# Patient Record
Sex: Male | Born: 1965 | Race: White | Hispanic: No | Marital: Single | Smoking: Former smoker
Health system: Southern US, Community
[De-identification: ages and names within clinical notes are randomized; demographics above are authoritative.]

## PROBLEM LIST (undated history)

## (undated) DIAGNOSIS — M069 Rheumatoid arthritis, unspecified: Secondary | ICD-10-CM

## (undated) DIAGNOSIS — I341 Nonrheumatic mitral (valve) prolapse: Secondary | ICD-10-CM

## (undated) DIAGNOSIS — R011 Cardiac murmur, unspecified: Secondary | ICD-10-CM

## (undated) DIAGNOSIS — I340 Nonrheumatic mitral (valve) insufficiency: Secondary | ICD-10-CM

## (undated) HISTORY — PX: EYE SURGERY: SHX253

## (undated) HISTORY — PX: HERNIA REPAIR: SHX51

## (undated) HISTORY — PX: APPENDECTOMY: SHX54

## (undated) HISTORY — DX: Cardiac murmur, unspecified: R01.1

## (undated) HISTORY — DX: Nonrheumatic mitral (valve) insufficiency: I34.0

## (undated) HISTORY — DX: Rheumatoid arthritis, unspecified: M06.9

## (undated) SURGERY — Surgical Case
Anesthesia: *Unknown

---

## 2013-09-26 DIAGNOSIS — R011 Cardiac murmur, unspecified: Secondary | ICD-10-CM | POA: Insufficient documentation

## 2013-09-26 DIAGNOSIS — H1010 Acute atopic conjunctivitis, unspecified eye: Secondary | ICD-10-CM | POA: Insufficient documentation

## 2013-09-26 DIAGNOSIS — R030 Elevated blood-pressure reading, without diagnosis of hypertension: Secondary | ICD-10-CM | POA: Insufficient documentation

## 2018-11-27 ENCOUNTER — Emergency Department (HOSPITAL_COMMUNITY): Payer: BLUE CROSS/BLUE SHIELD | Admitting: Certified Registered Nurse Anesthetist

## 2018-11-27 ENCOUNTER — Observation Stay (HOSPITAL_COMMUNITY)
Admission: EM | Admit: 2018-11-27 | Discharge: 2018-11-28 | Disposition: A | Discharge status: Home / Self Care | Payer: BLUE CROSS/BLUE SHIELD | Attending: General Surgery | Admitting: General Surgery

## 2018-11-27 ENCOUNTER — Encounter (HOSPITAL_COMMUNITY): Payer: Self-pay

## 2018-11-27 ENCOUNTER — Other Ambulatory Visit: Payer: Self-pay

## 2018-11-27 ENCOUNTER — Emergency Department (HOSPITAL_COMMUNITY): Payer: BLUE CROSS/BLUE SHIELD

## 2018-11-27 ENCOUNTER — Encounter (HOSPITAL_COMMUNITY)
Admission: EM | Disposition: A | Discharge status: Home / Self Care | Payer: Self-pay | Source: Home / Self Care | Attending: Emergency Medicine

## 2018-11-27 DIAGNOSIS — I341 Nonrheumatic mitral (valve) prolapse: Secondary | ICD-10-CM | POA: Diagnosis present

## 2018-11-27 DIAGNOSIS — K3580 Unspecified acute appendicitis: Secondary | ICD-10-CM | POA: Diagnosis present

## 2018-11-27 HISTORY — DX: Nonrheumatic mitral (valve) prolapse: I34.1

## 2018-11-27 HISTORY — PX: LAPAROSCOPIC APPENDECTOMY: SHX408

## 2018-11-27 LAB — URINALYSIS, ROUTINE W REFLEX MICROSCOPIC
Bilirubin Urine: NEGATIVE
Glucose, UA: NEGATIVE mg/dL
Hgb urine dipstick: NEGATIVE
Ketones, ur: 20 mg/dL — AB
Leukocytes,Ua: NEGATIVE
Nitrite: NEGATIVE
Protein, ur: NEGATIVE mg/dL
Specific Gravity, Urine: 1.029 (ref 1.005–1.030)
pH: 7 (ref 5.0–8.0)

## 2018-11-27 LAB — CBC WITH DIFFERENTIAL/PLATELET
Abs Immature Granulocytes: 0.05 10*3/uL (ref 0.00–0.07)
Basophils Absolute: 0 10*3/uL (ref 0.0–0.1)
Basophils Relative: 0 %
Eosinophils Absolute: 0 10*3/uL (ref 0.0–0.5)
Eosinophils Relative: 0 %
HCT: 36.2 % — ABNORMAL LOW (ref 39.0–52.0)
Hemoglobin: 12.4 g/dL — ABNORMAL LOW (ref 13.0–17.0)
Immature Granulocytes: 0 %
Lymphocytes Relative: 10 %
Lymphs Abs: 1.4 10*3/uL (ref 0.7–4.0)
MCH: 31.5 pg (ref 26.0–34.0)
MCHC: 34.3 g/dL (ref 30.0–36.0)
MCV: 91.9 fL (ref 80.0–100.0)
Monocytes Absolute: 0.8 10*3/uL (ref 0.1–1.0)
Monocytes Relative: 6 %
Neutro Abs: 11.5 10*3/uL — ABNORMAL HIGH (ref 1.7–7.7)
Neutrophils Relative %: 84 %
Platelets: 322 10*3/uL (ref 150–400)
RBC: 3.94 MIL/uL — ABNORMAL LOW (ref 4.22–5.81)
RDW: 12.4 % (ref 11.5–15.5)
WBC: 13.7 10*3/uL — ABNORMAL HIGH (ref 4.0–10.5)
nRBC: 0 % (ref 0.0–0.2)

## 2018-11-27 LAB — LIPASE, BLOOD: Lipase: 29 U/L (ref 11–51)

## 2018-11-27 LAB — COMPREHENSIVE METABOLIC PANEL
ALT: 23 U/L (ref 0–44)
AST: 19 U/L (ref 15–41)
Albumin: 4.2 g/dL (ref 3.5–5.0)
Alkaline Phosphatase: 88 U/L (ref 38–126)
Anion gap: 10 (ref 5–15)
BUN: 13 mg/dL (ref 6–20)
CO2: 25 mmol/L (ref 22–32)
Calcium: 9.1 mg/dL (ref 8.9–10.3)
Chloride: 97 mmol/L — ABNORMAL LOW (ref 98–111)
Creatinine, Ser: 0.65 mg/dL (ref 0.61–1.24)
GFR calc Af Amer: >60 mL/min (ref >60)
GFR calc non Af Amer: >60 mL/min (ref >60)
Glucose, Bld: 127 mg/dL — ABNORMAL HIGH (ref 70–99)
Potassium: 3.8 mmol/L (ref 3.5–5.1)
Sodium: 132 mmol/L — ABNORMAL LOW (ref 135–145)
Total Bilirubin: 0.7 mg/dL (ref 0.3–1.2)
Total Protein: 7.3 g/dL (ref 6.5–8.1)

## 2018-11-27 LAB — LACTIC ACID, PLASMA
Lactic Acid, Venous: 1.2 mmol/L (ref 0.5–1.9)
Lactic Acid, Venous: 0.9 mmol/L (ref 0.5–1.9)

## 2018-11-27 SURGERY — APPENDECTOMY, LAPAROSCOPIC
Anesthesia: General | Site: Abdomen

## 2018-11-27 MED ORDER — SUCCINYLCHOLINE CHLORIDE 200 MG/10ML IV SOSY
PREFILLED_SYRINGE | INTRAVENOUS | Status: DC | PRN
  Administered 2018-11-27: 140 mg via INTRAVENOUS

## 2018-11-27 MED ORDER — SUCCINYLCHOLINE CHLORIDE 200 MG/10ML IV SOSY
PREFILLED_SYRINGE | INTRAVENOUS | Status: AC
  Filled 2018-11-27: qty 10

## 2018-11-27 MED ORDER — SODIUM CHLORIDE (PF) 0.9 % IJ SOLN
INTRAMUSCULAR | Status: AC
  Administered 2018-11-27: 22:00:00
  Filled 2018-11-27: qty 100

## 2018-11-27 MED ORDER — GABAPENTIN 300 MG PO CAPS
300.0000 mg | ORAL_CAPSULE | Freq: Once | ORAL | Status: AC
  Administered 2018-11-27: 300 mg via ORAL
  Filled 2018-11-27: qty 1

## 2018-11-27 MED ORDER — ONDANSETRON HCL 4 MG/2ML IJ SOLN
INTRAMUSCULAR | Status: AC
  Filled 2018-11-27: qty 2

## 2018-11-27 MED ORDER — PIPERACILLIN-TAZOBACTAM 3.375 G IVPB 30 MIN
3.3750 g | Freq: Once | INTRAVENOUS | Status: AC
  Administered 2018-11-27: 3.375 g via INTRAVENOUS
  Filled 2018-11-27: qty 50

## 2018-11-27 MED ORDER — DEXAMETHASONE SODIUM PHOSPHATE 10 MG/ML IJ SOLN
INTRAMUSCULAR | Status: AC
  Filled 2018-11-27: qty 1

## 2018-11-27 MED ORDER — HYDROMORPHONE HCL 1 MG/ML IJ SOLN
0.5000 mg | Freq: Once | INTRAMUSCULAR | Status: AC
  Administered 2018-11-27: 0.5 mg via INTRAVENOUS
  Filled 2018-11-27: qty 1

## 2018-11-27 MED ORDER — IOHEXOL 300 MG/ML  SOLN
100.0000 mL | Freq: Once | INTRAMUSCULAR | Status: AC | PRN
  Administered 2018-11-27: 100 mL via INTRAVENOUS

## 2018-11-27 MED ORDER — HYDROMORPHONE HCL 1 MG/ML IJ SOLN
1.0000 mg | Freq: Once | INTRAMUSCULAR | Status: AC
  Administered 2018-11-27: 1 mg via INTRAVENOUS
  Filled 2018-11-27: qty 1

## 2018-11-27 MED ORDER — SODIUM CHLORIDE 0.9 % IV BOLUS (SEPSIS)
1000.0000 mL | Freq: Once | INTRAVENOUS | Status: AC
  Administered 2018-11-27: 1000 mL via INTRAVENOUS

## 2018-11-27 MED ORDER — SODIUM CHLORIDE 0.9 % IV SOLN
1000.0000 mL | INTRAVENOUS | Status: DC
  Administered 2018-11-27: 1000 mL via INTRAVENOUS

## 2018-11-27 MED ORDER — LIDOCAINE 2% (20 MG/ML) 5 ML SYRINGE
INTRAMUSCULAR | Status: AC
  Filled 2018-11-27: qty 5

## 2018-11-27 MED ORDER — FENTANYL CITRATE (PF) 100 MCG/2ML IJ SOLN: 25.0000 ug | INTRAMUSCULAR | Status: DC | PRN

## 2018-11-27 MED ORDER — LACTATED RINGERS IV SOLN
INTRAVENOUS | Status: DC | PRN
  Administered 2018-11-27: 23:00:00 via INTRAVENOUS

## 2018-11-27 MED ORDER — ROCURONIUM BROMIDE 50 MG/5ML IV SOSY
PREFILLED_SYRINGE | INTRAVENOUS | Status: DC | PRN
  Administered 2018-11-27: 60 mg via INTRAVENOUS

## 2018-11-27 MED ORDER — FENTANYL CITRATE (PF) 250 MCG/5ML IJ SOLN
INTRAMUSCULAR | Status: AC
  Filled 2018-11-27: qty 5

## 2018-11-27 MED ORDER — ROCURONIUM BROMIDE 10 MG/ML (PF) SYRINGE
PREFILLED_SYRINGE | INTRAVENOUS | Status: AC
  Filled 2018-11-27: qty 10

## 2018-11-27 MED ORDER — BUPIVACAINE-EPINEPHRINE (PF) 0.25% -1:200000 IJ SOLN
INTRAMUSCULAR | Status: AC
  Filled 2018-11-27: qty 60

## 2018-11-27 MED ORDER — PROPOFOL 10 MG/ML IV BOLUS
INTRAVENOUS | Status: AC
  Filled 2018-11-27: qty 20

## 2018-11-27 MED ORDER — PROPOFOL 10 MG/ML IV BOLUS
INTRAVENOUS | Status: DC | PRN
  Administered 2018-11-27: 150 mg via INTRAVENOUS

## 2018-11-27 MED ORDER — ACETAMINOPHEN 500 MG PO TABS
1000.0000 mg | ORAL_TABLET | Freq: Once | ORAL | Status: AC
  Administered 2018-11-27: 1000 mg via ORAL
  Filled 2018-11-27: qty 2

## 2018-11-27 MED ORDER — MIDAZOLAM HCL 2 MG/2ML IJ SOLN
INTRAMUSCULAR | Status: AC
  Filled 2018-11-27: qty 2

## 2018-11-27 SURGICAL SUPPLY — 34 items
APPLIER CLIP 5 13 M/L LIGAMAX5 (MISCELLANEOUS)
CABLE HIGH FREQUENCY MONO STRZ (ELECTRODE) IMPLANT
CLIP APPLIE 5 13 M/L LIGAMAX5 (MISCELLANEOUS) IMPLANT
CLOSURE WOUND 1/2 X4 (GAUZE/BANDAGES/DRESSINGS)
COVER SURGICAL LIGHT HANDLE (MISCELLANEOUS) ×3 IMPLANT
COVER WAND RF STERILE (DRAPES) IMPLANT
CUTTER FLEX LINEAR 45M (STAPLE) IMPLANT
DECANTER SPIKE VIAL GLASS SM (MISCELLANEOUS) ×3 IMPLANT
GLOVE BIO SURGEON STRL SZ7.5 (GLOVE) ×3 IMPLANT
GLOVE BIOGEL PI IND STRL 7.5 (GLOVE) ×2 IMPLANT
GLOVE BIOGEL PI IND STRL 8 (GLOVE) ×2 IMPLANT
GLOVE BIOGEL PI INDICATOR 7.5 (GLOVE) ×4
GLOVE BIOGEL PI INDICATOR 8 (GLOVE) ×4
GLOVE INDICATOR 8.0 STRL GRN (GLOVE) ×3 IMPLANT
GLOVE SURG SS PI 8.0 STRL IVOR (GLOVE) ×6 IMPLANT
GOWN STRL REUS W/TWL XL LVL3 (GOWN DISPOSABLE) ×9 IMPLANT
GRASPER SUT TROCAR 14GX15 (MISCELLANEOUS) IMPLANT
KIT BASIN OR (CUSTOM PROCEDURE TRAY) ×3 IMPLANT
KIT TURNOVER KIT A (KITS) IMPLANT
POUCH RETRIEVAL ECOSAC 10 (ENDOMECHANICALS) ×1 IMPLANT
POUCH RETRIEVAL ECOSAC 10MM (ENDOMECHANICALS) ×2
RELOAD 45 VASCULAR/THIN (ENDOMECHANICALS) IMPLANT
RELOAD STAPLE TA45 3.5 REG BLU (ENDOMECHANICALS) IMPLANT
SET IRRIG TUBING LAPAROSCOPIC (IRRIGATION / IRRIGATOR) ×3 IMPLANT
SET TUBE SMOKE EVAC HIGH FLOW (TUBING) ×3 IMPLANT
SHEARS HARMONIC ACE PLUS 36CM (ENDOMECHANICALS) ×3 IMPLANT
SLEEVE XCEL OPT CAN 5 100 (ENDOMECHANICALS) ×3 IMPLANT
STRIP CLOSURE SKIN 1/2X4 (GAUZE/BANDAGES/DRESSINGS) IMPLANT
SUT MNCRL AB 4-0 PS2 18 (SUTURE) ×3 IMPLANT
SUT VICRYL 0 TIES 12 18 (SUTURE) IMPLANT
TOWEL OR 17X26 10 PK STRL BLUE (TOWEL DISPOSABLE) ×3 IMPLANT
TRAY LAPAROSCOPIC (CUSTOM PROCEDURE TRAY) ×3 IMPLANT
TROCAR BLADELESS OPT 5 100 (ENDOMECHANICALS) ×3 IMPLANT
TROCAR XCEL BLUNT TIP 100MML (ENDOMECHANICALS) ×3 IMPLANT

## 2018-11-27 NOTE — ED Notes (Signed)
Provided patient urinal to obtain urine sample.

## 2018-11-27 NOTE — ED Triage Notes (Addendum)
Pt stating he woke up this morning with abdominal pain. Pt has had nausea and vomiting throughout the day. No diarrhea. Pt stating pain is more significant on the right side. Took Pepto at 3 with no relief. Pt was seen at an urgent care but was unable to scan the patient so sent to ED.

## 2018-11-27 NOTE — ED Provider Notes (Signed)
[facility]-EMERGENCY DEPT Provider Note   CSN: [id number] Arrival date & time: 11/27/18  1938    History   Chief Complaint Chief Complaint  Patient presents with   Abdominal Pain   Emesis    HPI Perry Lewis is a 53 y.o. male.     HPI Patient referred from urgent care for abdominal pain.  He reports he started getting abdominal pain this morning when he awakened about 730.  First he felt kind of a general aching discomfort and some nausea.  Patient vomited.  Discomfort continued over the course of the day and became more painful with about 2 more episodes of vomiting.  He had one small bowel movement.  No diarrhea.  No relief with discomfort.  No fever.  No cough.  She reports he does have mitral valve prolapse.  He reports that it has had a couple echocardiograms and been shown to be advancing.  She reports that he does get notably short of breath if he tries to exert himself beyond normal activities.  Climbing stairs or aggressive hiking will make him short of breath and sometimes has to rest.  He denies chest pain. Past Medical History:  Diagnosis Date   Mitral valve prolapse     There are no active problems to display for this patient.   Past medical history: Mitral valve prolapse.  Surgical history: Inguinal hernia repair.      Home Medications    Prior to Admission medications   Not on File    Family History No family history on file.  Social History Social History   Tobacco Use   Smoking status: Never Smoker   Smokeless tobacco: Never Used  Substance Use Topics   Alcohol use: Yes   Drug use: Not on file     Allergies   Patient has no known allergies.   Review of Systems Review of Systems 10 Systems reviewed and are negative for acute change except as noted in the HPI.   Physical Exam Updated Vital Signs BP (!) 157/83 (BP Location: Left Arm)    Pulse 97    Temp 99.3 F (37.4 C) (Oral)    Resp 18    Ht 5\' 9"  (1.753  m)    Wt 77.1 kg    SpO2 100%    BMI 25.10 kg/m   Physical Exam Constitutional:      Appearance: He is well-developed.  HENT:     Head: Normocephalic and atraumatic.  Eyes:     Extraocular Movements: Extraocular movements intact.     Conjunctiva/sclera: Conjunctivae normal.  Neck:     Musculoskeletal: Neck supple.  Cardiovascular:     Rate and Rhythm: Normal rate and regular rhythm.     Heart sounds: Murmur present.     Comments: 3 back/6 harsh systolic murmur best over mitral valve. Pulmonary:     Effort: Pulmonary effort is normal.     Comments: No respiratory distress.  Focal pronounced inspiratory wheeze left mid lung field.  Right lung field clear. Abdominal:     General: Bowel sounds are normal. There is no distension.     Palpations: Abdomen is soft.     Tenderness: There is abdominal tenderness.     Comments: Right lower quadrant tender with guarding.  No mass fullness or tenderness in inguinal canals.  Musculoskeletal: Normal range of motion.     Comments: No calf tenderness.  Non-erythematous scattered varicosities of the lower legs.  He had acutely skinned his great  toe was superficial abrasions.  Skin:    General: Skin is warm and dry.  Neurological:     General: No focal deficit present.     Mental Status: He is alert and oriented to person, place, and time.     GCS: GCS eye subscore is 4. GCS verbal subscore is 5. GCS motor subscore is 6.     Coordination: Coordination normal.  Psychiatric:        Mood and Affect: Mood normal.      ED Treatments / Results  Labs (all labs ordered are listed, but only abnormal results are displayed) Labs Reviewed  COMPREHENSIVE METABOLIC PANEL - Abnormal; Notable for the following components:      Result Value   Sodium 132 (*)    Chloride 97 (*)    Glucose, Bld 127 (*)    All other components within normal limits  CBC WITH DIFFERENTIAL/PLATELET - Abnormal; Notable for the following components:   WBC 13.7 (*)    RBC 3.94  (*)    Hemoglobin 12.4 (*)    HCT 36.2 (*)    Neutro Abs 11.5 (*)    All other components within normal limits  LIPASE, BLOOD  LACTIC ACID, PLASMA  LACTIC ACID, PLASMA  URINALYSIS, ROUTINE W REFLEX MICROSCOPIC    EKG None  Radiology Ct Chest W Contrast  Result Date: 11/27/2018 CLINICAL DATA:  Initial evaluation for acute right lower quadrant pain for 1 day, appendicitis suspected. EXAM: CT CHEST, ABDOMEN, AND PELVIS WITH CONTRAST TECHNIQUE: Multidetector CT imaging of the chest, abdomen and pelvis was performed following the standard protocol during bolus administration of intravenous contrast. CONTRAST:  100mL OMNIPAQUE IOHEXOL 300 MG/ML  SOLN COMPARISON:  None available. FINDINGS: CT CHEST FINDINGS Cardiovascular: She intrathoracic aorta normal in caliber without aneurysm or other acute finding. Visualized great vessels within normal limits. Heart size normal. No pericardial effusion. Mild calcification noted about the mitral valve. Limited assessment of the pulmonary arterial tree grossly unremarkable. Mediastinum/Nodes: Thyroid normal. No enlarged mediastinal, hilar, or axillary lymph nodes. Esophagus within normal limits. Lungs/Pleura: Tracheobronchial tree intact and patent. Lungs well inflated bilaterally. Lungs are clear bilaterally without focal infiltrate, edema, or effusion. No pneumothorax. No worrisome pulmonary nodule or mass. Minimal subsegmental atelectatic changes present dependently within the lower lobes bilaterally. Musculoskeletal: External soft tissues within normal limits. No acute osseous finding. No discrete lytic or blastic osseous lesions. CT ABDOMEN PELVIS FINDINGS Hepatobiliary: Liver demonstrates a normal contrast enhanced appearance. Gallbladder within normal limits. No biliary dilatation. Pancreas: Pancreas within normal limits. Spleen: Spleen within normal limits. Adrenals/Urinary Tract: Adrenal glands are normal. Kidneys equal in size with symmetric enhancement. No  nephrolithiasis, hydronephrosis, or focal enhancing renal mass. No hydroureter. Partially distended bladder within normal limits. Stomach/Bowel: Stomach partially distended without acute abnormality. No evidence for bowel obstruction. Appendix: Location: Appendix extends inferiorly from the cecum (series 5, image 59). Diameter: Measures up to 12 mm in diameter. Appendicolith: Possible small appendicolith at the base of the appendix versus enteric contrast (series 5, image 66). Mucosal hyper-enhancement: Present Extraluminal gas: Negative. Periappendiceal collection: Periappendiceal inflammatory stranding with phlegmon without discrete abscess or collection. No other acute inflammatory changes seen about the bowels. Vascular/Lymphatic: Normal intravascular enhancement seen throughout the intra-abdominal aorta. Mesenteric vessels patent proximally. No adenopathy. Reproductive: Prostate and seminal vesicles within normal limits. Other: No free air or fluid. Small fat containing right inguinal hernia noted. Musculoskeletal: No acute osseous finding. Lucent lesion with sclerotic margin adjacent to the right SI joint noted, indeterminate,  but most likely benign. No other aggressive appearing osseous lesions. Thoracolumbar scoliosis noted. IMPRESSION: 1. Findings consistent with acute appendicitis. No complication identified. 2. No other acute abnormality within the chest, abdomen, and pelvis. Electronically Signed   By: Rise MuBenjamin  McClintock M.D.   On: 11/27/2018 22:06   Ct Abdomen Pelvis W Contrast  Result Date: 11/27/2018 CLINICAL DATA:  Initial evaluation for acute right lower quadrant pain for 1 day, appendicitis suspected. EXAM: CT CHEST, ABDOMEN, AND PELVIS WITH CONTRAST TECHNIQUE: Multidetector CT imaging of the chest, abdomen and pelvis was performed following the standard protocol during bolus administration of intravenous contrast. CONTRAST:  100mL OMNIPAQUE IOHEXOL 300 MG/ML  SOLN COMPARISON:  None  available. FINDINGS: CT CHEST FINDINGS Cardiovascular: She intrathoracic aorta normal in caliber without aneurysm or other acute finding. Visualized great vessels within normal limits. Heart size normal. No pericardial effusion. Mild calcification noted about the mitral valve. Limited assessment of the pulmonary arterial tree grossly unremarkable. Mediastinum/Nodes: Thyroid normal. No enlarged mediastinal, hilar, or axillary lymph nodes. Esophagus within normal limits. Lungs/Pleura: Tracheobronchial tree intact and patent. Lungs well inflated bilaterally. Lungs are clear bilaterally without focal infiltrate, edema, or effusion. No pneumothorax. No worrisome pulmonary nodule or mass. Minimal subsegmental atelectatic changes present dependently within the lower lobes bilaterally. Musculoskeletal: External soft tissues within normal limits. No acute osseous finding. No discrete lytic or blastic osseous lesions. CT ABDOMEN PELVIS FINDINGS Hepatobiliary: Liver demonstrates a normal contrast enhanced appearance. Gallbladder within normal limits. No biliary dilatation. Pancreas: Pancreas within normal limits. Spleen: Spleen within normal limits. Adrenals/Urinary Tract: Adrenal glands are normal. Kidneys equal in size with symmetric enhancement. No nephrolithiasis, hydronephrosis, or focal enhancing renal mass. No hydroureter. Partially distended bladder within normal limits. Stomach/Bowel: Stomach partially distended without acute abnormality. No evidence for bowel obstruction. Appendix: Location: Appendix extends inferiorly from the cecum (series 5, image 59). Diameter: Measures up to 12 mm in diameter. Appendicolith: Possible small appendicolith at the base of the appendix versus enteric contrast (series 5, image 66). Mucosal hyper-enhancement: Present Extraluminal gas: Negative. Periappendiceal collection: Periappendiceal inflammatory stranding with phlegmon without discrete abscess or collection. No other acute  inflammatory changes seen about the bowels. Vascular/Lymphatic: Normal intravascular enhancement seen throughout the intra-abdominal aorta. Mesenteric vessels patent proximally. No adenopathy. Reproductive: Prostate and seminal vesicles within normal limits. Other: No free air or fluid. Small fat containing right inguinal hernia noted. Musculoskeletal: No acute osseous finding. Lucent lesion with sclerotic margin adjacent to the right SI joint noted, indeterminate, but most likely benign. No other aggressive appearing osseous lesions. Thoracolumbar scoliosis noted. IMPRESSION: 1. Findings consistent with acute appendicitis. No complication identified. 2. No other acute abnormality within the chest, abdomen, and pelvis. Electronically Signed   By: Rise MuBenjamin  McClintock M.D.   On: 11/27/2018 22:06    Procedures Procedures (including critical care time)  Medications Ordered in ED Medications  sodium chloride 0.9 % bolus 1,000 mL (0 mLs Intravenous Stopped 11/27/18 2145)    Followed by  0.9 %  sodium chloride infusion (1,000 mLs Intravenous New Bag/Given 11/27/18 2051)  piperacillin-tazobactam (ZOSYN) IVPB 3.375 g (has no administration in time range)  acetaminophen (TYLENOL) tablet 1,000 mg (has no administration in time range)  gabapentin (NEURONTIN) capsule 300 mg (has no administration in time range)  HYDROmorphone (DILAUDID) injection 0.5 mg (0.5 mg Intravenous Given 11/27/18 2050)  sodium chloride (PF) 0.9 % injection (  Given by Other 11/27/18 2131)  iohexol (OMNIPAQUE) 300 MG/ML solution 100 mL (100 mLs Intravenous Contrast Given 11/27/18 2131)  HYDROmorphone (  DILAUDID) injection 1 mg (1 mg Intravenous Given 11/27/18 2222)     Initial Impression / Assessment and Plan / ED Course  I have reviewed the triage vital signs and the nursing notes.  Pertinent labs & imaging results that were available during my care of the patient were reviewed by me and considered in my medical decision making (see  chart for details).       Consult: Reviewed with Dr. Andrey Campanile general surgery for admission.  Patient presents with out significant past medical history.  He does have heart murmur with mitral valve prolapse with history of surveillance by echocardiogram.  No other cardiac history and no cardiac medications.  Patient started with abdominal pain this morning.  It became increasingly painful over the course of the day.  CT scan confirms appendicitis.  Plan for admission to general surgery.  Final Clinical Impressions(s) / ED Diagnoses   Final diagnoses:  Acute appendicitis, unspecified acute appendicitis type    ED Discharge Orders    None       Arby Barrette, MD 11/27/18 2242

## 2018-11-27 NOTE — Discharge Instructions (Signed)
[facility] °LAPAROSCOPIC SURGERY: POST OP INSTRUCTIONS °Always review your discharge instruction sheet given to you by the facility where your surgery was performed. °IF YOU HAVE DISABILITY OR FAMILY LEAVE FORMS, YOU MUST BRING THEM TO THE OFFICE FOR PROCESSING.   °DO NOT GIVE THEM TO YOUR DOCTOR. ° °PAIN CONTROL ° °1. First take acetaminophen (Tylenol) AND/or ibuprofen (Advil) to control your pain after surgery.  Follow directions on package.  Taking acetaminophen (Tylenol) and/or ibuprofen (Advil) regularly after surgery will help to control your pain and lower the amount of prescription pain medication you may need.  You should not take more than 3,000 mg (3 grams) of acetaminophen (Tylenol) in 24 hours.  You should not take ibuprofen (Advil), aleve, motrin, naprosyn or other NSAIDS if you have a history of stomach ulcers or chronic kidney disease.  °2. A prescription for pain medication may be given to you upon discharge.  Take your pain medication as prescribed, if you still have uncontrolled pain after taking acetaminophen (Tylenol) or ibuprofen (Advil). °3. Use ice packs to help control pain. °4. If you need a refill on your pain medication, please contact your pharmacy.  They will contact our office to request authorization. Prescriptions will not be filled after 5pm or on week-ends. ° °HOME MEDICATIONS °5. Take your usually prescribed medications unless otherwise directed. ° °DIET °6. You should follow a light diet the first few days after arrival home.  Be sure to include lots of fluids daily. Avoid fatty, fried foods.  ° °CONSTIPATION °7. It is common to experience some constipation after surgery and if you are taking pain medication.  Increasing fluid intake and taking a stool softener (such as Colace) will usually help or prevent this problem from occurring.  A mild laxative (Milk of Magnesia or Miralax) should be taken according to package instructions if there are no bowel  movements after 48 hours. ° °WOUND/INCISION CARE °8. Most patients will experience some swelling and bruising in the area of the incisions.  Ice packs will help.  Swelling and bruising can take several days to resolve.  °9. Unless discharge instructions indicate otherwise, follow guidelines below  °a. STERI-STRIPS - you may remove your outer bandages 48 hours after surgery, and you may shower at that time.  You have steri-strips (small skin tapes) in place directly over the incision.  These strips should be left on the skin for 7-10 days.   °b. DERMABOND/SKIN GLUE - you may shower in 24 hours.  The glue will flake off over the next 2-3 weeks. °10. Any sutures or staples will be removed at the office during your follow-up visit. ° °ACTIVITIES °11. You may resume regular (light) daily activities beginning the next day--such as daily self-care, walking, climbing stairs--gradually increasing activities as tolerated.  You may have sexual intercourse when it is comfortable.  Refrain from any heavy lifting or straining until approved by your doctor. °a. You may drive when you are no longer taking prescription pain medication, you can comfortably wear a seatbelt, and you can safely maneuver your car and apply brakes. ° °FOLLOW-UP °12. You should see your doctor in the office for a follow-up appointment approximately 2-3 weeks after your surgery.  You should have been given your post-op/follow-up appointment when your surgery was scheduled.  If you did not receive a post-op/follow-up appointment, make sure that you call for this appointment within a day or two after you arrive home to insure a convenient appointment time. ° °OTHER   INSTRUCTIONS °13. DO NOT LIFT/PUSH/PULL ANYTHING GREATER THAN 10 LBS FOR 2 WEEKS ° °WHEN TO CALL YOUR DOCTOR: °1. Fever over 101.0 °2. Inability to urinate °3. Continued bleeding from incision. °4. Increased pain, redness, or drainage from the incision. °5. Increasing abdominal pain ° °The clinic  staff is available to answer your questions during regular business hours.  Please don’t hesitate to call and ask to speak to one of the nurses for clinical concerns.  If you have a medical emergency, go to the nearest emergency room or call 911.  A surgeon from [facility] is always on call at the hospital. °[address], [address], [place], [place]  [address] ? [address] [address], , [place]   [address] °[phone] ? [phone] ? FAX [phone] °Web site: [email or web address] ° °••••••••• ° ° °Managing Your Pain After Surgery Without Opioids ° ° ° °Thank you for participating in our program to help patients manage their pain after surgery without opioids. This is part of our effort to provide you with the best care possible, without exposing you or your family to the risk that opioids pose. ° °What pain can I expect after surgery? °You can expect to have some pain after surgery. This is normal. The pain is typically worse the day after surgery, and quickly begins to get better. °Many studies have found that many patients are able to manage their pain after surgery with Over-the-Counter (OTC) medications such as Tylenol and Motrin. If you have a condition that does not allow you to take Tylenol or Motrin, notify your surgical team. ° °How will I manage my pain? °The best strategy for controlling your pain after surgery is around the clock pain control with Tylenol (acetaminophen) and Motrin (ibuprofen or Advil). Alternating these medications with each other allows you to maximize your pain control. In addition to Tylenol and Motrin, you can use heating pads or ice packs on your incisions to help reduce your pain. ° °How will I alternate your regular strength over-the-counter pain medication? °You will take a dose of pain medication every three hours. °; Start by taking 650 mg of Tylenol (2 pills of 325 mg) °; 3 hours later take 600 mg of Motrin (3 pills of 200 mg) °; 3 hours  after taking the Motrin take 650 mg of Tylenol °; 3 hours after that take 600 mg of Motrin. ° ° °- 1 - ° °See example - if your first dose of Tylenol is at 12:00 PM ° ° °12:00 PM Tylenol 650 mg (2 pills of 325 mg)  °3:00 PM Motrin 600 mg (3 pills of 200 mg)  °6:00 PM Tylenol 650 mg (2 pills of 325 mg)  °9:00 PM Motrin 600 mg (3 pills of 200 mg)  °Continue alternating every 3 hours  ° °We recommend that you follow this schedule around-the-clock for at least 3 days after surgery, or until you feel that it is no longer needed. Use the table on the last page of this handout to keep track of the medications you are taking. °Important: °Do not take more than 3000mg of Tylenol or 2300mg of Motrin in a 24-hour period. °Do not take ibuprofen/Motrin if you have a history of bleeding stomach ulcers, severe kidney disease, &/or actively taking a blood thinner ° °What if I still have pain? °If you have pain that is not controlled with the over-the-counter pain medications (Tylenol and Motrin or Advil) you might have what we call “breakthrough” pain. You will   receive a prescription for a small amount of an opioid pain medication such as Oxycodone, Tramadol, or Tylenol with Codeine. Use these opioid pills in the first 24 hours after surgery if you have breakthrough pain. Do not take more than 1 pill every 4-6 hours. ° °If you still have uncontrolled pain after using all opioid pills, don't hesitate to call our staff using the number provided. We will help make sure you are managing your pain in the best way possible, and if necessary, we can provide a prescription for additional pain medication. ° ° °Day 1   ° °Time  °Name of Medication Number of pills taken  °Amount of Acetaminophen  °Pain Level  ° °Comments  °AM PM       °AM PM       °AM PM       °AM PM       °AM PM       °AM PM       °AM PM       °AM PM       °Total Daily amount of Acetaminophen °Do not take more than  3,000 mg per day    ° ° °Day 2   ° °Time  °Name of  Medication Number of pills °taken  °Amount of Acetaminophen  °Pain Level  ° °Comments  °AM PM       °AM PM       °AM PM       °AM PM       °AM PM       °AM PM       °AM PM       °AM PM       °Total Daily amount of Acetaminophen °Do not take more than  3,000 mg per day    ° ° °Day 3   ° °Time  °Name of Medication Number of pills taken  °Amount of Acetaminophen  °Pain Level  ° °Comments  °AM PM       °AM PM       °AM PM       °AM PM       ° ° ° °AM PM       °AM PM       °AM PM       °AM PM       °Total Daily amount of Acetaminophen °Do not take more than  3,000 mg per day    ° ° °Day 4   ° °Time  °Name of Medication Number of pills taken  °Amount of Acetaminophen  °Pain Level  ° °Comments  °AM PM       °AM PM       °AM PM       °AM PM       °AM PM       °AM PM       °AM PM       °AM PM       °Total Daily amount of Acetaminophen °Do not take more than  3,000 mg per day    ° ° °Day 5   ° °Time  °Name of Medication Number °of pills taken  °Amount of Acetaminophen  °Pain Level  ° °Comments  °AM PM       °AM PM       °AM PM       °AM PM       °AM PM       °  AM PM       °AM PM       °AM PM       °Total Daily amount of Acetaminophen °Do not take more than  3,000 mg per day    ° ° ° °Day 6   ° °Time  °Name of Medication Number of pills °taken  °Amount of Acetaminophen  °Pain Level  °Comments  °AM PM       °AM PM       °AM PM       °AM PM       °AM PM       °AM PM       °AM PM       °AM PM       °Total Daily amount of Acetaminophen °Do not take more than  3,000 mg per day    ° ° °Day 7   ° °Time  °Name of Medication Number of pills taken  °Amount of Acetaminophen  °Pain Level  ° °Comments  °AM PM       °AM PM       °AM PM       °AM PM       °AM PM       °AM PM       °AM PM       °AM PM       °Total Daily amount of Acetaminophen °Do not take more than  3,000 mg per day    ° ° ° ° °For additional information about how and where to safely dispose of unused opioid °medications - https://www.[employer]ful[employer].org ° °Disclaimer: This  document contains information and/or instructional materials adapted from [facility] for the typical patient with your condition. It does not replace medical advice from your health care provider because your experience may differ from that of the °typical patient. Talk to your health care provider if you have any questions about this °document, your condition or your treatment plan. °Adapted from [facility] ° ° °

## 2018-11-27 NOTE — Anesthesia Preprocedure Evaluation (Addendum)
Anesthesia Evaluation  Patient identified by MRN, date of birth, ID band Patient awake    Reviewed: Allergy & Precautions, NPO status , Patient's Chart, lab work & pertinent test results  Airway Mallampati: III  TM Distance: >3 FB Neck ROM: Full  Mouth opening: Limited Mouth Opening  Dental no notable dental hx. (+) Teeth Intact, Dental Advisory Given   Pulmonary neg pulmonary ROS,    Pulmonary exam normal breath sounds clear to auscultation       Cardiovascular Normal cardiovascular exam+ Valvular Problems/Murmurs MVP  Rhythm:Regular Rate:Normal     Neuro/Psych negative neurological ROS  negative psych ROS   GI/Hepatic negative GI ROS, Neg liver ROS,   Endo/Other  negative endocrine ROS  Renal/GU negative Renal ROS  negative genitourinary   Musculoskeletal negative musculoskeletal ROS (+)   Abdominal   Peds  Hematology negative hematology ROS (+)   Anesthesia Other Findings   Reproductive/Obstetrics                           Anesthesia Physical Anesthesia Plan  ASA: II and emergent  Anesthesia Plan: General   Post-op Pain Management:    Induction: Intravenous, Rapid sequence and Cricoid pressure planned  PONV Risk Score and Plan: 2 and Ondansetron, Dexamethasone and Midazolam  Airway Management Planned: Oral ETT  Additional Equipment:   Intra-op Plan:   Post-operative Plan: Extubation in OR  Informed Consent: I have reviewed the patients History and Physical, chart, labs and discussed the procedure including the risks, benefits and alternatives for the proposed anesthesia with the patient or authorized representative who has indicated his/her understanding and acceptance.     Dental advisory given  Plan Discussed with: CRNA  Anesthesia Plan Comments:        Anesthesia Quick Evaluation

## 2018-11-27 NOTE — ED Notes (Signed)
Patient transported to CT 

## 2018-11-27 NOTE — ED Notes (Signed)
Medications administered as ordered with sips of water. Surgeon at bedside.

## 2018-11-27 NOTE — H&P (Signed)
Perry Lewis is an 53 y.o. male.   Chief Complaint: abdominal pain HPI: 53 year old male history of mitral valve prolapse awoke this morning with lower abdominal discomfort which was soon followed by nausea and vomiting.  He also had chills but no fever.  He was feeling well on Monday.  The nausea and vomiting persisted with 2 more episodes.  The pain also worsened.  He tried Pepto-Bismol without relief.  He went to [facility] urgent care and was recommended to go to the emergency room but declined.  Because his symptoms continued to worsen and not get better he decided to come to the emergency room.  He reports prior laparoscopic inguinal hernia repair bilaterally 3 years ago.  He answered negative to COVID screening questions  Has a history of hypertriglyceridemia.  He does not smoke.  He has several cocktails per night but denies any history of withdrawals or seizures  Past Medical History:  Diagnosis Date  . Mitral valve prolapse     Past Surgical History:  Procedure Laterality Date  . HERNIA REPAIR      No family history on file. Social History:  reports that he has never smoked. He has never used smokeless tobacco. He reports current alcohol use. No history on file for drug.  Allergies: No Known Allergies  (Not in a hospital admission)   Results for orders placed or performed during the hospital encounter of 11/27/18 (from the past 48 hour(s))  Urinalysis, Routine w reflex microscopic     Status: Abnormal   Collection Time: 11/27/18  7:54 PM  Result Value Ref Range   Color, Urine YELLOW YELLOW   APPearance CLEAR CLEAR   Specific Gravity, Urine 1.029 1.005 - 1.030   pH 7.0 5.0 - 8.0   Glucose, UA NEGATIVE NEGATIVE mg/dL   Hgb urine dipstick NEGATIVE NEGATIVE   Bilirubin Urine NEGATIVE NEGATIVE   Ketones, ur 20 (A) NEGATIVE mg/dL   Protein, ur NEGATIVE NEGATIVE mg/dL   Nitrite NEGATIVE NEGATIVE   Leukocytes,Ua NEGATIVE NEGATIVE    Comment: Performed at [facility], [address], [place], [place] [address]  Comprehensive metabolic panel     Status: Abnormal   Collection Time: 11/27/18  8:43 PM  Result Value Ref Range   Sodium 132 (L) 135 - 145 mmol/L   Potassium 3.8 3.5 - 5.1 mmol/L   Chloride 97 (L) 98 - 111 mmol/L   CO2 25 22 - 32 mmol/L   Glucose, Bld 127 (H) 70 - 99 mg/dL   BUN 13 6 - 20 mg/dL   Creatinine, Ser 1.97 0.61 - 1.24 mg/dL   Calcium 9.1 8.9 - 58.8 mg/dL   Total Protein 7.3 6.5 - 8.1 g/dL   Albumin 4.2 3.5 - 5.0 g/dL   AST 19 15 - 41 U/L   ALT 23 0 - 44 U/L   Alkaline Phosphatase 88 38 - 126 U/L   Total Bilirubin 0.7 0.3 - 1.2 mg/dL   GFR calc non Af Amer >60 >60 mL/min   GFR calc Af Amer >60 >60 mL/min   Anion gap 10 5 - 15    Comment: Performed at [facility], [address], [place], [place] [address]  Lipase, blood     Status: None   Collection Time: 11/27/18  8:43 PM  Result Value Ref Range   Lipase 29 11 - 51 U/L    Comment: Performed at [facility], [address], [place], [place] [address]  CBC with  Differential     Status: Abnormal   Collection Time: 11/27/18  8:43 PM  Result Value Ref Range   WBC 13.7 (H) 4.0 - 10.5 K/uL   RBC 3.94 (L) 4.22 - 5.81 MIL/uL   Hemoglobin 12.4 (L) 13.0 - 17.0 g/dL   HCT 81.136.2 (L) 91.439.0 - 78.252.0 %   MCV 91.9 80.0 - 100.0 fL   MCH 31.5 26.0 - 34.0 pg   MCHC 34.3 30.0 - 36.0 g/dL   RDW 95.612.4 21.311.5 - 08.615.5 %   Platelets 322 150 - 400 K/uL   nRBC 0.0 0.0 - 0.2 %   Neutrophils Relative % 84 %   Neutro Abs 11.5 (H) 1.7 - 7.7 K/uL   Lymphocytes Relative 10 %   Lymphs Abs 1.4 0.7 - 4.0 K/uL   Monocytes Relative 6 %   Monocytes Absolute 0.8 0.1 - 1.0 K/uL   Eosinophils Relative 0 %   Eosinophils Absolute 0.0 0.0 - 0.5 K/uL   Basophils Relative 0 %   Basophils Absolute 0.0 0.0 - 0.1 K/uL   Immature Granulocytes 0 %   Abs Immature Granulocytes 0.05 0.00 - 0.07 K/uL    Comment: Performed at [facility], [address] [place], [place] [address]  Lactic acid, plasma     Status: None   Collection Time: 11/27/18  8:43 PM  Result Value Ref Range   Lactic Acid, Venous 1.2 0.5 - 1.9 mmol/L    Comment: Performed at [facility], [address] [place], [place] [address]  Lactic acid, plasma     Status: None   Collection Time: 11/27/18  9:54 PM  Result Value Ref Range   Lactic Acid, Venous 0.9 0.5 - 1.9 mmol/L    Comment: Performed at [facility], [address] [place], [place] [address]   Ct Chest W Contrast  Result Date: 11/27/2018 CLINICAL DATA:  Initial evaluation for acute right lower quadrant pain for 1 day, appendicitis suspected. EXAM: CT CHEST, ABDOMEN, AND PELVIS WITH CONTRAST TECHNIQUE: Multidetector CT imaging of the chest, abdomen and pelvis was performed following the standard protocol during bolus administration of intravenous contrast. CONTRAST:  100mL OMNIPAQUE IOHEXOL 300 MG/ML  SOLN COMPARISON:  None available. FINDINGS: CT CHEST FINDINGS Cardiovascular: She intrathoracic aorta normal in caliber without aneurysm or other acute finding. Visualized great vessels within normal limits. Heart size normal. No pericardial effusion. Mild calcification noted about the mitral valve. Limited assessment of the pulmonary arterial tree grossly unremarkable. Mediastinum/Nodes: Thyroid normal. No enlarged mediastinal, hilar, or axillary lymph nodes. Esophagus within normal limits. Lungs/Pleura: Tracheobronchial tree intact and patent. Lungs well inflated bilaterally. Lungs are clear bilaterally without focal infiltrate, edema, or effusion. No pneumothorax. No worrisome pulmonary nodule or mass. Minimal subsegmental atelectatic changes present dependently within the lower lobes bilaterally. Musculoskeletal: External soft tissues within normal limits. No acute osseous finding. No discrete lytic or blastic osseous lesions. CT ABDOMEN PELVIS FINDINGS Hepatobiliary: Liver  demonstrates a normal contrast enhanced appearance. Gallbladder within normal limits. No biliary dilatation. Pancreas: Pancreas within normal limits. Spleen: Spleen within normal limits. Adrenals/Urinary Tract: Adrenal glands are normal. Kidneys equal in size with symmetric enhancement. No nephrolithiasis, hydronephrosis, or focal enhancing renal mass. No hydroureter. Partially distended bladder within normal limits. Stomach/Bowel: Stomach partially distended without acute abnormality. No evidence for bowel obstruction. Appendix: Location: Appendix extends inferiorly from the cecum (series 5, image 59). Diameter: Measures up to 12 mm in diameter. Appendicolith: Possible small appendicolith at the base of  the appendix versus enteric contrast (series 5, image 66). Mucosal hyper-enhancement: Present Extraluminal gas: Negative. Periappendiceal collection: Periappendiceal inflammatory stranding with phlegmon without discrete abscess or collection. No other acute inflammatory changes seen about the bowels. Vascular/Lymphatic: Normal intravascular enhancement seen throughout the intra-abdominal aorta. Mesenteric vessels patent proximally. No adenopathy. Reproductive: Prostate and seminal vesicles within normal limits. Other: No free air or fluid. Small fat containing right inguinal hernia noted. Musculoskeletal: No acute osseous finding. Lucent lesion with sclerotic margin adjacent to the right SI joint noted, indeterminate, but most likely benign. No other aggressive appearing osseous lesions. Thoracolumbar scoliosis noted. IMPRESSION: 1. Findings consistent with acute appendicitis. No complication identified. 2. No other acute abnormality within the chest, abdomen, and pelvis. Electronically Signed   By: Rise Mu M.D.   On: 11/27/2018 22:06   Ct Abdomen Pelvis W Contrast  Result Date: 11/27/2018 CLINICAL DATA:  Initial evaluation for acute right lower quadrant pain for 1 day, appendicitis suspected. EXAM:  CT CHEST, ABDOMEN, AND PELVIS WITH CONTRAST TECHNIQUE: Multidetector CT imaging of the chest, abdomen and pelvis was performed following the standard protocol during bolus administration of intravenous contrast. CONTRAST:  OMNIPAQUE IOHEXOL 300 MG/ML  SOLN COMPARISON:  None available. FINDINGS: CT CHEST FINDINGS Cardiovascular: She intrathoracic aorta normal in caliber without aneurysm or other acute finding. Visualized great vessels within normal limits. Heart size normal. No pericardial effusion. Mild calcification noted about the mitral valve. Limited assessment of the pulmonary arterial tree grossly unremarkable. Mediastinum/Nodes: Thyroid normal. No enlarged mediastinal, hilar, or axillary lymph nodes. Esophagus within normal limits. Lungs/Pleura: Tracheobronchial tree intact and patent. Lungs well inflated bilaterally. Lungs are clear bilaterally without focal infiltrate, edema, or effusion. No pneumothorax. No worrisome pulmonary nodule or mass. Minimal subsegmental atelectatic changes present dependently within the lower lobes bilaterally. Musculoskeletal: External soft tissues within normal limits. No acute osseous finding. No discrete lytic or blastic osseous lesions. CT ABDOMEN PELVIS FINDINGS Hepatobiliary: Liver demonstrates a normal contrast enhanced appearance. Gallbladder within normal limits. No biliary dilatation. Pancreas: Pancreas within normal limits. Spleen: Spleen within normal limits. Adrenals/Urinary Tract: Adrenal glands are normal. Kidneys equal in size with symmetric enhancement. No nephrolithiasis, hydronephrosis, or focal enhancing renal mass. No hydroureter. Partially distended bladder within normal limits. Stomach/Bowel: Stomach partially distended without acute abnormality. No evidence for bowel obstruction. Appendix: Location: Appendix extends inferiorly from the cecum (series 5, image 59). Diameter: Measures up to 12 mm in diameter. Appendicolith: Possible small appendicolith  at the base of the appendix versus enteric contrast (series 5, image 66). Mucosal hyper-enhancement: Present Extraluminal gas: Negative. Periappendiceal collection: Periappendiceal inflammatory stranding with phlegmon without discrete abscess or collection. No other acute inflammatory changes seen about the bowels. Vascular/Lymphatic: Normal intravascular enhancement seen throughout the intra-abdominal aorta. Mesenteric vessels patent proximally. No adenopathy. Reproductive: Prostate and seminal vesicles within normal limits. Other: No free air or fluid. Small fat containing right inguinal hernia noted. Musculoskeletal: No acute osseous finding. Lucent lesion with sclerotic margin adjacent to the right SI joint noted, indeterminate, but most likely benign. No other aggressive appearing osseous lesions. Thoracolumbar scoliosis noted. IMPRESSION: 1. Findings consistent with acute appendicitis. No complication identified. 2. No other acute abnormality within the chest, abdomen, and pelvis. Electronically Signed   By: Rise Mu M.D.   On: 11/27/2018 22:06    Review of Systems  Constitutional: Positive for chills. Negative for fever and weight loss.  HENT: Negative for nosebleeds.   Eyes: Negative for blurred vision.  Respiratory: Negative for shortness of breath.  Cardiovascular: Negative for chest pain, palpitations, orthopnea and PND.       Denies DOE  Gastrointestinal: Positive for abdominal pain, nausea and vomiting.  Genitourinary: Negative for dysuria and hematuria.  Musculoskeletal: Negative.   Skin: Negative for itching and rash.  Neurological: Negative for dizziness, focal weakness, seizures, loss of consciousness and headaches.       Denies TIAs, amaurosis fugax  Endo/Heme/Allergies: Does not bruise/bleed easily.  Psychiatric/Behavioral: The patient is not nervous/anxious.     Blood pressure (!) 157/83, pulse 97, temperature 99.3 F (37.4 C), temperature source Oral, resp. rate  18, height 5\' 9"  (1.753 m), weight 77.1 kg, SpO2 100 %. Physical Exam  Vitals reviewed. Constitutional: He is oriented to person, place, and time. He appears well-developed and well-nourished. No distress.  HENT:  Head: Normocephalic and atraumatic.  Right Ear: External ear normal.  Left Ear: External ear normal.  Eyes: Conjunctivae are normal. No scleral icterus.  Neck: Normal range of motion. Neck supple. No tracheal deviation present. No thyromegaly present.  Cardiovascular: Normal rate.  Murmur heard. Respiratory: Effort normal and breath sounds normal. No stridor. No respiratory distress. He has no wheezes.  GI: Soft. He exhibits no distension. There is abdominal tenderness in the right lower quadrant. There is no rigidity and no rebound. No hernia.  Old trocar scars; +RLQ TTP  Musculoskeletal:        General: No tenderness or edema.  Lymphadenopathy:    He has no cervical adenopathy.  Neurological: He is alert and oriented to person, place, and time. He exhibits normal muscle tone.  Skin: Skin is warm and dry. No rash noted. He is not diaphoretic. No erythema. No pallor.  Psychiatric: He has a normal mood and affect. His behavior is normal. Judgment and thought content normal.     Assessment/Plan Acute appendicitis Alcohol use Hypertriglyceridemia  We discussed the etiology and management of acute appendicitis. We discussed operative and nonoperative management.  I recommended operative management along with IV antibiotics.  We discussed laparoscopic appendectomy. We discussed the risk and benefits of surgery including but not limited to bleeding, infection, injury to surrounding structures, need to convert to an open procedure, blood clot formation, post operative abscess or wound infection, staple line complications such as leak or bleeding, hernia formation, post operative ileus, need for additional procedures, anesthesia complications, and the typical postoperative course.  I explained that the patient should expect a good improvement in their symptoms.   preop tylenol & gabapentin SCDs IV abx  Mary SellaEric M. Andrey CampanileWilson, MD, FACS General, Bariatric, & Minimally Invasive Surgery [facility], [place]   Gaynelle AduEric Rayne Loiseau, MD 11/27/2018, 11:18 PM

## 2018-11-28 ENCOUNTER — Encounter (HOSPITAL_COMMUNITY): Payer: Self-pay | Admitting: General Surgery

## 2018-11-28 DIAGNOSIS — I341 Nonrheumatic mitral (valve) prolapse: Secondary | ICD-10-CM | POA: Diagnosis present

## 2018-11-28 DIAGNOSIS — K3580 Unspecified acute appendicitis: Secondary | ICD-10-CM

## 2018-11-28 HISTORY — DX: Unspecified acute appendicitis: K35.80

## 2018-11-28 MED ORDER — ONDANSETRON 4 MG PO TBDP: 4.0000 mg | ORAL_TABLET | Freq: Four times a day (QID) | ORAL | Status: DC | PRN

## 2018-11-28 MED ORDER — ONDANSETRON HCL 4 MG/2ML IJ SOLN: 4.0000 mg | Freq: Four times a day (QID) | INTRAMUSCULAR | Status: DC | PRN

## 2018-11-28 MED ORDER — DIPHENHYDRAMINE HCL 12.5 MG/5ML PO ELIX: 12.5000 mg | ORAL_SOLUTION | Freq: Four times a day (QID) | ORAL | Status: DC | PRN

## 2018-11-28 MED ORDER — KETOROLAC TROMETHAMINE 15 MG/ML IJ SOLN
INTRAMUSCULAR | Status: DC | PRN
  Administered 2018-11-28: 15 mg via INTRAVENOUS

## 2018-11-28 MED ORDER — ACETAMINOPHEN 500 MG PO TABS
1000.0000 mg | ORAL_TABLET | Freq: Four times a day (QID) | ORAL | Status: DC
  Administered 2018-11-28: 1000 mg via ORAL
  Filled 2018-11-28: qty 2

## 2018-11-28 MED ORDER — HYDRALAZINE HCL 20 MG/ML IJ SOLN: 10.0000 mg | INTRAMUSCULAR | Status: DC | PRN

## 2018-11-28 MED ORDER — PANTOPRAZOLE SODIUM 40 MG IV SOLR: 40.0000 mg | Freq: Every day | INTRAVENOUS | Status: DC

## 2018-11-28 MED ORDER — ZOLPIDEM TARTRATE 5 MG PO TABS: 5.0000 mg | ORAL_TABLET | Freq: Every evening | ORAL | Status: DC | PRN

## 2018-11-28 MED ORDER — BUPIVACAINE-EPINEPHRINE 0.25% -1:200000 IJ SOLN
INTRAMUSCULAR | Status: DC | PRN
  Administered 2018-11-28: 16 mL

## 2018-11-28 MED ORDER — MORPHINE SULFATE (PF) 2 MG/ML IV SOLN: 1.0000 mg | INTRAVENOUS | Status: DC | PRN

## 2018-11-28 MED ORDER — OXYCODONE HCL 5 MG PO TABS: 5.0000 mg | ORAL_TABLET | Freq: Four times a day (QID) | ORAL | 0 refills | Status: DC | PRN

## 2018-11-28 MED ORDER — KCL IN DEXTROSE-NACL 20-5-0.45 MEQ/L-%-% IV SOLN
INTRAVENOUS | Status: DC
  Administered 2018-11-28: 01:00:00 via INTRAVENOUS

## 2018-11-28 MED ORDER — ENOXAPARIN SODIUM 40 MG/0.4ML ~~LOC~~ SOLN: 40.0000 mg | SUBCUTANEOUS | Status: DC

## 2018-11-28 MED ORDER — FENTANYL CITRATE (PF) 100 MCG/2ML IJ SOLN: INTRAMUSCULAR | Status: DC | PRN

## 2018-11-28 MED ORDER — DIPHENHYDRAMINE HCL 50 MG/ML IJ SOLN: 12.5000 mg | Freq: Four times a day (QID) | INTRAMUSCULAR | Status: DC | PRN

## 2018-11-28 MED ORDER — IBUPROFEN 200 MG PO TABS: ORAL_TABLET | ORAL | Status: DC

## 2018-11-28 MED ORDER — SIMETHICONE 80 MG PO CHEW: 40.0000 mg | CHEWABLE_TABLET | Freq: Four times a day (QID) | ORAL | Status: DC | PRN

## 2018-11-28 MED ORDER — ACETAMINOPHEN 500 MG PO TABS: ORAL_TABLET | ORAL | 0 refills | Status: DC

## 2018-11-28 MED ORDER — ONDANSETRON HCL 4 MG/2ML IJ SOLN
INTRAMUSCULAR | Status: DC | PRN
  Administered 2018-11-28: 4 mg via INTRAVENOUS

## 2018-11-28 MED ORDER — KCL IN DEXTROSE-NACL 20-5-0.45 MEQ/L-%-% IV SOLN
INTRAVENOUS | Status: AC
  Filled 2018-11-28: qty 1000

## 2018-11-28 MED ORDER — KETOROLAC TROMETHAMINE 30 MG/ML IJ SOLN
30.0000 mg | Freq: Three times a day (TID) | INTRAMUSCULAR | Status: DC
  Administered 2018-11-28: 06:00:00 30 mg via INTRAVENOUS
  Filled 2018-11-28: qty 1

## 2018-11-28 MED ORDER — DEXAMETHASONE SODIUM PHOSPHATE 10 MG/ML IJ SOLN
INTRAMUSCULAR | Status: DC | PRN
  Administered 2018-11-28: 10 mg via INTRAVENOUS

## 2018-11-28 MED ORDER — LIDOCAINE 2% (20 MG/ML) 5 ML SYRINGE
INTRAMUSCULAR | Status: DC | PRN
  Administered 2018-11-27: 80 mg via INTRAVENOUS

## 2018-11-28 MED ORDER — LACTATED RINGERS IR SOLN
Status: DC | PRN
  Administered 2018-11-28: 1000 mL

## 2018-11-28 MED ORDER — SUGAMMADEX SODIUM 200 MG/2ML IV SOLN
INTRAVENOUS | Status: DC | PRN
  Administered 2018-11-28: 150 mg via INTRAVENOUS

## 2018-11-28 MED ORDER — FENTANYL CITRATE (PF) 250 MCG/5ML IJ SOLN
INTRAMUSCULAR | Status: DC | PRN
  Administered 2018-11-27 – 2018-11-28 (×2): 50 ug via INTRAVENOUS

## 2018-11-28 MED ORDER — MIDAZOLAM HCL 5 MG/5ML IJ SOLN
INTRAMUSCULAR | Status: DC | PRN
  Administered 2018-11-27: 2 mg via INTRAVENOUS

## 2018-11-28 MED ORDER — 0.9 % SODIUM CHLORIDE (POUR BTL) OPTIME
TOPICAL | Status: DC | PRN
  Administered 2018-11-28: 1000 mL

## 2018-11-28 MED ORDER — OXYCODONE HCL 5 MG PO TABS
5.0000 mg | ORAL_TABLET | ORAL | Status: DC | PRN
  Administered 2018-11-28: 5 mg via ORAL
  Filled 2018-11-28: qty 1

## 2018-11-28 NOTE — Anesthesia Procedure Notes (Signed)
Procedure Name: Intubation Date/Time: 11/28/2018 11:50 PM Performed by: Wynonia Sours, CRNA Pre-anesthesia Checklist: Patient identified, Emergency Drugs available, Suction available, Patient being monitored and Timeout performed Patient Re-evaluated:Patient Re-evaluated prior to induction Oxygen Delivery Method: Circle system utilized Preoxygenation: Pre-oxygenation with 100% oxygen Induction Type: Rapid sequence and IV induction Laryngoscope Size: 3 and Glidescope Grade View: Grade I Tube type: Oral Tube size: 7.5 mm Number of attempts: 1 Airway Equipment and Method: Video-laryngoscopy and Rigid stylet Placement Confirmation: ETT inserted through vocal cords under direct vision,  positive ETCO2,  CO2 detector and breath sounds checked- equal and bilateral Secured at: 22 cm Tube secured with: Tape Dental Injury: Teeth and Oropharynx as per pre-operative assessment  Comments: Elective glidescope

## 2018-11-28 NOTE — Progress Notes (Signed)
1 Day Post-Op    CC: Abdominal pain  Subjective: Doing well this a.m. tolerating a diet.  He has not been up much and walked yet so we will get him moving some.  Port sites all look fine.  Objective: Vital signs in last 24 hours: Temp:  [98 F (36.7 C)-99.3 F (37.4 C)] 98.5 F (36.9 C) (04/29 0507) Pulse Rate:  [81-111] 81 (04/29 0507) Resp:  [14-18] 18 (04/29 0507) BP: (118-161)/(74-91) 121/80 (04/29 0507) SpO2:  [93 %-100 %] 97 % (04/29 0507) Weight:  [77.1 kg] 77.1 kg (04/28 1947) Last BM Date: 11/25/18 360 p.o. 2500 IV 1200 urine Afebrile vital signs are stable No labs this a.m.   Intake/Output from previous day: 04/28 0701 - 04/29 0700 In: 2992.2 [P.O.:360; I.V.:1582.2; IV Piggyback:1050] Out: 1200 [Urine:1200] Intake/Output this shift: Total I/O In: 120 [P.O.:120] Out: 650 [Urine:650]  General appearance: alert, cooperative and no distress Resp: clear to auscultation bilaterally GI: Soft, sites look fine.  Normal postop soreness.  Lab Results:  Recent Labs    11/27/18 2043  WBC 13.7*  HGB 12.4*  HCT 36.2*  PLT 322    BMET Recent Labs    11/27/18 2043  NA 132*  K 3.8  CL 97*  CO2 25  GLUCOSE 127*  BUN 13  CREATININE 0.65  CALCIUM 9.1   PT/INR No results for input(s): LABPROT, INR in the last 72 hours.  Recent Labs  Lab 11/27/18 2043  AST 19  ALT 23  ALKPHOS 88  BILITOT 0.7  PROT 7.3  ALBUMIN 4.2     Lipase     Component Value Date/Time   LIPASE 29 11/27/2018 2043     Medications: . acetaminophen  1,000 mg Oral Q6H  . [START ON 11/29/2018] enoxaparin (LOVENOX) injection  40 mg Subcutaneous Q24H  . ketorolac  30 mg Intravenous Q8H  . pantoprazole (PROTONIX) IV  40 mg Intravenous QHS   Prior to Admission medications   Not on File   . dextrose 5 % and 0.45 % NaCl with KCl 20 mEq/L 75 mL/hr at 11/28/18 0122    Assessment/Plan Hx mitral valve prolapse  Acute appendicitis Laparoscopic appendectomy 11/28/2018 Dr. Gaynelle Adu  FEN: Regular diet/IV fluids ID: Zosyn preop DVT: Lovenox Follow-up: [facility] 5/14 @ 1:30 PM   Plan: Home this a.m.  Follow-up in the [facility]  Clinic.     LOS: 0 days    Nel Stoneking 11/28/2018 [phone]

## 2018-11-28 NOTE — Op Note (Signed)
Perry Lewis [id number] 10/26/1965 11/28/2018  Appendectomy, Lap, Procedure Note  Indications: The patient presented with a history of right-sided abdominal pain. A CT revealed findings consistent with acute appendicitis.  Pre-operative Diagnosis: Acute appendicitis without mention of peritonitis  Post-operative Diagnosis: Same  Surgeon: Gaynelle Adu MD FACS  Assistants: none  Anesthesia: General endotracheal anesthesia  ASA Class: 2, E  Procedure Details  The patient was seen again in the Holding Room. The risks, benefits, complications, treatment options, and expected outcomes were discussed with the patient and/or family. The possibilities of perforation of viscus, bleeding, recurrent infection, the need for additional procedures, failure to diagnose a condition, and creating a complication requiring transfusion or operation were discussed. There was concurrence with the proposed plan and informed consent was obtained. The site of surgery was properly noted. The patient was taken to Operating Room, identified as Perry Lewis and the procedure verified as Appendectomy. A Time Out was held and the above information confirmed.  The patient was placed in the supine position and general anesthesia was induced, along with placement of orogastric tube, SCDs, and a Foley catheter. The abdomen was prepped and draped in a sterile fashion. A 1.5 centimeter infraumbilical incision was made.  The umbilical stalk was elevated, and the midline fascia was incised with a #11 blade.  A Kelly clamp was used to confirm entrance into the peritoneal cavity.  A pursestring suture was passed around the incision with a 0 Vicryl.  A 45mm Hasson was introduced into the abdomen and the tails of the suture were used to hold the Hasson in place.   The pneumoperitoneum was then established to steady pressure of 15 mmHg.  Additional 5 mm cannulas then placed in the left lower quadrant of the abdomen and the suprapubic  region under direct visualization. A careful evaluation of the entire abdomen was carried out. The patient was placed in Trendelenburg and left lateral decubitus position. The small intestines were retracted in the cephalad and left lateral direction away from the pelvis and right lower quadrant. The patient was found to have an very inflammed appendix and mesoappendix that was extending into the pelvis. There was no evidence of perforation.  The appendix was carefully dissected. The appendix was was skeletonized with the harmonic scalpel.   The appendix was divided at its base using an endo-GIA stapler with a blue load. No appendiceal stump was left in place. There was no evidence of bleeding, leakage, or complication after division of the appendix. Irrigation was also performed and irrigate suctioned from the abdomen as well. The appendix was removed from the abdomen with an Ecco bag through the umbilical port after desufflate the abdomen.   The umbilical port site was closed with the purse string suture. Pneumoperitoneum was re-established. Staple line hemostatic. The closure was viewed laparoscopically. There was no residual palpable fascial defect but I did place an additional interrupted 0 vicryl suture at the umbilical fascia using a PMI suture passer with lap guidance. Local was infiltrated around the umbilicus.  The abdomen was desufflated. The trocar site skin wounds were closed with 4-0 Monocryl. Benzoin, steri-strips and bandages were applied to the skin incisions.  Instrument, sponge, and needle counts were correct at the conclusion of the case.   Findings: The appendix was found to be inflamed. There were not signs of necrosis.  There was not perforation. There was not abscess formation.  Estimated Blood Loss:  Minimal         Drains: none  Specimens: appendix         Complications:  None; patient tolerated the procedure well.         Disposition: PACU - hemodynamically  stable.         Condition: stable  Perry Lewis. Andrey Campanile, MD, FACS General, Bariatric, & Minimally Invasive Surgery [facility], [place]

## 2018-11-28 NOTE — Transfer of Care (Signed)
Immediate Anesthesia Transfer of Care Note  Patient: Perry Lewis  Procedure(s) Performed: APPENDECTOMY LAPAROSCOPIC (N/A Abdomen)  Patient Location: PACU  Anesthesia Type:General  Level of Consciousness: awake, alert , oriented and patient cooperative  Airway & Oxygen Therapy: Patient Spontanous Breathing and Patient connected to face mask oxygen  Post-op Assessment: Report given to RN and Post -op Vital signs reviewed and stable  Post vital signs: Reviewed and stable  Last Vitals:  Vitals Value Taken Time  BP 135/91 11/28/2018  1:16 AM  Temp    Pulse 98 11/28/2018  1:23 AM  Resp 15 11/28/2018  1:23 AM  SpO2 100 % 11/28/2018  1:23 AM  Vitals shown include unvalidated device data.  Last Pain:  Vitals:   11/27/18 2246  TempSrc:   PainSc: 3          Complications: No apparent anesthesia complications

## 2018-11-28 NOTE — Anesthesia Postprocedure Evaluation (Signed)
Anesthesia Post Note  Patient: Perry Lewis  Procedure(s) Performed: APPENDECTOMY LAPAROSCOPIC (N/A Abdomen)     Patient location during evaluation: PACU Anesthesia Type: General Level of consciousness: awake and alert Pain management: pain level controlled Vital Signs Assessment: post-procedure vital signs reviewed and stable Respiratory status: spontaneous breathing, nonlabored ventilation, respiratory function stable and patient connected to nasal cannula oxygen Cardiovascular status: blood pressure returned to baseline and stable Postop Assessment: no apparent nausea or vomiting Anesthetic complications: no    Last Vitals:  Vitals:   11/28/18 0404 11/28/18 0507  BP: 123/76 121/80  Pulse: 92 81  Resp: 18 18  Temp: 36.7 C 36.9 C  SpO2: 98% 97%    Last Pain:  Vitals:   11/28/18 0714  TempSrc:   PainSc: 3                  Danylle Ouk L Kellie Murrill

## 2018-11-28 NOTE — Progress Notes (Signed)
Discharge instructions given to pt and all questions were answered.  

## 2018-11-30 NOTE — Discharge Summary (Signed)
Physician Discharge Summary  Patient ID: Perry Lewis MRN: [id number] DOB/AGE: 1966/01/23 53 y.o.  Admit date: 11/27/2018 Discharge date: 11/29/2018  Admission Diagnoses:  Acute appendicitis History of mitral valve prolapse  Discharge Diagnoses:  Acute appendicitis History of mitral valve prolapse   Principal Problem:   Acute appendicitis s/p lap appendectomy 11/28/2018 Active Problems:   Mitral valve prolapse   PROCEDURES: Laparoscopic appendectomy 11/28/2018 Dr. Doreatha Massed Course:  53 year old male history of mitral valve prolapse awoke this morning with lower abdominal discomfort which was soon followed by nausea and vomiting.  He also had chills but no fever.  He was feeling well on Monday.  The nausea and vomiting persisted with 2 more episodes.  The pain also worsened.  He tried Pepto-Bismol without relief.  He went to [facility] urgent care and was recommended to go to the emergency room but declined.  Because his symptoms continued to worsen and not get better he decided to come to the emergency room.  He reports prior laparoscopic inguinal hernia repair bilaterally 3 years ago. He answered negative to COVID screening questions Has a history of hypertriglyceridemia. He does not smoke. He has several cocktails per night but denies any history   He was seen in the emergency department by Dr. Gaynelle Adu and taken directly to the operating room.  He underwent laparoscopic appendectomy.  Postoperative diagnosis was acute appendicitis without mention of peritonitis.  He did well first postoperative day his diet was advanced he was tolerating a diet, his pain was controlled with oral medications, he was ambulating and voiding without difficulty.  He was ready for discharge the first postoperative day.  CBC Latest Ref Rng & Units 11/27/2018  WBC 4.0 - 10.5 K/uL 13.7(H)  Hemoglobin 13.0 - 17.0 g/dL 12.4(L)  Hematocrit 39.0 - 52.0 % 36.2(L)  Platelets 150 - 400 K/uL 322    CMP Latest Ref Rng & Units 11/27/2018  Glucose 70 - 99 mg/dL 932(T)  BUN 6 - 20 mg/dL 13  Creatinine 5.57 - 3.22 mg/dL 0.25  Sodium 427 - 062 mmol/L 132(L)  Potassium 3.5 - 5.1 mmol/L 3.8  Chloride 98 - 111 mmol/L 97(L)  CO2 22 - 32 mmol/L 25  Calcium 8.9 - 10.3 mg/dL 9.1  Total Protein 6.5 - 8.1 g/dL 7.3  Total Bilirubin 0.3 - 1.2 mg/dL 0.7  Alkaline Phos 38 - 126 U/L 88  AST 15 - 41 U/L 19  ALT 0 - 44 U/L 23    Condition on discharge: Improved  Disposition: Home   Allergies as of 11/28/2018   No Known Allergies     Medication List    TAKE these medications   acetaminophen 500 MG tablet Commonly known as:  TYLENOL Take 1000 mg of Tylenol every 8 hours for pain.  This should be your first medication for pain control.  You can buy this over-the-counter at any drugstore.  Do not exceed 4000 mg of Tylenol per day it can harm your liver.   ibuprofen 200 MG tablet Commonly known as:  ADVIL You can take 2 to 3 tablets every 6 hours as needed for pain not relieved by plain Tylenol.  Is your second medication for pain control.  Do not exceed this dosage.  You can buy this over-the-counter at any drugstore.   oxyCODONE 5 MG immediate release tablet Commonly known as:  Oxy IR/ROXICODONE Take 1 tablet (5 mg total) by mouth every 6 (six) hours as needed for moderate pain, severe pain or breakthrough pain (  Pain not relieved by plain Tylenol and ibuprofen).      Follow-up Information    [facility] Follow up on 12/13/2018.   Specialty:  General Surgery Why:  Your follow up will be a phone call appointment  (due to the [facility] virus, to limit exposure.) Hedda Slade will call you at 1:30 PM.  Please email a picture if you have an concerns with you wound to : [email or web address]  Contact information: [address] [place] [address] [phone]           Signed: Sherrie George 11/30/2018, 3:27 PM

## 2019-01-08 DIAGNOSIS — E781 Pure hyperglyceridemia: Secondary | ICD-10-CM | POA: Insufficient documentation

## 2019-01-08 DIAGNOSIS — I340 Nonrheumatic mitral (valve) insufficiency: Secondary | ICD-10-CM | POA: Insufficient documentation

## 2019-01-08 DIAGNOSIS — I341 Nonrheumatic mitral (valve) prolapse: Secondary | ICD-10-CM | POA: Insufficient documentation

## 2019-02-06 ENCOUNTER — Other Ambulatory Visit: Payer: Self-pay | Admitting: General Surgery

## 2019-02-06 DIAGNOSIS — Z9049 Acquired absence of other specified parts of digestive tract: Secondary | ICD-10-CM

## 2019-02-15 ENCOUNTER — Ambulatory Visit
Admission: RE | Admit: 2019-02-15 | Discharge: 2019-02-15 | Disposition: A | Discharge status: Home / Self Care | Payer: BC Managed Care – PPO | Source: Ambulatory Visit | Attending: General Surgery | Admitting: General Surgery

## 2019-02-15 ENCOUNTER — Other Ambulatory Visit: Payer: Self-pay

## 2019-02-15 DIAGNOSIS — Z9049 Acquired absence of other specified parts of digestive tract: Secondary | ICD-10-CM

## 2019-02-15 MED ORDER — IOPAMIDOL (ISOVUE-300) INJECTION 61%
100.0000 mL | Freq: Once | INTRAVENOUS | Status: AC | PRN
  Administered 2019-02-15: 15:00:00 100 mL via INTRAVENOUS

## 2019-04-17 DIAGNOSIS — J302 Other seasonal allergic rhinitis: Secondary | ICD-10-CM | POA: Insufficient documentation

## 2019-04-17 DIAGNOSIS — I8391 Asymptomatic varicose veins of right lower extremity: Secondary | ICD-10-CM | POA: Insufficient documentation

## 2019-05-28 DIAGNOSIS — M2550 Pain in unspecified joint: Secondary | ICD-10-CM | POA: Diagnosis not present

## 2019-06-05 DIAGNOSIS — M0579 Rheumatoid arthritis with rheumatoid factor of multiple sites without organ or systems involvement: Secondary | ICD-10-CM | POA: Insufficient documentation

## 2019-06-17 DIAGNOSIS — Z79899 Other long term (current) drug therapy: Secondary | ICD-10-CM | POA: Diagnosis not present

## 2019-06-17 DIAGNOSIS — L820 Inflamed seborrheic keratosis: Secondary | ICD-10-CM | POA: Diagnosis not present

## 2019-06-17 DIAGNOSIS — I341 Nonrheumatic mitral (valve) prolapse: Secondary | ICD-10-CM | POA: Diagnosis not present

## 2019-06-17 DIAGNOSIS — M0579 Rheumatoid arthritis with rheumatoid factor of multiple sites without organ or systems involvement: Secondary | ICD-10-CM | POA: Diagnosis not present

## 2019-08-06 DIAGNOSIS — M0579 Rheumatoid arthritis with rheumatoid factor of multiple sites without organ or systems involvement: Secondary | ICD-10-CM | POA: Diagnosis not present

## 2019-08-06 DIAGNOSIS — Z79899 Other long term (current) drug therapy: Secondary | ICD-10-CM | POA: Diagnosis not present

## 2019-08-06 DIAGNOSIS — Z111 Encounter for screening for respiratory tuberculosis: Secondary | ICD-10-CM | POA: Diagnosis not present

## 2019-10-30 DIAGNOSIS — M0579 Rheumatoid arthritis with rheumatoid factor of multiple sites without organ or systems involvement: Secondary | ICD-10-CM | POA: Diagnosis not present

## 2019-10-30 DIAGNOSIS — Z79899 Other long term (current) drug therapy: Secondary | ICD-10-CM | POA: Diagnosis not present

## 2019-11-12 DIAGNOSIS — I517 Cardiomegaly: Secondary | ICD-10-CM | POA: Diagnosis not present

## 2019-11-12 DIAGNOSIS — I081 Rheumatic disorders of both mitral and tricuspid valves: Secondary | ICD-10-CM | POA: Diagnosis not present

## 2020-02-07 DIAGNOSIS — M0579 Rheumatoid arthritis with rheumatoid factor of multiple sites without organ or systems involvement: Secondary | ICD-10-CM | POA: Diagnosis not present

## 2020-02-07 DIAGNOSIS — L570 Actinic keratosis: Secondary | ICD-10-CM | POA: Diagnosis not present

## 2020-02-07 DIAGNOSIS — B351 Tinea unguium: Secondary | ICD-10-CM | POA: Diagnosis not present

## 2020-02-07 DIAGNOSIS — L821 Other seborrheic keratosis: Secondary | ICD-10-CM | POA: Diagnosis not present

## 2020-04-23 DIAGNOSIS — M0579 Rheumatoid arthritis with rheumatoid factor of multiple sites without organ or systems involvement: Secondary | ICD-10-CM | POA: Diagnosis not present

## 2020-04-23 DIAGNOSIS — Z79899 Other long term (current) drug therapy: Secondary | ICD-10-CM | POA: Diagnosis not present

## 2020-05-19 DIAGNOSIS — I340 Nonrheumatic mitral (valve) insufficiency: Secondary | ICD-10-CM | POA: Diagnosis not present

## 2020-05-19 DIAGNOSIS — I341 Nonrheumatic mitral (valve) prolapse: Secondary | ICD-10-CM | POA: Diagnosis not present

## 2020-05-19 DIAGNOSIS — E875 Hyperkalemia: Secondary | ICD-10-CM | POA: Diagnosis not present

## 2020-08-20 DIAGNOSIS — Z79899 Other long term (current) drug therapy: Secondary | ICD-10-CM | POA: Diagnosis not present

## 2020-08-20 DIAGNOSIS — M0579 Rheumatoid arthritis with rheumatoid factor of multiple sites without organ or systems involvement: Secondary | ICD-10-CM | POA: Diagnosis not present

## 2020-08-20 DIAGNOSIS — Z111 Encounter for screening for respiratory tuberculosis: Secondary | ICD-10-CM | POA: Diagnosis not present

## 2020-09-03 DIAGNOSIS — E875 Hyperkalemia: Secondary | ICD-10-CM | POA: Diagnosis not present

## 2020-09-08 ENCOUNTER — Encounter: Payer: Self-pay | Admitting: Thoracic Surgery (Cardiothoracic Vascular Surgery)

## 2020-09-14 ENCOUNTER — Encounter: Payer: BC Managed Care – PPO | Admitting: Thoracic Surgery (Cardiothoracic Vascular Surgery)

## 2020-09-28 ENCOUNTER — Other Ambulatory Visit: Payer: Self-pay

## 2020-09-28 ENCOUNTER — Encounter: Payer: Self-pay | Admitting: Thoracic Surgery (Cardiothoracic Vascular Surgery)

## 2020-09-28 ENCOUNTER — Institutional Professional Consult (permissible substitution) (INDEPENDENT_AMBULATORY_CARE_PROVIDER_SITE_OTHER): Payer: BC Managed Care – PPO | Admitting: Thoracic Surgery (Cardiothoracic Vascular Surgery)

## 2020-09-28 ENCOUNTER — Other Ambulatory Visit: Payer: Self-pay | Admitting: Thoracic Surgery (Cardiothoracic Vascular Surgery)

## 2020-09-28 VITALS — BP 167/88 | HR 99 | Resp 20 | Ht 69.0 in | Wt 170.0 lb

## 2020-09-28 DIAGNOSIS — I341 Nonrheumatic mitral (valve) prolapse: Secondary | ICD-10-CM | POA: Diagnosis not present

## 2020-09-28 DIAGNOSIS — M069 Rheumatoid arthritis, unspecified: Secondary | ICD-10-CM | POA: Insufficient documentation

## 2020-09-28 DIAGNOSIS — I340 Nonrheumatic mitral (valve) insufficiency: Secondary | ICD-10-CM | POA: Insufficient documentation

## 2020-09-28 NOTE — Progress Notes (Signed)
[address]       [name] [address]             [phone]     CARDIOTHORACIC SURGERY CONSULTATION REPORT  Referring Provider is Eartha Inch, MD Primary Cardiologist is Ermalene Searing, MD PCP is Eartha Inch, MD  Chief Complaint  Patient presents with  . Mitral Regurgitation    Initial surgical consult, ECHO 10/19    HPI:  Patient is a 55 year old male with history of mitral valve prolapse, mitral regurgitation and rheumatoid arthritis who has been referred for surgical consultation to discuss treatment options for management of severe primary mitral regurgitation.  Patient states that he was first told he had a heart murmur in 2017.  At the time he lived in [place] and echocardiogram performed there revealed mitral valve prolapse with what was felt to be moderate mitral regurgitation and normal left ventricular systolic function.  Shortly after that he moved to [facility] where he has established health care follow-up with Dr. Cyndia Bent and Dr. Lenon Curt.  Follow-up transthoracic echocardiograms have documented the presence of mitral valve prolapse with mitral regurgitation that has progressed in severity.  Most recent follow-up echocardiogram performed May 19, 2020 revealed severe prolapse involving the middle scallop of the posterior leaflet causing moderate to severe mitral regurgitation.  Left ventricular systolic function remain normal.  Patient has been referred for elective surgical consultation.  Patient is single and lives alone locally in [place].  He is accompanied by his longtime partner who is also the mother of his daughter for his office consultation visit today.  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  He does not exercise at all on a regular basis but he reports no specific physical limitations.  Within the last few years he has been diagnosed with rheumatoid arthritis  for which she has been taking methotrexate for the past year.  This is brought symptoms of arthritis under reasonably good control.  When the patient is not working he does enjoy working around his house and he recently built a Product manager.  He denies any history of symptoms of exertional shortness of breath or chest discomfort.  He has not had any resting shortness of breath, PND, orthopnea, dizzy spells, or syncope.  He reports frequent palpitations that are self-limited.  He has some swelling involving his right lower leg in association with varicose veins.  He does not experience any lower extremity edema on the left side.  Past Medical History:  Diagnosis Date  . Mitral regurgitation   . Mitral valve prolapse   . Rheumatoid arthritis [facility])     Past Surgical History:  Procedure Laterality Date  . HERNIA REPAIR    . LAPAROSCOPIC APPENDECTOMY N/A 11/27/2018   Procedure: APPENDECTOMY LAPAROSCOPIC;  Surgeon: Gaynelle Adu, MD;  Location: [facility] ORS;  Service: General;  Laterality: N/A;    History reviewed. No pertinent family history.  Social History   Socioeconomic History  . Marital status: Single    Spouse name: Not on file  . Number of children: Not on file  . Years of education: Not on file  . Highest education level: Not on file  Occupational History  . Not on file  Tobacco Use  . Smoking status: Never Smoker  . Smokeless tobacco: Never Used  Substance and Sexual Activity  . Alcohol use: Yes  . Drug use: Not on file  . Sexual  activity: Not on file  Other Topics Concern  . Not on file  Social History Narrative  . Not on file   Social Determinants of Health   Financial Resource Strain: Not on file  Food Insecurity: Not on file  Transportation Needs: Not on file  Physical Activity: Not on file  Stress: Not on file  Social Connections: Not on file  Intimate Partner Violence: Not on file    Current Outpatient Medications  Medication Sig Dispense Refill  .  acetaminophen (TYLENOL) 500 MG tablet Take 1000 mg of Tylenol every 8 hours for pain.  This should be your first medication for pain control.  You can buy this over-the-counter at any drugstore.  Do not exceed 4000 mg of Tylenol per day it can harm your liver. 30 tablet 0  . ibuprofen (ADVIL) 200 MG tablet You can take 2 to 3 tablets every 6 hours as needed for pain not relieved by plain Tylenol.  Is your second medication for pain control.  Do not exceed this dosage.  You can buy this over-the-counter at any drugstore.     No current facility-administered medications for this visit.    No Known Allergies    Review of Systems:   General:  normal appetite, normal energy, no weight gain, no weight loss, no fever  Cardiac:  no chest pain with exertion, no chest pain at rest, no SOB with exertion, no resting SOB, no PND, no orthopnea, + palpitations, no arrhythmia, no atrial fibrillation, unilateral right LE edema, no dizzy spells, no syncope  Respiratory:  no shortness of breath, no home oxygen, no productive cough, no dry cough, no bronchitis, no wheezing, no hemoptysis, no asthma, no pain with inspiration or cough, no sleep apnea, no CPAP at night  GI:   no difficulty swallowing, no reflux, no frequent heartburn, no hiatal hernia, no abdominal pain, no constipation, no diarrhea, no hematochezia, no hematemesis, no melena  GU:   no dysuria,  no frequency, no urinary tract infection, no hematuria, no enlarged prostate, no kidney stones, no kidney disease  Vascular:  no pain suggestive of claudication, no pain in feet, no leg cramps, + varicose veins, no DVT, no non-healing foot ulcer  Neuro:   no stroke, no TIA's, no seizures, no headaches, no temporary blindness one eye,  no slurred speech, no peripheral neuropathy, no chronic pain, no instability of gait, no memory/cognitive dysfunction  Musculoskeletal: + arthritis, + joint swelling, no myalgias, no difficulty walking, normal mobility    Skin:   no rash, no itching, no skin infections, no pressure sores or ulcerations  Psych:   no anxiety, no depression, no nervousness, no unusual recent stress  Eyes:   no blurry vision, no floaters, no recent vision changes, + wears glasses or contacts  ENT:   no hearing loss, no loose or painful teeth, no dentures, last saw dentist */2021  Hematologic:  no easy bruising, no abnormal bleeding, no clotting disorder, no frequent epistaxis  Endocrine:  no diabetes, does not check CBG's at home     Physical Exam:   BP (!) 167/88 (BP Location: Right Arm, Patient Position: Sitting)   Pulse 99   Resp 20   Ht 5\' 9"  (1.753 m)   Wt 170 lb (77.1 kg)   SpO2 95% Comment: RA  BMI 25.10 kg/m   General:    well-appearing  HEENT:  Unremarkable   Neck:   no JVD, no bruits, no adenopathy   Chest:   clear to  auscultation, symmetrical breath sounds, no wheezes, no rhonchi   CV:   RRR, grade IV/VI holosystolic murmur   Abdomen:  soft, non-tender, no masses   Extremities:  warm, well-perfused, pulses palpable, no LE edema  Rectal/GU  Deferred  Neuro:   Grossly non-focal and symmetrical throughout  Skin:   Clean and dry, no rashes, no breakdown   Diagnostic Tests:  TRANSTHORACIC ECHOCARDIOGRAM:  Images from transthoracic echocardiograms performed November 12, 2019 and May 19, 2020 are reviewed.  The patient has myxomatous degenerative disease with severe prolapse involving a large middle scallop of the posterior leaflet.  It is possible that this leaflet is flail and the patient definitely has what appears to be severe mitral regurgitation, although quantification of the severity of mitral regurgitation was not performed.  The jet of regurgitation is eccentric and courses around the entire left atrium.  There is some left atrial enlargement.  There is normal left ventricular size and systolic function.  The aortic valve appears normal.  Right ventricular size and function appears  normal.   Impression:  Patient has mitral valve prolapse with stage C1 severe asymptomatic primary mitral regurgitation.  I personally reviewed images from the patient's most recent follow-up transthoracic echocardiogram with findings as noted above.  Based upon review of these images and the patient's physical exam it would be reasonable to consider proceeding with further diagnostic work-up to consider elective surgical intervention.   Plan:  The patient and his partner were counseled at length regarding the diagnosis of severe primary mitral regurgitation.  We reviewed the results of their diagnostic tests including images from the most recent echocardiogram.  We discussed the natural history of mitral regurgitation as well as alternative treatment strategies.  We discussed the impact of  age, current state of health, and any significant comorbid medical problems on clinical decision making.  We went on to discuss the indications, risks and potential benefits of mitral valve repair as well as the timing of surgical intervention.  The rationale for elective surgery has been explained, including a comparison between surgery and continued medical therapy with close follow-up.  Alternative surgical approaches have been discussed including a comparison between conventional sternotomy and minimally-invasive techniques.  Expectations for his postoperative convalescence following elective surgery have been discussed.  As a next step we will plan to refer the patient for transesophageal echocardiogram to further evaluate the severity of mitral regurgitation and in better characterize the likelihood of successful valve repair.  The patient will also undergo gated coronary CT angiogram to assess for the presence of concomitant coronary artery disease, and CT angiogram of the chest, abdomen, and pelvis will be performed at the same time to evaluate the feasibility of peripheral cannulation for surgery.  If  coronary CT angiogram does not reveal findings suggestive of significant coronary artery disease we may be able to avoid the need for diagnostic cardiac catheterization.  The patient will return to our office in approximately 3 weeks to review the results of these tests and discuss treatment options further.  All questions answered.       I spent in excess of 90 minutes during the conduct of this office consultation and >50% of this time involved direct face-to-face encounter with the patient for counseling and/or coordination of their care.    Salvatore Decent. Cornelius Moras, MD 09/28/2020 9:41 AM

## 2020-09-28 NOTE — Patient Instructions (Signed)
Continue all previous medications without any changes at this time  

## 2020-09-29 ENCOUNTER — Other Ambulatory Visit: Payer: Self-pay | Admitting: Thoracic Surgery (Cardiothoracic Vascular Surgery)

## 2020-10-02 ENCOUNTER — Other Ambulatory Visit: Payer: Self-pay | Admitting: Thoracic Surgery (Cardiothoracic Vascular Surgery)

## 2020-10-02 NOTE — Progress Notes (Unsigned)
nur

## 2020-10-04 NOTE — H&P (View-Only) (Signed)
Cardiology Office Note:   Date:  10/05/2020  NAME:  Perry Lewis    MRN: [id number] DOB:  06-08-1966   PCP:  Eartha Inch, MD  Cardiologist:  No primary care provider on file.   Referring MD: Eartha Inch, MD   Chief Complaint  Patient presents with  . Mitral Regurgitation    History of Present Illness:   Perry Lewis is a 55 y.o. male with a hx of RA, mitral valve prolapse/regurgitation who is being seen today for the evaluation of mitral valve regurgitation at the request of Eartha Inch, MD. Perry Lewis has know PMVL prolapse and was referred for surgery. Needs TEE.  He is recently followed through [facility] cardiology.  He apparently had a transthoracic echocardiogram which showed severe mitral regurgitation and prolapse of the posterior mitral valve leaflet.  He has a considerable murmur on examination which is consistent with severe mitral regurgitation of the posterior leaflet.  The murmur does radiate anteriorly.  He has not had a transesophageal echocardiogram.  He reports his medical history significant for rheumatoid arthritis.  This is well controlled on methotrexate.  There is no history of high blood pressure diabetes or hyperlipidemia.  Most recent lipid profile as below.  He does have a coronary CTA planned.  He was seen by surgery for evaluation of his mitral vegetation.  He was then referred to me for transesophageal echo.  He reports no symptoms.  He denies any chest pain or shortness of breath.  He is not exercising currently.  He is a never smoker.  He drinks alcohol in moderation.  No drug use is reported.  He reports family history is noncontributory.  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  [place]  He is not exercising routinely but denies any symptoms with his current level of activity.  Problem List 1. Rheumatoid arthritis 2. Mitral valve prolapse/regurgitation 3. HLD -T chol 216, LDL 141, TG 163, HDL 42  Past Medical  History: Past Medical History:  Diagnosis Date  . Mitral regurgitation   . Mitral valve prolapse   . Rheumatoid arthritis [facility])     Past Surgical History: Past Surgical History:  Procedure Laterality Date  . HERNIA REPAIR    . LAPAROSCOPIC APPENDECTOMY N/A 11/27/2018   Procedure: APPENDECTOMY LAPAROSCOPIC;  Surgeon: Gaynelle Adu, MD;  Location: [facility];  Service: General;  Laterality: N/A;    Current Medications: Current Meds  Medication Sig  . folic acid (FOLVITE) 1 MG tablet Take 2 mg by mouth daily.  . methotrexate (RHEUMATREX) 2.5 MG tablet Take 2.5 mg by mouth once a week. Caution:Chemotherapy. Protect from light. 6 weekly     Allergies:    Patient has no known allergies.   Social History: Social History   Socioeconomic History  . Marital status: Single    Spouse name: Not on file  . Number of children: Not on file  . Years of education: Not on file  . Highest education level: Not on file  Occupational History  . Occupation: Adult nurse  Tobacco Use  . Smoking status: Never Smoker  . Smokeless tobacco: Never Used  Substance and Sexual Activity  . Alcohol use: Yes  . Drug use: Never  . Sexual activity: Not on file  Other Topics Concern  . Not on file  Social History Narrative  . Not on file   Social Determinants of Health   Financial Resource Strain: Not on file  Food Insecurity:  Not on file  Transportation Needs: Not on file  Physical Activity: Not on file  Stress: Not on file  Social Connections: Not on file     Family History: The patient's family history is not on file.  ROS:   All other ROS reviewed and negative. Pertinent positives noted in the HPI.     EKGs/Labs/Other Studies Reviewed:   The following studies were personally reviewed by me today:  EKG:  EKG is ordered today.  The ekg ordered today demonstrates normal sinus rhythm heart rate 80, no acute ischemic changes, no evidence of infarction, and was personally reviewed  by me.   TTE 05/19/2020 MitralValve: The posterior leaflet is severely prolapsed.  . LeftAtrium: Left atrium is moderately dilated.  . LeftVentricle: Systolic function is normal. EF: 65%.  . MitralValve: The leaflets are not thickened and exhibit normal  excursion.  . MitralValve: There is at least moderate to severe but possibly severe  regurgitation with an anteriorly directed jet. PISA is not available to  calculate effective regurgitant orifice area.  . LeftVentricle: Doppler parameters indicate normal diastolic function.  . TricuspidValve: The right ventricular systolic pressure is at upper  limit of normal 32 mmHg.  . LeftVentricle: Wall motion is normal.  . LeftVentricle: Normal left ventricular size. LV end systolic size 3  cm, end-diastolic size 5.18 cm.    Recent Labs: No results found for requested labs within last 8760 hours.   Recent Lipid Panel No results found for: CHOL, TRIG, HDL, CHOLHDL, VLDL, LDLCALC, LDLDIRECT  Physical Exam:   VS:  BP 134/74   Pulse 80   Ht 5' 6" (1.676 m)   Wt 170 lb 12.8 oz (77.5 kg)   SpO2 97%   BMI 27.57 kg/m    Wt Readings from Last 3 Encounters:  10/05/20 170 lb 12.8 oz (77.5 kg)  09/28/20 170 lb (77.1 kg)  11/27/18 170 lb (77.1 kg)    General: Well nourished, well developed, in no acute distress Head: Atraumatic, normal size  Eyes: PEERLA, EOMI  Neck: Supple, no JVD Endocrine: No thryomegaly Cardiac: Normal S1, S2; RRR; 3 out of 6 harsh holosystolic murmur, radiates anteriorly Lungs: Clear to auscultation bilaterally, no wheezing, rhonchi or rales  Abd: Soft, nontender, no hepatomegaly  Ext: No edema, pulses 2+ Musculoskeletal: No deformities, BUE and BLE strength normal and equal Skin: Warm and dry, no rashes   Neuro: Alert and oriented to person, place, time, and situation, CNII-XII grossly intact, no focal deficits  Psych: Normal mood and affect   ASSESSMENT:   Perry Lewis is a 54 y.o. male who  presents for the following: 1. Mitral valve prolapse   2. Nonrheumatic mitral valve regurgitation     PLAN:   1. Mitral valve prolapse 2. Nonrheumatic mitral valve regurgitation -Moderate to severe mitral regurgitation with prolapsing of the posterior mitral valve leaflet seen on echocardiogram through the [facility] system in October 2021.  No symptoms from this.  Murmur is quite consistent with severe mitral regurgitation from the posterior mitral valve leaflet with an anteriorly directed murmur.  He is already been evaluated by surgery.  He needs a TEE to determine the severity of his MR as well as repairability.  We discussed the transesophageal echocardiogram in office.  No difficulty swallowing.  No bleeding in the stomach.  He is willing to proceed.  I think he would be an excellent candidate for an early repair.  He reports he would like to follow with us moving forward.    We will see him in 4 months.  Hopefully he can get his surgery done before that time.  Shared Decision Making/Informed Consent The risks [esophageal damage, perforation (1:10,000 risk), bleeding, pharyngeal hematoma as well as other potential complications associated with conscious sedation including aspiration, arrhythmia, respiratory failure and death], benefits (treatment guidance and diagnostic support) and alternatives of a transesophageal echocardiogram were discussed in detail with Perry Lewis and he is willing to proceed.   Disposition: Return in about 4 months (around 02/04/2021).  Medication Adjustments/Labs and Tests Ordered: Current medicines are reviewed at length with the patient today.  Concerns regarding medicines are outlined above.  Orders Placed This Encounter  Procedures  . Basic metabolic panel  . CBC  . EKG 12-Lead   No orders of the defined types were placed in this encounter.   Patient Instructions  Medication Instructions:  The current medical regimen is effective;  continue present plan and  medications.  *If you need a refill on your cardiac medications before your next appointment, please call your pharmacy*   Lab Work: CBC, BMET (week of March 21st) COVID TESTING: March 25th at 12:05 PM ([address], [place] [address])  If you have labs (blood work) drawn today and your tests are completely normal, you will receive your results only by: Marland Kitchen MyChart Message (if you have MyChart) OR . A paper copy in the mail If you have any lab test that is abnormal or we need to change your treatment, we will call you to review the results.   Testing/Procedures: Your physician has requested that you have a TEE. During a TEE, sound waves are used to create images of your heart. It provides your doctor with information about the size and shape of your heart and how well your heart's chambers and valves are working. In this test, a transducer is attached to the end of a flexible tube that's guided down your throat and into your esophagus (the tube leading from you mouth to your stomach) to get a more detailed image of your heart. You are not awake for the procedure. Please see the instruction sheet given to you today. For further information please visit [email or web address]   Follow-Up: At [facility], you and your health needs are our priority.  As part of our continuing mission to provide you with exceptional heart care, we have created designated Provider Care Teams.  These Care Teams include your primary Cardiologist (physician) and Advanced Practice Providers (APPs -  Physician Assistants and Nurse Practitioners) who all work together to provide you with the care you need, when you need it.  We recommend signing up for the patient portal called "MyChart".  Sign up information is provided on this After Visit Summary.  MyChart is used to connect with patients for Virtual Visits (Telemedicine).  Patients are able to view lab/test results, encounter notes, upcoming appointments, etc.   Non-urgent messages can be sent to your provider as well.   To learn more about what you can do with MyChart, go to [email or web address].    Your next appointment:   4 month(s)  The format for your next appointment:   In Person  Provider:   Lennie Odor, MD   Other Instructions  You are scheduled for a TEE on March 28th with Dr. Flora Lipps.  Please arrive at the [facility] (Main Entrance A) at [facility]: [address] [place], [place] [address] at 7:30 AM . (1 hour prior to procedure unless lab work  is needed; if lab work is needed arrive 1.5 hours ahead)  DIET: Nothing to eat or drink after midnight except a sip of water with medications (see medication instructions below)  Medication Instructions: No changes   Labs: CBC, BMET within 1 week of procedure.   You must have a responsible person to drive you home and stay in the waiting area during your procedure. Failure to do so could result in cancellation.  Bring your insurance cards.  *Special Note: Every effort is made to have your procedure done on time. Occasionally there are emergencies that occur at the hospital that may cause delays. Please be patient if a delay does occur.        Signed, Lenna Gilford. Flora Lipps, MD [facility]  [address] [place], [place] [address] [phone]  10/05/2020 9:14 AM

## 2020-10-04 NOTE — Progress Notes (Unsigned)
Cardiology Office Note:   Date:  10/05/2020  NAME:  Perry Lewis    MRN: [id number] DOB:  06-08-1966   PCP:  Eartha Inch, MD  Cardiologist:  No primary care provider on file.   Referring MD: Eartha Inch, MD   Chief Complaint  Patient presents with  . Mitral Regurgitation    History of Present Illness:   Perry Lewis is a 55 y.o. male with a hx of RA, mitral valve prolapse/regurgitation who is being seen today for the evaluation of mitral valve regurgitation at the request of Eartha Inch, MD. Perry Lewis has know PMVL prolapse and was referred for surgery. Needs TEE.  He is recently followed through [facility] cardiology.  He apparently had a transthoracic echocardiogram which showed severe mitral regurgitation and prolapse of the posterior mitral valve leaflet.  He has a considerable murmur on examination which is consistent with severe mitral regurgitation of the posterior leaflet.  The murmur does radiate anteriorly.  He has not had a transesophageal echocardiogram.  He reports his medical history significant for rheumatoid arthritis.  This is well controlled on methotrexate.  There is no history of high blood pressure diabetes or hyperlipidemia.  Most recent lipid profile as below.  He does have a coronary CTA planned.  He was seen by surgery for evaluation of his mitral vegetation.  He was then referred to me for transesophageal echo.  He reports no symptoms.  He denies any chest pain or shortness of breath.  He is not exercising currently.  He is a never smoker.  He drinks alcohol in moderation.  No drug use is reported.  He reports family history is noncontributory.  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  [place]  He is not exercising routinely but denies any symptoms with his current level of activity.  Problem List 1. Rheumatoid arthritis 2. Mitral valve prolapse/regurgitation 3. HLD -T chol 216, LDL 141, TG 163, HDL 42  Past Medical  History: Past Medical History:  Diagnosis Date  . Mitral regurgitation   . Mitral valve prolapse   . Rheumatoid arthritis [facility])     Past Surgical History: Past Surgical History:  Procedure Laterality Date  . HERNIA REPAIR    . LAPAROSCOPIC APPENDECTOMY N/A 11/27/2018   Procedure: APPENDECTOMY LAPAROSCOPIC;  Surgeon: Gaynelle Adu, MD;  Location: [facility];  Service: General;  Laterality: N/A;    Current Medications: Current Meds  Medication Sig  . folic acid (FOLVITE) 1 MG tablet Take 2 mg by mouth daily.  . methotrexate (RHEUMATREX) 2.5 MG tablet Take 2.5 mg by mouth once a week. Caution:Chemotherapy. Protect from light. 6 weekly     Allergies:    Patient has no known allergies.   Social History: Social History   Socioeconomic History  . Marital status: Single    Spouse name: Not on file  . Number of children: Not on file  . Years of education: Not on file  . Highest education level: Not on file  Occupational History  . Occupation: Adult nurse  Tobacco Use  . Smoking status: Never Smoker  . Smokeless tobacco: Never Used  Substance and Sexual Activity  . Alcohol use: Yes  . Drug use: Never  . Sexual activity: Not on file  Other Topics Concern  . Not on file  Social History Narrative  . Not on file   Social Determinants of Health   Financial Resource Strain: Not on file  Food Insecurity:  Not on file  Transportation Needs: Not on file  Physical Activity: Not on file  Stress: Not on file  Social Connections: Not on file     Family History: The patient's family history is not on file.  ROS:   All other ROS reviewed and negative. Pertinent positives noted in the HPI.     EKGs/Labs/Other Studies Reviewed:   The following studies were personally reviewed by me today:  EKG:  EKG is ordered today.  The ekg ordered today demonstrates normal sinus rhythm heart rate 80, no acute ischemic changes, no evidence of infarction, and was personally reviewed  by me.   TTE 05/19/2020 MitralValve: The posterior leaflet is severely prolapsed.  . LeftAtrium: Left atrium is moderately dilated.  Marland Kitchen LeftVentricle: Systolic function is normal. EF: 65%.  . MitralValve: The leaflets are not thickened and exhibit normal  excursion.  . MitralValve: There is at least moderate to severe but possibly severe  regurgitation with an anteriorly directed jet. PISA is not available to  calculate effective regurgitant orifice area.  . LeftVentricle: Doppler parameters indicate normal diastolic function.  . TricuspidValve: The right ventricular systolic pressure is at upper  limit of normal 32 mmHg.  Marland Kitchen LeftVentricle: Wall motion is normal.  . LeftVentricle: Normal left ventricular size. LV end systolic size 3  cm, end-diastolic size 5.18 cm.    Recent Labs: No results found for requested labs within last 8760 hours.   Recent Lipid Panel No results found for: CHOL, TRIG, HDL, CHOLHDL, VLDL, LDLCALC, LDLDIRECT  Physical Exam:   VS:  BP 134/74   Pulse 80   Ht  (1.676 m)   Wt 170 lb 12.8 oz (77.5 kg)   SpO2 97%   BMI 27.57 kg/m    Wt Readings from Last 3 Encounters:  10/05/20 170 lb 12.8 oz (77.5 kg)  09/28/20 170 lb (77.1 kg)  11/27/18 170 lb (77.1 kg)    General: Well nourished, well developed, in no acute distress Head: Atraumatic, normal size  Eyes: PEERLA, EOMI  Neck: Supple, no JVD Endocrine: No thryomegaly Cardiac: Normal S1, S2; RRR; 3 out of 6 harsh holosystolic murmur, radiates anteriorly Lungs: Clear to auscultation bilaterally, no wheezing, rhonchi or rales  Abd: Soft, nontender, no hepatomegaly  Ext: No edema, pulses 2+ Musculoskeletal: No deformities, BUE and BLE strength normal and equal Skin: Warm and dry, no rashes   Neuro: Alert and oriented to person, place, time, and situation, CNII-XII grossly intact, no focal deficits  Psych: Normal mood and affect   ASSESSMENT:   Perry Lewis is a 55 y.o. male who  presents for the following: 1. Mitral valve prolapse   2. Nonrheumatic mitral valve regurgitation     PLAN:   1. Mitral valve prolapse 2. Nonrheumatic mitral valve regurgitation -Moderate to severe mitral regurgitation with prolapsing of the posterior mitral valve leaflet seen on echocardiogram through the [facility] system in October 2021.  No symptoms from this.  Murmur is quite consistent with severe mitral regurgitation from the posterior mitral valve leaflet with an anteriorly directed murmur.  He is already been evaluated by surgery.  He needs a TEE to determine the severity of his MR as well as repairability.  We discussed the transesophageal echocardiogram in office.  No difficulty swallowing.  No bleeding in the stomach.  He is willing to proceed.  I think he would be an excellent candidate for an early repair.  He reports he would like to follow with [place] moving forward.  We will see him in 4 months.  Hopefully he can get his surgery done before that time.  Shared Decision Making/Informed Consent The risks [esophageal damage, perforation (1:10,000 risk), bleeding, pharyngeal hematoma as well as other potential complications associated with conscious sedation including aspiration, arrhythmia, respiratory failure and death], benefits (treatment guidance and diagnostic support) and alternatives of a transesophageal echocardiogram were discussed in detail with Perry Lewis and he is willing to proceed.   Disposition: Return in about 4 months (around 02/04/2021).  Medication Adjustments/Labs and Tests Ordered: Current medicines are reviewed at length with the patient today.  Concerns regarding medicines are outlined above.  Orders Placed This Encounter  Procedures  . Basic metabolic panel  . CBC  . EKG 12-Lead   No orders of the defined types were placed in this encounter.   Patient Instructions  Medication Instructions:  The current medical regimen is effective;  continue present plan and  medications.  *If you need a refill on your cardiac medications before your next appointment, please call your pharmacy*   Lab Work: CBC, BMET (week of March 21st) COVID TESTING: March 25th at 12:05 PM ([address], [place] [address])  If you have labs (blood work) drawn today and your tests are completely normal, you will receive your results only by: Marland Kitchen MyChart Message (if you have MyChart) OR . A paper copy in the mail If you have any lab test that is abnormal or we need to change your treatment, we will call you to review the results.   Testing/Procedures: Your physician has requested that you have a TEE. During a TEE, sound waves are used to create images of your heart. It provides your doctor with information about the size and shape of your heart and how well your heart's chambers and valves are working. In this test, a transducer is attached to the end of a flexible tube that's guided down your throat and into your esophagus (the tube leading from you mouth to your stomach) to get a more detailed image of your heart. You are not awake for the procedure. Please see the instruction sheet given to you today. For further information please visit [email or web address]   Follow-Up: At [facility], you and your health needs are our priority.  As part of our continuing mission to provide you with exceptional heart care, we have created designated Provider Care Teams.  These Care Teams include your primary Cardiologist (physician) and Advanced Practice Providers (APPs -  Physician Assistants and Nurse Practitioners) who all work together to provide you with the care you need, when you need it.  We recommend signing up for the patient portal called "MyChart".  Sign up information is provided on this After Visit Summary.  MyChart is used to connect with patients for Virtual Visits (Telemedicine).  Patients are able to view lab/test results, encounter notes, upcoming appointments, etc.   Non-urgent messages can be sent to your provider as well.   To learn more about what you can do with MyChart, go to [email or web address].    Your next appointment:   4 month(s)  The format for your next appointment:   In Person  Provider:   Lennie Odor, MD   Other Instructions  You are scheduled for a TEE on March 28th with Dr. Flora Lipps.  Please arrive at the [facility] (Main Entrance A) at [facility]: [address] [place], [place] [address] at 7:30 AM . (1 hour prior to procedure unless lab work  is needed; if lab work is needed arrive 1.5 hours ahead)  DIET: Nothing to eat or drink after midnight except a sip of water with medications (see medication instructions below)  Medication Instructions: No changes   Labs: CBC, BMET within 1 week of procedure.   You must have a responsible person to drive you home and stay in the waiting area during your procedure. Failure to do so could result in cancellation.  Bring your insurance cards.  *Special Note: Every effort is made to have your procedure done on time. Occasionally there are emergencies that occur at the hospital that may cause delays. Please be patient if a delay does occur.        Signed, Lenna Gilford. Flora Lipps, MD [facility]  [address] [place], [place] [address] [phone]  10/05/2020 9:14 AM

## 2020-10-05 ENCOUNTER — Other Ambulatory Visit: Payer: Self-pay

## 2020-10-05 ENCOUNTER — Encounter: Payer: Self-pay | Admitting: Thoracic Surgery (Cardiothoracic Vascular Surgery)

## 2020-10-05 ENCOUNTER — Encounter (HOSPITAL_COMMUNITY): Payer: Self-pay

## 2020-10-05 ENCOUNTER — Encounter: Payer: Self-pay | Admitting: Cardiovascular Disease

## 2020-10-05 ENCOUNTER — Telehealth (HOSPITAL_COMMUNITY): Payer: Self-pay | Admitting: *Deleted

## 2020-10-05 ENCOUNTER — Other Ambulatory Visit (HOSPITAL_COMMUNITY): Payer: Self-pay | Admitting: *Deleted

## 2020-10-05 ENCOUNTER — Ambulatory Visit (INDEPENDENT_AMBULATORY_CARE_PROVIDER_SITE_OTHER): Payer: BC Managed Care – PPO | Admitting: Cardiovascular Disease

## 2020-10-05 VITALS — BP 134/74 | HR 80 | Ht 66.0 in | Wt 170.8 lb

## 2020-10-05 DIAGNOSIS — I340 Nonrheumatic mitral (valve) insufficiency: Secondary | ICD-10-CM | POA: Diagnosis not present

## 2020-10-05 DIAGNOSIS — I341 Nonrheumatic mitral (valve) prolapse: Secondary | ICD-10-CM | POA: Diagnosis not present

## 2020-10-05 MED ORDER — METOPROLOL TARTRATE 100 MG PO TABS: ORAL_TABLET | ORAL | 1 refills | Status: DC

## 2020-10-05 NOTE — Telephone Encounter (Signed)
Reaching out to patient to offer assistance regarding upcoming cardiac imaging study; pt verbalizes understanding of appt date/time, parking situation and where to check in, pre-test NPO status and medications ordered, and verified current allergies; name and call back number provided for further questions should they arise  Larey Brick RN Navigator Cardiac Imaging [facility] [phone] office [phone] cell  Pt to pick up metoprolol today from pharmacy.

## 2020-10-05 NOTE — Patient Instructions (Signed)
Medication Instructions:  The current medical regimen is effective;  continue present plan and medications.  *If you need a refill on your cardiac medications before your next appointment, please call your pharmacy*   Lab Work: CBC, BMET (week of March 21st) COVID TESTING: March 25th at 12:05 PM ([address], [place] [address])  If you have labs (blood work) drawn today and your tests are completely normal, you will receive your results only by: Marland Kitchen MyChart Message (if you have MyChart) OR . A paper copy in the mail If you have any lab test that is abnormal or we need to change your treatment, we will call you to review the results.   Testing/Procedures: Your physician has requested that you have a TEE. During a TEE, sound waves are used to create images of your heart. It provides your doctor with information about the size and shape of your heart and how well your heart's chambers and valves are working. In this test, a transducer is attached to the end of a flexible tube that's guided down your throat and into your esophagus (the tube leading from you mouth to your stomach) to get a more detailed image of your heart. You are not awake for the procedure. Please see the instruction sheet given to you today. For further information please visit [email or web address]   Follow-Up: At [facility], you and your health needs are our priority.  As part of our continuing mission to provide you with exceptional heart care, we have created designated Provider Care Teams.  These Care Teams include your primary Cardiologist (physician) and Advanced Practice Providers (APPs -  Physician Assistants and Nurse Practitioners) who all work together to provide you with the care you need, when you need it.  We recommend signing up for the patient portal called "MyChart".  Sign up information is provided on this After Visit Summary.  MyChart is used to connect with patients for Virtual Visits  (Telemedicine).  Patients are able to view lab/test results, encounter notes, upcoming appointments, etc.  Non-urgent messages can be sent to your provider as well.   To learn more about what you can do with MyChart, go to [email or web address].    Your next appointment:   4 month(s)  The format for your next appointment:   In Person  Provider:   Lennie Odor, MD   Other Instructions  You are scheduled for a TEE on March 28th with Dr. Flora Lipps.  Please arrive at the [facility] (Main Entrance A) at [facility]: [address] [place], [place] [address] at 7:30 AM . (1 hour prior to procedure unless lab work is needed; if lab work is needed arrive 1.5 hours ahead)  DIET: Nothing to eat or drink after midnight except a sip of water with medications (see medication instructions below)  Medication Instructions: No changes   Labs: CBC, BMET within 1 week of procedure.   You must have a responsible person to drive you home and stay in the waiting area during your procedure. Failure to do so could result in cancellation.  Bring your insurance cards.  *Special Note: Every effort is made to have your procedure done on time. Occasionally there are emergencies that occur at the hospital that may cause delays. Please be patient if a delay does occur.

## 2020-10-06 ENCOUNTER — Ambulatory Visit (HOSPITAL_COMMUNITY)
Admission: RE | Admit: 2020-10-06 | Discharge: 2020-10-06 | Disposition: A | Discharge status: Home / Self Care | Payer: BC Managed Care – PPO | Source: Ambulatory Visit | Attending: Thoracic Surgery (Cardiothoracic Vascular Surgery) | Admitting: Thoracic Surgery (Cardiothoracic Vascular Surgery)

## 2020-10-06 ENCOUNTER — Encounter (HOSPITAL_COMMUNITY): Payer: Self-pay

## 2020-10-06 DIAGNOSIS — Z01818 Encounter for other preprocedural examination: Secondary | ICD-10-CM | POA: Diagnosis not present

## 2020-10-06 DIAGNOSIS — I341 Nonrheumatic mitral (valve) prolapse: Secondary | ICD-10-CM | POA: Diagnosis not present

## 2020-10-06 DIAGNOSIS — K5730 Diverticulosis of large intestine without perforation or abscess without bleeding: Secondary | ICD-10-CM | POA: Diagnosis not present

## 2020-10-06 MED ORDER — IOHEXOL 350 MG/ML SOLN
100.0000 mL | Freq: Once | INTRAVENOUS | Status: AC | PRN
  Administered 2020-10-06: 100 mL via INTRAVENOUS

## 2020-10-06 MED ORDER — NITROGLYCERIN 0.4 MG SL SUBL
SUBLINGUAL_TABLET | SUBLINGUAL | Status: AC
  Filled 2020-10-06: qty 2

## 2020-10-06 MED ORDER — NITROGLYCERIN 0.4 MG SL SUBL
0.8000 mg | SUBLINGUAL_TABLET | Freq: Once | SUBLINGUAL | Status: AC | PRN
  Administered 2020-10-06: 0.8 mg via SUBLINGUAL

## 2020-10-11 ENCOUNTER — Encounter: Payer: Self-pay | Admitting: Thoracic Surgery (Cardiothoracic Vascular Surgery)

## 2020-10-19 ENCOUNTER — Ambulatory Visit: Payer: BC Managed Care – PPO | Admitting: Thoracic Surgery (Cardiothoracic Vascular Surgery)

## 2020-10-20 ENCOUNTER — Encounter: Payer: Self-pay | Admitting: Thoracic Surgery (Cardiothoracic Vascular Surgery)

## 2020-10-21 DIAGNOSIS — I340 Nonrheumatic mitral (valve) insufficiency: Secondary | ICD-10-CM | POA: Diagnosis not present

## 2020-10-22 LAB — BASIC METABOLIC PANEL
BUN/Creatinine Ratio: 17 (ref 9–20)
BUN: 13 mg/dL (ref 6–24)
CO2: 22 mmol/L (ref 20–29)
Calcium: 9.9 mg/dL (ref 8.7–10.2)
Chloride: 99 mmol/L (ref 96–106)
Creatinine, Ser: 0.76 mg/dL (ref 0.76–1.27)
Glucose: 120 mg/dL — ABNORMAL HIGH (ref 65–99)
Potassium: 4.9 mmol/L (ref 3.5–5.2)
Sodium: 138 mmol/L (ref 134–144)
eGFR: 106 mL/min/{1.73_m2} (ref >59)

## 2020-10-22 LAB — CBC
Hematocrit: 42.5 % (ref 37.5–51.0)
Hemoglobin: 14 g/dL (ref 13.0–17.7)
MCH: 31.4 pg (ref 26.6–33.0)
MCHC: 32.9 g/dL (ref 31.5–35.7)
MCV: 95 fL (ref 79–97)
Platelets: 285 10*3/uL (ref 150–450)
RBC: 4.46 x10E6/uL (ref 4.14–5.80)
RDW: 12.5 % (ref 11.6–15.4)
WBC: 5.8 10*3/uL (ref 3.4–10.8)

## 2020-10-23 ENCOUNTER — Other Ambulatory Visit (HOSPITAL_COMMUNITY)
Admission: RE | Admit: 2020-10-23 | Discharge: 2020-10-23 | Disposition: A | Discharge status: Home / Self Care | Payer: BC Managed Care – PPO | Source: Ambulatory Visit | Attending: Cardiovascular Disease | Admitting: Cardiovascular Disease

## 2020-10-23 DIAGNOSIS — I341 Nonrheumatic mitral (valve) prolapse: Secondary | ICD-10-CM | POA: Diagnosis not present

## 2020-10-23 DIAGNOSIS — M069 Rheumatoid arthritis, unspecified: Secondary | ICD-10-CM | POA: Diagnosis not present

## 2020-10-23 DIAGNOSIS — I340 Nonrheumatic mitral (valve) insufficiency: Secondary | ICD-10-CM | POA: Diagnosis not present

## 2020-10-23 DIAGNOSIS — Z20822 Contact with and (suspected) exposure to covid-19: Secondary | ICD-10-CM | POA: Insufficient documentation

## 2020-10-23 DIAGNOSIS — Z79899 Other long term (current) drug therapy: Secondary | ICD-10-CM | POA: Diagnosis not present

## 2020-10-23 DIAGNOSIS — Z01812 Encounter for preprocedural laboratory examination: Secondary | ICD-10-CM | POA: Insufficient documentation

## 2020-10-23 LAB — SARS CORONAVIRUS 2 (TAT 6-24 HRS): SARS Coronavirus 2: NEGATIVE

## 2020-10-26 ENCOUNTER — Encounter (HOSPITAL_COMMUNITY): Payer: Self-pay | Admitting: Cardiovascular Disease

## 2020-10-26 ENCOUNTER — Ambulatory Visit (HOSPITAL_COMMUNITY): Payer: BC Managed Care – PPO | Admitting: Certified Registered Nurse Anesthetist

## 2020-10-26 ENCOUNTER — Ambulatory Visit (HOSPITAL_COMMUNITY)
Admission: RE | Admit: 2020-10-26 | Discharge: 2020-10-26 | Disposition: A | Discharge status: Home / Self Care | Payer: BC Managed Care – PPO | Attending: Cardiovascular Disease | Admitting: Cardiovascular Disease

## 2020-10-26 ENCOUNTER — Encounter (HOSPITAL_COMMUNITY)
Admission: RE | Disposition: A | Discharge status: Home / Self Care | Payer: Self-pay | Source: Home / Self Care | Attending: Cardiovascular Disease

## 2020-10-26 ENCOUNTER — Other Ambulatory Visit: Payer: Self-pay

## 2020-10-26 ENCOUNTER — Ambulatory Visit (HOSPITAL_BASED_OUTPATIENT_CLINIC_OR_DEPARTMENT_OTHER)
Admission: RE | Admit: 2020-10-26 | Discharge: 2020-10-26 | Disposition: A | Discharge status: Home / Self Care | Payer: BC Managed Care – PPO | Source: Home / Self Care | Attending: Cardiovascular Disease | Admitting: Cardiovascular Disease

## 2020-10-26 DIAGNOSIS — I340 Nonrheumatic mitral (valve) insufficiency: Secondary | ICD-10-CM | POA: Insufficient documentation

## 2020-10-26 DIAGNOSIS — I341 Nonrheumatic mitral (valve) prolapse: Secondary | ICD-10-CM | POA: Diagnosis not present

## 2020-10-26 DIAGNOSIS — Z20822 Contact with and (suspected) exposure to covid-19: Secondary | ICD-10-CM | POA: Diagnosis not present

## 2020-10-26 DIAGNOSIS — Z79899 Other long term (current) drug therapy: Secondary | ICD-10-CM | POA: Diagnosis not present

## 2020-10-26 DIAGNOSIS — M069 Rheumatoid arthritis, unspecified: Secondary | ICD-10-CM | POA: Insufficient documentation

## 2020-10-26 DIAGNOSIS — I081 Rheumatic disorders of both mitral and tricuspid valves: Secondary | ICD-10-CM | POA: Diagnosis not present

## 2020-10-26 HISTORY — PX: TEE WITHOUT CARDIOVERSION: SHX5443

## 2020-10-26 HISTORY — PX: BUBBLE STUDY: SHX6837

## 2020-10-26 LAB — ECHO TEE
MV M vel: 5.06 m/s
MV Peak grad: 102.4 mmHg
Radius: 2.6 cm

## 2020-10-26 SURGERY — ECHOCARDIOGRAM, TRANSESOPHAGEAL
Anesthesia: Monitor Anesthesia Care

## 2020-10-26 MED ORDER — SODIUM CHLORIDE 0.9 % IV SOLN
INTRAVENOUS | Status: AC | PRN
Start: 2020-10-26 — End: 2020-10-26
  Administered 2020-10-26: 500 mL via INTRAVENOUS

## 2020-10-26 MED ORDER — PROPOFOL 500 MG/50ML IV EMUL
INTRAVENOUS | Status: DC | PRN
  Administered 2020-10-26: 175 ug/kg/min via INTRAVENOUS
  Administered 2020-10-26: 125 ug/kg/min via INTRAVENOUS

## 2020-10-26 MED ORDER — PROPOFOL 10 MG/ML IV BOLUS
INTRAVENOUS | Status: DC | PRN
  Administered 2020-10-26: 30 mg via INTRAVENOUS

## 2020-10-26 MED ORDER — SODIUM CHLORIDE 0.9 % IV SOLN: INTRAVENOUS | Status: DC

## 2020-10-26 MED ORDER — BUTAMBEN-TETRACAINE-BENZOCAINE 2-2-14 % EX AERO
INHALATION_SPRAY | CUTANEOUS | Status: DC | PRN
  Administered 2020-10-26: 2 via TOPICAL

## 2020-10-26 MED ORDER — GLYCOPYRROLATE 0.2 MG/ML IJ SOLN
INTRAMUSCULAR | Status: DC | PRN
  Administered 2020-10-26 (×2): .1 mg via INTRAVENOUS

## 2020-10-26 MED ORDER — BUTAMBEN-TETRACAINE-BENZOCAINE 2-2-14 % EX AERO: INHALATION_SPRAY | CUTANEOUS | Status: DC | PRN

## 2020-10-26 MED ORDER — SODIUM CHLORIDE 0.9 % IV SOLN: INTRAVENOUS | Status: DC | PRN

## 2020-10-26 NOTE — Anesthesia Preprocedure Evaluation (Addendum)
Anesthesia Evaluation  Patient identified by MRN, date of birth, ID band Patient awake    Reviewed: Allergy & Precautions, H&P , NPO status , Patient's Chart, lab work & pertinent test results  Airway Mallampati: II  TM Distance: >3 FB Neck ROM: Full    Dental no notable dental hx. (+) Teeth Intact, Dental Advisory Given   Pulmonary neg pulmonary ROS,    Pulmonary exam normal breath sounds clear to auscultation       Cardiovascular + Valvular Problems/Murmurs MR  Rhythm:Regular Rate:Normal     Neuro/Psych negative neurological ROS  negative psych ROS   GI/Hepatic negative GI ROS, Neg liver ROS,   Endo/Other  negative endocrine ROS  Renal/GU negative Renal ROS  negative genitourinary   Musculoskeletal  (+) Arthritis , Osteoarthritis,    Abdominal   Peds  Hematology negative hematology ROS (+)   Anesthesia Other Findings   Reproductive/Obstetrics negative OB ROS                            Anesthesia Physical Anesthesia Plan  ASA: II  Anesthesia Plan: MAC   Post-op Pain Management:    Induction: Intravenous  PONV Risk Score and Plan: 1 and Propofol infusion  Airway Management Planned: Nasal Cannula  Additional Equipment:   Intra-op Plan:   Post-operative Plan:   Informed Consent: I have reviewed the patients History and Physical, chart, labs and discussed the procedure including the risks, benefits and alternatives for the proposed anesthesia with the patient or authorized representative who has indicated his/her understanding and acceptance.     Dental advisory given  Plan Discussed with: CRNA  Anesthesia Plan Comments:         Anesthesia Quick Evaluation

## 2020-10-26 NOTE — Interval H&P Note (Signed)
History and Physical Interval Note:  10/26/2020 8:23 AM  Perry Lewis  has presented today for surgery, with the diagnosis of MYTRIAL REGURGITATION.  The various methods of treatment have been discussed with the patient and family. After consideration of risks, benefits and other options for treatment, the patient has consented to  Procedure(s): TRANSESOPHAGEAL ECHOCARDIOGRAM (TEE) (N/A) as a surgical intervention.  The patient's history has been reviewed, patient examined, no change in status, stable for surgery.  I have reviewed the patient's chart and labs.  Questions were answered to the patient's satisfaction.    NPO since midnight. TEE for severe MR.   Gerri Spore T. Flora Lipps, MD, [facility]  [facility]  [address], [address] [place], [place] [address] [phone]  8:23 AM

## 2020-10-26 NOTE — Progress Notes (Signed)
  Echocardiogram 2D Echocardiogram has been performed.  Tye Savoy 10/26/2020, 10:02 AM

## 2020-10-26 NOTE — Transfer of Care (Signed)
Immediate Anesthesia Transfer of Care Note  Patient: Perry Lewis  Procedure(s) Performed: TRANSESOPHAGEAL ECHOCARDIOGRAM (TEE) (N/A ) BUBBLE STUDY  Patient Location: Endoscopy Unit  Anesthesia Type:MAC  Level of Consciousness: drowsy and patient cooperative  Airway & Oxygen Therapy: Patient Spontanous Breathing and Patient connected to nasal cannula oxygen  Post-op Assessment: Report given to RN and Post -op Vital signs reviewed and stable  Post vital signs: Reviewed and stable  Last Vitals:  Vitals Value Taken Time  BP 116/66   Temp    Pulse 84   Resp    SpO2 94     Last Pain:  Vitals:   10/26/20 0745  TempSrc: Oral  PainSc: 0-No pain         Complications: No complications documented.

## 2020-10-26 NOTE — Anesthesia Postprocedure Evaluation (Signed)
Anesthesia Post Note  Patient: Devantae Babe  Procedure(s) Performed: TRANSESOPHAGEAL ECHOCARDIOGRAM (TEE) (N/A ) BUBBLE STUDY     Patient location during evaluation: Endoscopy Anesthesia Type: MAC Level of consciousness: awake and alert Pain management: pain level controlled Vital Signs Assessment: post-procedure vital signs reviewed and stable Respiratory status: spontaneous breathing, nonlabored ventilation and respiratory function stable Cardiovascular status: stable and blood pressure returned to baseline Postop Assessment: no apparent nausea or vomiting Anesthetic complications: no   No complications documented.  Last Vitals:  Vitals:   10/26/20 0910 10/26/20 0920  BP: 116/67 133/77  Pulse: 83 92  Resp: 17 19  Temp: 36.8 C   SpO2: 96% 99%    Last Pain:  Vitals:   10/26/20 0930  TempSrc:   PainSc: 0-No pain                 Ural Acree,W. EDMOND

## 2020-10-26 NOTE — CV Procedure (Signed)
    TRANSESOPHAGEAL ECHOCARDIOGRAM   NAME:  Perry Lewis    MRN: [id number] DOB:  Nov 14, 1965    ADMIT DATE: 10/26/2020  INDICATIONS: Severe MR  PROCEDURE:   Informed consent was obtained prior to the procedure. The risks, benefits and alternatives for the procedure were discussed and the patient comprehended these risks.  Risks include, but are not limited to, cough, sore throat, vomiting, nausea, somnolence, esophageal and stomach trauma or perforation, bleeding, low blood pressure, aspiration, pneumonia, infection, trauma to the teeth and death.    Procedural time out performed. The oropharynx was anesthetized with topical 1% benzocaine.    Anesthesia was administered by Dr. Sampson Goon.  The patient was administered 442 mg of propofol and 0 mg of lidocaine to achieve and maintain moderate conscious sedation.  The patient's heart rate, blood pressure, and oxygen saturation are monitored continuously during the procedure. The period of conscious sedation is 25 minutes, of which I was present face-to-face 100% of this time.   The transesophageal probe was inserted in the esophagus and stomach without difficulty and multiple views were obtained.   COMPLICATIONS:    There were no immediate complications.  KEY FINDINGS:  1. 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 2. LVEF 60% with mild LV dilation.  3. Full report to follow. 4. Further management per primary team.   Lenna Gilford. Flora Lipps, MD, [facility]  [facility]  [address], [address] [place], [place] [address] [phone]  9:16 AM

## 2020-10-26 NOTE — Anesthesia Procedure Notes (Signed)
Procedure Name: MAC Date/Time: 10/26/2020 8:33 AM Performed by: Kathryne Hitch, CRNA Pre-anesthesia Checklist: Patient identified, Emergency Drugs available, Suction available and Patient being monitored Patient Re-evaluated:Patient Re-evaluated prior to induction Oxygen Delivery Method: Nasal cannula Preoxygenation: Pre-oxygenation with 100% oxygen Induction Type: IV induction Placement Confirmation: positive ETCO2 Dental Injury: Teeth and Oropharynx as per pre-operative assessment

## 2020-10-26 NOTE — Discharge Instructions (Signed)
Transesophageal Echocardiogram Transesophageal echocardiogram (TEE) is a test that uses sound waves to take pictures of your heart. TEE is done by passing a small probe attached to a flexible tube down the part of the body that moves food from your mouth to your stomach (esophagus). The pictures give clear images of your heart. This can help your doctor see if there are problems with your heart. Tell a doctor about:  Any allergies you have.  All medicines you are taking. This includes vitamins, herbs, eye drops, creams, and over-the-counter medicines.  Any problems you or family members have had with anesthetic medicines.  Any blood disorders you have.  Any surgeries you have had.  Any medical conditions you have.  Any swallowing problems.  Whether you have or have had a blockage in the part of the body that moves food from your mouth to your stomach.  Whether you are pregnant or may be pregnant. What are the risks? In general, this is a safe procedure. But, problems may occur, such as:  Damage to nearby structures or organs.  A tear in the part of the body that moves food from your mouth to your stomach.  Irregular heartbeat.  Hoarse voice or trouble swallowing.  Bleeding. What happens before the procedure? Medicines  Ask your doctor about changing or stopping: ? Your normal medicines. ? Vitamins, herbs, and supplements. ? Over-the-counter medicines.  Do not take aspirin or ibuprofen unless you are told to. General instructions  Follow instructions from your doctor about what you cannot eat or drink.  You will take out any dentures or dental retainers.  Plan to have a responsible adult take you home from the hospital or clinic.  Plan to have a responsible adult care for you for the time you are told after you leave the hospital or clinic. This is important. What happens during the procedure?  An IV will be put into one of your veins.  You may be given: ? A  sedative. This medicine helps you relax. ? A medicine to numb the back of your throat. This may be sprayed or gargled.  Your blood pressure, heart rate, and breathing will be watched.  You may be asked to lie on your left side.  A bite block will be placed in your mouth. This keeps you from biting the tube.  The tip of the probe will be placed into the back of your mouth.  You will be asked to swallow.  Your doctor will take pictures of your heart.  The probe and bite block will be taken out after the test is done. The procedure may vary among doctors and hospitals.   What can I expect after the procedure?  You will be monitored until you leave the hospital or clinic. This includes checking your blood pressure, heart rate, breathing rate, and blood oxygen level.  Your throat may feel sore and numb. This will get better over time. You will not be allowed to eat or drink until the numbness has gone away.  It is common to have a sore throat for a day or two.  It is up to you to get the results of your procedure. Ask how to get your results when they are ready. Follow these instructions at home:  If you were given a sedative during your procedure, do not drive or use machines until your doctor says that it is safe.  Return to your normal activities when your doctor says that it is safe.    Keep all follow-up visits. Summary  TEE is a test that uses sound waves to take pictures of your heart.  You will be given a medicine to help you relax.  Do not drive or use machines until your doctor says that it is safe. This information is not intended to replace advice given to you by your health care provider. Make sure you discuss any questions you have with your health care provider. Document Revised: 03/10/2020 Document Reviewed: 03/10/2020 Elsevier Patient Education  2021 [employer]  

## 2020-10-27 ENCOUNTER — Encounter (HOSPITAL_COMMUNITY): Payer: Self-pay | Admitting: Cardiovascular Disease

## 2020-11-02 ENCOUNTER — Encounter: Payer: Self-pay | Admitting: *Deleted

## 2020-11-02 ENCOUNTER — Encounter: Payer: Self-pay | Admitting: Thoracic Surgery (Cardiothoracic Vascular Surgery)

## 2020-11-02 ENCOUNTER — Other Ambulatory Visit: Payer: Self-pay | Admitting: *Deleted

## 2020-11-02 ENCOUNTER — Other Ambulatory Visit: Payer: Self-pay

## 2020-11-02 ENCOUNTER — Ambulatory Visit (INDEPENDENT_AMBULATORY_CARE_PROVIDER_SITE_OTHER): Payer: BC Managed Care – PPO | Admitting: Thoracic Surgery (Cardiothoracic Vascular Surgery)

## 2020-11-02 VITALS — BP 169/91 | HR 88 | Resp 20 | Wt 171.0 lb

## 2020-11-02 DIAGNOSIS — I341 Nonrheumatic mitral (valve) prolapse: Secondary | ICD-10-CM

## 2020-11-02 DIAGNOSIS — I340 Nonrheumatic mitral (valve) insufficiency: Secondary | ICD-10-CM

## 2020-11-02 NOTE — Progress Notes (Signed)
[address]       [place] [address]             [phone]     CARDIOTHORACIC SURGERY OFFICE NOTE  Primary Cardiologist is O'Neal, Ronnald Ramp, MD PCP is Eartha Inch, MD   HPI:  Patient is a 55 year old male with history of mitral valve prolapse, mitral regurgitation and rheumatoid arthritis who returns the office today for follow-up of mitral regurgitation.  He was initially seen in consultation on September 28, 2020.  Since then he was evaluated by Dr. Bufford Buttner who performed transesophageal echocardiogram on October 26, 2020.  TEE confirmed the presence of mitral valve prolapse with a very large prolapsing segment involving the majority of the middle scallop (P2) of the posterior leaflet causing severe (4+) mitral regurgitation.  Regurgitant volume was calculated greater than 100 mL with regurgitant fraction 64% and systolic flow reversal in 3 out of 4 pulmonary veins.  Left ventricular systolic function remains normal with ejection fraction 60 to 65%.  There was moderate left atrial enlargement.  No other significant abnormalities were noted.  The patient underwent gated coronary CT angiogram which revealed normal coronary artery anatomy with no significant coronary artery disease and calcium score of 0.  Patient returns to the office today for follow-up to discuss treatment options further.  He continues to feel well and he specifically denies any symptoms of exertional shortness of breath.  He is eager to proceed with elective surgical repair in the near future.   Current Outpatient Medications  Medication Sig Dispense Refill  . folic acid (FOLVITE) 1 MG tablet Take 2 mg by mouth daily.    . methotrexate (RHEUMATREX) 2.5 MG tablet Take 15 mg by mouth every Tuesday. Caution:Chemotherapy. Protect from light. 6 weekly    . metoprolol tartrate (LOPRESSOR) 100 MG tablet Take 1 tablet ( ) by mouth 2 hours prior to cardiac CT scan. (Patient not taking: Reported  on 10/13/2020) 1 tablet 1   No current facility-administered medications for this visit.      Physical Exam:   There were no vitals taken for this visit.  General:  Well-appearing  Chest:   Clear to auscultation  CV:   Regular rate and rhythm with grade 4/6 holosystolic murmur heard all across the precordium  Incisions:  n/a  Abdomen:  Soft nontender  Extremities:  Warm and well-perfused  Diagnostic Tests:   TRANSESOPHOGEAL ECHO REPORT       Patient Name:  Perry Lewis Date of Exam: 10/26/2020  Medical Rec #: [id number]  Height:    69.0 in  Accession #:  [id number] Weight:    170.0 lb  Date of Birth: January 09, 1966 BSA:     1.928 m  Patient Age:  54 years  BP:      121/80 mmHg  Patient Gender: M      HR:      92 bpm.  Exam Location: Inpatient   Procedure: Transesophageal Echo, Color Doppler, Cardiac Doppler, Saline  Contrast       Bubble Study and 3D Echo   Indications:   Mitral Regurgitation    History:     Patient has no prior history of Echocardiogram  examinations.          Mitral Valve Prolapse.    Sonographer:   Thurman Coyer RDCS (AE)  Referring Phys: [id number] Ronnald Ramp O'NEAL  Diagnosing Phys: Lennie Odor MD   PROCEDURE: After discussion of the risks and benefits of a  TEE, an  informed consent was obtained from the patient. TEE procedure time was 25  minutes. The transesophogeal probe was passed without difficulty through  the esophogus of the patient. Imaged  were obtained with the patient in a left lateral decubitus position. Local  oropharyngeal anesthetic was provided with Cetacaine. Sedation performed  by different physician. The patient was monitored while under deep  sedation. Anesthestetic sedation was  provided intravenously by Anesthesiology: 442mg  of Propofol. Image quality  was excellent. The patient's vital signs; including heart rate, blood  pressure, and oxygen  saturation; remained stable throughout the procedure.  The patient developed no  complications during the procedure.   IMPRESSIONS    1. There is severe prolapse of the entire posterior mitral valve leaflet  with severe eccentric mitral regurgitation. There is no flail chord. 2D  PISA radius 2.6 cm, 2D ERO (angle correction 57) 1.02 cm2, R vol 153 cc.  By continuity R vol 114 cc, RF 64%.  There is systolic flow reversal in 3/4 pulmonary veins. Overall, severe  mitral regurgitation is present. LVEF is 60%. The mitral valve is  myxomatous. Severe mitral valve regurgitation. No evidence of mitral  stenosis.  2. Left ventricular ejection fraction, by estimation, is 60 to 65%. Left  ventricular ejection fraction by 3D volume is 60 %. The left ventricle has  normal function. The left ventricle has no regional wall motion  abnormalities.  3. Right ventricular systolic function is normal. The right ventricular  size is normal.  4. Left atrial size was mild to moderately dilated. No left atrial/left  atrial appendage thrombus was detected. The LAA emptying velocity was 51  cm/s.  5. The aortic valve is tricuspid. Aortic valve regurgitation is not  visualized. No aortic stenosis is present.  6. Agitated saline contrast bubble study was negative, with no evidence  of any interatrial shunt.   FINDINGS  Left Ventricle: Left ventricular ejection fraction, by estimation, is 60  to 65%. Left ventricular ejection fraction by 3D volume is 60 %. The left  ventricle has normal function. The left ventricle has no regional wall  motion abnormalities. The left  ventricular internal cavity size was normal in size.   Right Ventricle: The right ventricular size is normal. No increase in  right ventricular wall thickness. Right ventricular systolic function is  normal.   Left Atrium: Left atrial size was mild to moderately dilated. No left  atrial/left atrial appendage thrombus was detected.  The LAA emptying  velocity was 51 cm/s.   Right Atrium: Right atrial size was normal in size. Prominent Eustachian  valve.   Pericardium: There is no evidence of pericardial effusion.   Mitral Valve: There is severe prolapse of the entire posterior mitral  valve leaflet with severe eccentric mitral regurgitation. There is no  flail chord. 2D PISA radius 2.6 cm, 2D ERO (angle correction 57) 1.02 cm2,  R vol 153 cc. By continuity R vol 114  cc, RF 64%. There is systolic flow reversal in 3/4 pulmonary veins.  Overall, severe mitral regurgitation is present. LVEF is 60%. The mitral  valve is myxomatous. Severe mitral valve regurgitation. No evidence of  mitral valve stenosis.   Tricuspid Valve: The tricuspid valve is grossly normal. Tricuspid valve  regurgitation is trivial. No evidence of tricuspid stenosis.   Aortic Valve: The aortic valve is tricuspid. Aortic valve regurgitation is  not visualized. No aortic stenosis is present.   Pulmonic Valve: The pulmonic valve was grossly normal. Pulmonic valve  regurgitation is trivial. No evidence of pulmonic stenosis.   Aorta: The aortic root and ascending aorta are structurally normal, with  no evidence of dilitation. There is minimal (Grade I) plaque involving the  descending aorta.   Venous: A pattern of systolic flow reversal, suggestive of severe mitral  regurgitation is recorded from the left upper pulmonary vein, the right  upper pulmonary vein and the right lower pulmonary vein.   IAS/Shunts: There is right bowing of the interatrial septum, suggestive of  elevated left atrial pressure. No atrial level shunt detected by color  flow Doppler. Agitated saline contrast was given intravenously to evaluate  for intracardiac shunting.  Agitated saline contrast bubble study was negative, with no evidence of  any interatrial shunt.     LEFT VENTRICLE  PLAX 2D  LVOT diam:   2.00 cm  LV SV:     64  LV SV Index:  33        3D Volume EF  LVOT Area:   3.14 cm    LV 3D EF:  Left                       ventricular                       ejection                       fraction by                       3D volume                       is 60 %.                 LV 3D EDV:  159.31 ml                 LV 3D ESV:  62.44 ml                   3D Volume EF:                 3D EF:    60 %   AORTIC VALVE  LVOT Vmax:  130.00 cm/s  LVOT Vmean: 77.900 cm/s  LVOT VTI:  0.203 m    AORTA  Ao Root diam: 3.40 cm  Ao Asc diam: 3.10 cm   MR Peak grad:  102.4 mmHg TRICUSPID VALVE  MR Mean grad:  62.0 mmHg  TR Peak grad:  16.3 mmHg  MR Vmax:     506.00 cm/s TR Vmax:    202.00 cm/s  MR Vmean:    374.0 cm/s  MR PISA:     42.47 cm  SHUNTS  MR PISA Eff ROA: 323 mm   Systemic VTI: 0.20 m  MR PISA Radius: 2.60 cm   Systemic Diam: 2.00 cm   Lennie Odor MD  Electronically signed by Lennie Odor MD  Signature Date/Time: 10/26/2020/11:19:19 AM     Cardiac TAVR CT  TECHNIQUE: The patient was scanned on a [employer]. A 90 kV retrospective scan was triggered in the descending thoracic aorta at 111 HU's. Gantry rotation speed was 250 msecs and collimation was .6 mm. No beta blockade or nitro were given. The 3D data set was reconstructed in 5% intervals of the R-R cycle. Systolic and diastolic phases were analyzed on a  dedicated work station using MPR, MIP and VRT modes. The patient received 80 cc of contrast.  FINDINGS: Image quality: Excellent.  Noise artifact is: Limited.  Coronary Arteries: Normal coronary origins. Right dominance. Coronary calcium score of 0.  Left main: The left main is a large caliber vessel with a normal take off from the left coronary cusp that  bifurcates to form a left anterior descending artery and a left circumflex artery. There is no plaque or stenosis.  Left anterior descending artery: The LAD is patent without evidence of plaque or stenosis. The LAD gives off 2 patent diagonal branches.  Left circumflex artery: The LCX is non-dominant and patent with no evidence of plaque or stenosis. The LCX gives off 2 patent obtuse marginal branches.  Right coronary artery: The RCA is dominant with normal take off from the right coronary cusp. There is no evidence of plaque or stenosis. The RCA terminates as a PDA and right posterolateral branch without evidence of plaque or stenosis.  Cardiac Morphology:  Right Atrium: Right atrial size is within normal limits.  Right Ventricle: The right ventricular cavity is within normal limits.  Left Atrium: Left atrial size is mildly dilated with no left atrial appendage filling defect. A small PFO is present.  Left Ventricle: The ventricular cavity size is within normal limits. There are no stigmata of prior infarction. There is no abnormal filling defect. Normal left ventricular function, LVEF=71%. No regional wall motion abnormalities.  Pulmonary arteries: Normal in size without proximal filling defect.  Pulmonary veins: Normal pulmonary venous drainage.  Pericardium: Normal thickness with no significant effusion or calcium present.  Aortic Valve: Tricuspid without any significant calcifications.  Mitral Valve: The mitral valve is myxomatous. There is severe prolapse of the entire posterior mitral valve leaflet. There does not appear to be a flail segment. There is mild posterior mitral annular calcification.  Aorta: Normal caliber without significant calcification.  Extra-cardiac findings: See attached radiology report for non-cardiac structures.  IMPRESSION: 1. Coronary calcium score of 0. Normal coronary origins. Right dominance.  2. Normal coronary  arteries.  3. Myxomatous mitral valve. Severe prolapse of the entire posterior mitral valve leaflet. No apparent flail segment. Mild posterior mitral annular calcification.  4. Small PFO.  5. Normal LV function, LVEF=71%. No regional wall motion abnormalities.  Gerri Spore T. Flora Lipps, MD   Electronically Signed   By: Lennie Odor   On: 10/07/2020 11:55    CT ANGIOGRAPHY CHEST, ABDOMEN AND PELVIS  TECHNIQUE: Non-contrast CT of the chest was initially obtained.  Multidetector CT imaging through the chest, abdomen and pelvis was performed using the standard protocol during bolus administration of intravenous contrast. Multiplanar reconstructed images and MIPs were obtained and reviewed to evaluate the vascular anatomy.  CONTRAST:  OMNIPAQUE IOHEXOL 350 MG/ML SOLN  COMPARISON:  CT the chest, abdomen and pelvis 11/27/2018.  FINDINGS: CTA CHEST FINDINGS  Cardiovascular: Heart size is enlarged with left atrial dilatation. There is no significant pericardial fluid, thickening or pericardial calcification. No atherosclerotic calcifications in the thoracic aorta or the coronary arteries. Mild calcifications of the mitral annulus. No aneurysm or dissection of the thoracic aorta. Ascending thoracic aorta, mid aortic arch and descending thoracic aorta measure 35 mm, 26 mm and 26 mm in diameter respectively.  Mediastinum/Lymph Nodes: No pathologically enlarged mediastinal or hilar lymph nodes. Esophagus is unremarkable in appearance. No axillary lymphadenopathy.  Lungs/Pleura: No suspicious appearing pulmonary nodules or masses are noted. No acute consolidative airspace disease. No pleural effusions.  Musculoskeletal/Soft  Tissues: There are no aggressive appearing lytic or blastic lesions noted in the visualized portions of the skeleton.  CTA ABDOMEN AND PELVIS FINDINGS  Hepatobiliary: No suspicious cystic or solid hepatic lesions. No intra or  extrahepatic biliary ductal dilatation. Gallbladder is normal in appearance.  Pancreas: No pancreatic mass. No pancreatic ductal dilatation. No pancreatic or peripancreatic fluid collections or inflammatory changes.  Spleen: Unremarkable.  Adrenals/Urinary Tract: Bilateral kidneys and adrenal glands are normal in appearance. No hydroureteronephrosis. Urinary bladder is normal in appearance.  Stomach/Bowel: Normal appearance of the stomach. No pathologic dilatation of small bowel or colon. 3.9 cm diverticulum extending off the medial aspect of the second portion of the duodenum, without surrounding inflammatory changes to suggest an associated diverticulitis at this time. Status post appendectomy.  Vascular/Lymphatic: No significant atherosclerotic disease, aneurysm or dissection noted in the abdominal or pelvic vasculature. No lymphadenopathy noted in the abdomen or pelvis.  Reproductive: Prostate gland and seminal vesicles are unremarkable in appearance.  Other: No significant volume of ascites.  No pneumoperitoneum.  Musculoskeletal: There are no aggressive appearing lytic or blastic lesions noted in the visualized portions of the skeleton.  IMPRESSION: 1. Cardiomegaly with left atrial dilatation. 2. No significant atherosclerotic disease, aneurysm or dissection noted in the thoracoabdominal aorta. 3. Additional incidental findings, as above.   Electronically Signed   By: Trudie Reed M.D.   On: 10/07/2020 11:04    Impression:  Patient has mitral valve prolapse with stage C1 severe asymptomatic primary mitral regurgitation.  I have personally reviewed the patient's recent transesophageal echocardiogram, coronary CT angiogram, and CT angiogram of the chest, abdomen, and pelvis.  TEE confirmed the presence of myxomatous degenerative disease with a very large prolapsing segment involving the majority of the middle scallop of the posterior leaflet causing  severe mitral regurgitation.  There is no significant calcification and no other complicating features.  Left ventricular function appears normal.  Coronary CT angiogram reveals no evidence for coronary artery disease and normal coronary artery anatomy.  Based upon review of the patient's TEE I feel there is greater than 98% likelihood of successful and durable long-term repair with anticipated risk of operative mortality less than 1%.  CT angiogram of the chest, abdomen, and pelvis reveal no contraindication to peripheral cannulation for surgery.  Plan:  The patient was counseled at length regarding diagnosis of severe primary mitral regurgitation.  We reviewed the results of their diagnostic tests including images from the most recent echocardiogram.  We discussed the natural history of mitral regurgitation as well as alternative treatment strategies.  We discussed the impact of his age, current state of health, and the lack of significant comorbid medical problems on clinical decision making.  We went on to discuss the indications, risks and potential benefits of mitral valve repair as well as the timing of surgical intervention.  The rationale for elective surgery has been explained, including a comparison between surgery and continued medical therapy with close follow-up.  The likelihood of successful and durable mitral valve repair has been discussed with particular reference to the findings of the most recent echocardiogram.  Based upon these findings and previous experience, I have quoted a greater than 98 percent likelihood of successful valve repair with less than 1 percent risk of mortality or major morbidity.  Alternative surgical approaches have been discussed including a comparison between conventional sternotomy and minimally-invasive techniques.  The relative risks and benefits of each have been reviewed as they pertain to the patient's specific circumstances, and expectations  for the patient's  postoperative convalescence has been discussed.  The patient desires to proceed with surgery sometime in May.  We tentatively plan to proceed on Dec 15, 2020.  Patient will return for follow-up prior to surgery on Dec 14, 2020.      I spent in excess of 15 minutes during the conduct of this office consultation and >50% of this time involved direct face-to-face encounter with the patient for counseling and/or coordination of their care.   Salvatore Decent. Cornelius Moras, MD 11/02/2020 1:40 PM

## 2020-11-02 NOTE — Patient Instructions (Signed)
Stop taking methotrexate 1 week prior to surgery  Continue taking all other medications without change through the day before surgery.  Make sure to bring all of your medications with you when you come for your Pre-Admission Testing appointment at [facility] Short-Stay Department.  Have nothing to eat or drink after midnight the night before surgery.  On the morning of surgery do not take any medications.  At your appointment for Pre-Admission Testing at the [facility] Short-Stay Department you will be asked to sign permission forms for your upcoming surgery.  By definition your signature on these forms implies that you and/or your designee provide full informed consent for your planned surgical procedure(s), that alternative treatment options have been discussed, that you understand and accept any and all potential risks, and that you have some understanding of what to expect for your post-operative convalescence.  For elective mitral valve repair or replacement potential operative risks include but are not limited to at least some risk of death, stroke or other neurologic complication, myocardial infarction, congestive heart failure, respiratory failure, renal failure, bleeding requiring transfusion and/or reexploration, arrhythmia, heart block or bradycardia requiring permanent pacemaker insertion, infection or other wound complications, pneumonia, pleural and/or pericardial effusion, pulmonary embolus, aortic dissection or other major vascular complication, or other immediate or delayed complications related to valve repair or replacement including but not limited to recurrent or persistent mitral regurgitation and/or mitral stenosis, LV outflow tract obstruction, aortic insufficiency, paravalvular leak, posterior AV groove disruption, late structural valve deterioration and failure, thrombosis, embolization, or endocarditis.  Specific risks potentially related to  the minimally-invasive approach include but are not limited to risk of conversion to full or partial sternotomy, aortic dissection or other major vascular complication, unilateral acute lung injury or pulmonary edema, phrenic nerve dysfunction or paralysis, rib fracture, chronic pain, lung hernia, or lymphocele.  Please call to schedule a follow-up appointment in our office prior to surgery if you have any unresolved questions about your planned surgical procedure, the associated risks, alternative treatment options, and/or expectations for your post-operative recovery.

## 2020-11-12 DIAGNOSIS — J0140 Acute pansinusitis, unspecified: Secondary | ICD-10-CM | POA: Diagnosis not present

## 2020-11-23 DIAGNOSIS — Z79899 Other long term (current) drug therapy: Secondary | ICD-10-CM | POA: Diagnosis not present

## 2020-11-23 DIAGNOSIS — M0579 Rheumatoid arthritis with rheumatoid factor of multiple sites without organ or systems involvement: Secondary | ICD-10-CM | POA: Diagnosis not present

## 2020-11-25 ENCOUNTER — Other Ambulatory Visit: Payer: Self-pay | Admitting: Cardiovascular Disease

## 2020-12-10 DIAGNOSIS — M0579 Rheumatoid arthritis with rheumatoid factor of multiple sites without organ or systems involvement: Secondary | ICD-10-CM | POA: Diagnosis not present

## 2020-12-10 DIAGNOSIS — Z79899 Other long term (current) drug therapy: Secondary | ICD-10-CM | POA: Diagnosis not present

## 2020-12-10 NOTE — Pre-Procedure Instructions (Signed)
Surgical Instructions    Your procedure is scheduled on Tuesday May 17th.  Report to [facility] Main Entrance "A" at 0530 A.M., then check in with the Admitting office.  Call this number if you have problems the morning of surgery:  [phone]   If you have any questions prior to your surgery date call [phone]: Open Monday-Friday 8am-4pm    Remember:  Do not eat or drink after midnight the night before your surgery     Take these medicines the morning of surgery with A SIP OF WATER  --  As of today, STOP taking any Aspirin (unless otherwise instructed by your surgeon) Aleve, Naproxen, Ibuprofen, Motrin, Advil, Goody's, BC's, all herbal medications, fish oil, and all vitamins.                     Do NOT Smoke (Tobacco/Vaping) or drink Alcohol 24 hours prior to your procedure.  If you use a CPAP at night, you may bring all equipment for your overnight stay.   Contacts, glasses, piercing's, hearing aid's, dentures or partials may not be worn into surgery, please bring cases for these belongings.    For patients admitted to the hospital, discharge time will be determined by your treatment team.   Patients discharged the day of surgery will not be allowed to drive home, and someone needs to stay with them for 24 hours.    Special instructions:   [facility]- Preparing For Surgery  Before surgery, you can play an important role. Because skin is not sterile, your skin needs to be as free of germs as possible. You can reduce the number of germs on your skin by washing with CHG (chlorahexidine gluconate) Soap before surgery.  CHG is an antiseptic cleaner which kills germs and bonds with the skin to continue killing germs even after washing.    Oral Hygiene is also important to reduce your risk of infection.  Remember - BRUSH YOUR TEETH THE MORNING OF SURGERY WITH YOUR REGULAR TOOTHPASTE  Please do not use if you have an allergy to CHG or antibacterial soaps. If your skin becomes  reddened/irritated stop using the CHG.  Do not shave (including legs and underarms) for at least 48 hours prior to first CHG shower. It is OK to shave your face.  Please follow these instructions carefully.   1. Shower the NIGHT BEFORE SURGERY and the MORNING OF SURGERY  2. If you chose to wash your hair, wash your hair first as usual with your normal shampoo.  3. After you shampoo, rinse your hair and body thoroughly to remove the shampoo.  4. Use CHG Soap as you would any other liquid soap. You can apply CHG directly to the skin and wash gently with a scrungie or a clean washcloth.   5. Apply the CHG Soap to your body ONLY FROM THE NECK DOWN.  Do not use on open wounds or open sores. Avoid contact with your eyes, ears, mouth and genitals (private parts). Wash Face and genitals (private parts)  with your normal soap.   6. Wash thoroughly, paying special attention to the area where your surgery will be performed.  7. Thoroughly rinse your body with warm water from the neck down.  8. DO NOT shower/wash with your normal soap after using and rinsing off the CHG Soap.  9. Pat yourself dry with a CLEAN TOWEL.  10. Wear CLEAN PAJAMAS to bed the night before surgery  11. Place CLEAN SHEETS on your  bed the night before your surgery  12. DO NOT SLEEP WITH PETS.   Day of Surgery: Shower with CHG soap. Do not wear jewelry, make up, or nail polish Do not wear lotions, powders, perfumes/colognes, or deodorant. Do not shave 48 hours prior to surgery.  Men may shave face and neck. Do not bring valuables to the hospital. [facility] is not responsible for any belongings or valuables. Wear Clean/Comfortable clothing the morning of surgery Remember to brush your teeth WITH YOUR REGULAR TOOTHPASTE.   Please read over the following fact sheets that you were given.

## 2020-12-11 ENCOUNTER — Encounter (HOSPITAL_COMMUNITY)
Admission: RE | Admit: 2020-12-11 | Discharge: 2020-12-11 | Disposition: A | Discharge status: Home / Self Care | Payer: BC Managed Care – PPO | Source: Ambulatory Visit | Attending: Thoracic Surgery (Cardiothoracic Vascular Surgery) | Admitting: Thoracic Surgery (Cardiothoracic Vascular Surgery)

## 2020-12-11 ENCOUNTER — Ambulatory Visit (HOSPITAL_COMMUNITY)
Admission: RE | Admit: 2020-12-11 | Discharge: 2020-12-11 | Disposition: A | Discharge status: Home / Self Care | Payer: BC Managed Care – PPO | Source: Ambulatory Visit | Attending: Thoracic Surgery (Cardiothoracic Vascular Surgery) | Admitting: Thoracic Surgery (Cardiothoracic Vascular Surgery)

## 2020-12-11 ENCOUNTER — Other Ambulatory Visit (HOSPITAL_COMMUNITY)
Admission: RE | Admit: 2020-12-11 | Discharge: 2020-12-11 | Disposition: A | Discharge status: Home / Self Care | Payer: BC Managed Care – PPO | Source: Ambulatory Visit | Attending: Thoracic Surgery (Cardiothoracic Vascular Surgery) | Admitting: Thoracic Surgery (Cardiothoracic Vascular Surgery)

## 2020-12-11 ENCOUNTER — Other Ambulatory Visit: Payer: Self-pay

## 2020-12-11 ENCOUNTER — Encounter (HOSPITAL_COMMUNITY): Payer: Self-pay

## 2020-12-11 DIAGNOSIS — I340 Nonrheumatic mitral (valve) insufficiency: Secondary | ICD-10-CM | POA: Insufficient documentation

## 2020-12-11 DIAGNOSIS — Z20822 Contact with and (suspected) exposure to covid-19: Secondary | ICD-10-CM | POA: Diagnosis not present

## 2020-12-11 DIAGNOSIS — Z01818 Encounter for other preprocedural examination: Secondary | ICD-10-CM | POA: Diagnosis not present

## 2020-12-11 DIAGNOSIS — I341 Nonrheumatic mitral (valve) prolapse: Secondary | ICD-10-CM

## 2020-12-11 LAB — CBC
HCT: 39.5 % (ref 39.0–52.0)
Hemoglobin: 13.7 g/dL (ref 13.0–17.0)
MCH: 31.9 pg (ref 26.0–34.0)
MCHC: 34.7 g/dL (ref 30.0–36.0)
MCV: 91.9 fL (ref 80.0–100.0)
Platelets: 291 10*3/uL (ref 150–400)
RBC: 4.3 MIL/uL (ref 4.22–5.81)
RDW: 13.1 % (ref 11.5–15.5)
WBC: 5.7 10*3/uL (ref 4.0–10.5)
nRBC: 0 % (ref 0.0–0.2)

## 2020-12-11 LAB — COMPREHENSIVE METABOLIC PANEL
ALT: 35 U/L (ref 0–44)
AST: 27 U/L (ref 15–41)
Albumin: 4.2 g/dL (ref 3.5–5.0)
Alkaline Phosphatase: 63 U/L (ref 38–126)
Anion gap: 6 (ref 5–15)
BUN: 8 mg/dL (ref 6–20)
CO2: 25 mmol/L (ref 22–32)
Calcium: 9.7 mg/dL (ref 8.9–10.3)
Chloride: 102 mmol/L (ref 98–111)
Creatinine, Ser: 0.63 mg/dL (ref 0.61–1.24)
GFR, Estimated: >60 mL/min (ref >60)
Glucose, Bld: 78 mg/dL (ref 70–99)
Potassium: 4.2 mmol/L (ref 3.5–5.1)
Sodium: 133 mmol/L — ABNORMAL LOW (ref 135–145)
Total Bilirubin: 0.9 mg/dL (ref 0.3–1.2)
Total Protein: 7 g/dL (ref 6.5–8.1)

## 2020-12-11 LAB — URINALYSIS, ROUTINE W REFLEX MICROSCOPIC
Bilirubin Urine: NEGATIVE
Glucose, UA: NEGATIVE mg/dL
Hgb urine dipstick: NEGATIVE
Ketones, ur: NEGATIVE mg/dL
Leukocytes,Ua: NEGATIVE
Nitrite: NEGATIVE
Protein, ur: NEGATIVE mg/dL
Specific Gravity, Urine: 1.008 (ref 1.005–1.030)
pH: 8 (ref 5.0–8.0)

## 2020-12-11 LAB — BLOOD GAS, ARTERIAL
Acid-Base Excess: 0.4 mmol/L (ref 0.0–2.0)
Bicarbonate: 24.5 mmol/L (ref 20.0–28.0)
FIO2: 21
O2 Saturation: 98.3 %
Patient temperature: 37
pCO2 arterial: 39 mmHg (ref 32.0–48.0)
pH, Arterial: 7.414 (ref 7.350–7.450)
pO2, Arterial: 114 mmHg — ABNORMAL HIGH (ref 83.0–108.0)

## 2020-12-11 LAB — HEMOGLOBIN A1C
Hgb A1c MFr Bld: 5.5 % (ref 4.8–5.6)
Mean Plasma Glucose: 111.15 mg/dL

## 2020-12-11 LAB — APTT: aPTT: 36 seconds (ref 24–36)

## 2020-12-11 LAB — PROTIME-INR
INR: 1 (ref 0.8–1.2)
Prothrombin Time: 13.3 seconds (ref 11.4–15.2)

## 2020-12-11 LAB — TYPE AND SCREEN
ABO/RH(D): O POS
Antibody Screen: NEGATIVE

## 2020-12-11 LAB — SARS CORONAVIRUS 2 (TAT 6-24 HRS): SARS Coronavirus 2: NEGATIVE

## 2020-12-11 LAB — SURGICAL PCR SCREEN
MRSA, PCR: NEGATIVE
Staphylococcus aureus: NEGATIVE

## 2020-12-11 NOTE — Progress Notes (Signed)
Pre cabg has been completed.   Preliminary results in CV Proc.   Blanch Media 12/11/2020 10:11 AM

## 2020-12-11 NOTE — Pre-Procedure Instructions (Signed)
Surgical Instructions    Your procedure is scheduled on Tuesday May 17th.  Report to [facility] Main Entrance "A" at 0530 A.M., then check in with the Admitting office.  Call this number if you have problems the morning of surgery:  [phone]   If you have any questions prior to your surgery date call [phone]: Open Monday-Friday 8am-4pm    Remember:  Do not eat or drink after midnight the night before your surgery     Take these medicines the morning of surgery with A SIP OF WATER: NONE --  As of today, STOP taking any Aspirin (unless otherwise instructed by your surgeon) Aleve, Naproxen, Ibuprofen, Motrin, Advil, Goody's, BC's, all herbal medications, fish oil, and all vitamins.                     Do NOT Smoke (Tobacco/Vaping) or drink Alcohol 24 hours prior to your procedure.  If you use a CPAP at night, you may bring all equipment for your overnight stay.   Contacts, glasses, piercing's, hearing aid's, dentures or partials may not be worn into surgery, please bring cases for these belongings.    For patients admitted to the hospital, discharge time will be determined by your treatment team.   Patients discharged the day of surgery will not be allowed to drive home, and someone needs to stay with them for 24 hours.    Special instructions:   [facility]- Preparing For Surgery  Before surgery, you can play an important role. Because skin is not sterile, your skin needs to be as free of germs as possible. You can reduce the number of germs on your skin by washing with CHG (chlorahexidine gluconate) Soap before surgery.  CHG is an antiseptic cleaner which kills germs and bonds with the skin to continue killing germs even after washing.    Oral Hygiene is also important to reduce your risk of infection.  Remember - BRUSH YOUR TEETH THE MORNING OF SURGERY WITH YOUR REGULAR TOOTHPASTE  Please do not use if you have an allergy to CHG or antibacterial soaps. If your skin  becomes reddened/irritated stop using the CHG.  Do not shave (including legs and underarms) for at least 48 hours prior to first CHG shower. It is OK to shave your face.  Please follow these instructions carefully.   1. Shower the NIGHT BEFORE SURGERY and the MORNING OF SURGERY  2. If you chose to wash your hair, wash your hair first as usual with your normal shampoo.  3. After you shampoo, rinse your hair and body thoroughly to remove the shampoo.  4. Use CHG Soap as you would any other liquid soap. You can apply CHG directly to the skin and wash gently with a scrungie or a clean washcloth.   5. Apply the CHG Soap to your body ONLY FROM THE NECK DOWN.  Do not use on open wounds or open sores. Avoid contact with your eyes, ears, mouth and genitals (private parts). Wash Face and genitals (private parts)  with your normal soap.   6. Wash thoroughly, paying special attention to the area where your surgery will be performed.  7. Thoroughly rinse your body with warm water from the neck down.  8. DO NOT shower/wash with your normal soap after using and rinsing off the CHG Soap.  9. Pat yourself dry with a CLEAN TOWEL.  10. Wear CLEAN PAJAMAS to bed the night before surgery  11. Place CLEAN SHEETS on your  bed the night before your surgery  12. DO NOT SLEEP WITH PETS.   Day of Surgery: Shower with CHG soap. Do not wear jewelry, make up, or nail polish Do not wear lotions, powders, perfumes/colognes, or deodorant. Do not shave 48 hours prior to surgery.  Men may shave face and neck. Do not bring valuables to the hospital. [facility] is not responsible for any belongings or valuables. Wear Clean/Comfortable clothing the morning of surgery Remember to brush your teeth WITH YOUR REGULAR TOOTHPASTE.   Please read over the following fact sheets that you were given.

## 2020-12-11 NOTE — Progress Notes (Signed)
PCP: Antony Haste, MD Cardiologist: Lennie Odor  EKG: 12/11/20 CXR: 12/11/20 ECHO: 05/19/20 Stress Test: denies Cardiac Cath: denies  Fasting Blood Sugar- na Checks Blood Sugar__na_ times a day  ASA/Blood Thinners: No  OSA/CPAP: No  Covid test 12/11/20 pending  Anesthesia Review: Yes, cardiac history  Patient denies shortness of breath, fever, cough, and chest pain at PAT appointment.  Patient verbalized understanding of instructions provided today at the PAT appointment.  Patient asked to review instructions at home and day of surgery.

## 2020-12-14 ENCOUNTER — Ambulatory Visit (INDEPENDENT_AMBULATORY_CARE_PROVIDER_SITE_OTHER): Payer: BC Managed Care – PPO | Admitting: Thoracic Surgery (Cardiothoracic Vascular Surgery)

## 2020-12-14 ENCOUNTER — Encounter: Payer: Self-pay | Admitting: Thoracic Surgery (Cardiothoracic Vascular Surgery)

## 2020-12-14 ENCOUNTER — Other Ambulatory Visit: Payer: Self-pay

## 2020-12-14 VITALS — BP 136/83 | HR 83 | Temp 98.1°F | Resp 20 | Wt 170.0 lb

## 2020-12-14 DIAGNOSIS — I340 Nonrheumatic mitral (valve) insufficiency: Secondary | ICD-10-CM

## 2020-12-14 DIAGNOSIS — I341 Nonrheumatic mitral (valve) prolapse: Secondary | ICD-10-CM

## 2020-12-14 MED ORDER — PLASMA-LYTE 148 IV SOLN
INTRAVENOUS | Status: DC
  Filled 2020-12-14: qty 2.5

## 2020-12-14 MED ORDER — EPINEPHRINE HCL 5 MG/250ML IV SOLN IN NS
0.0000 ug/min | INTRAVENOUS | Status: DC
  Filled 2020-12-14: qty 250

## 2020-12-14 MED ORDER — TRANEXAMIC ACID 1000 MG/10ML IV SOLN
1.5000 mg/kg/h | INTRAVENOUS | Status: AC
  Administered 2020-12-15: 1.5 mg/kg/h via INTRAVENOUS
  Filled 2020-12-14: qty 25

## 2020-12-14 MED ORDER — MANNITOL 20 % IV SOLN
Freq: Once | INTRAVENOUS | Status: DC
  Filled 2020-12-14: qty 13

## 2020-12-14 MED ORDER — INSULIN REGULAR(HUMAN) IN NACL 100-0.9 UT/100ML-% IV SOLN
INTRAVENOUS | Status: AC
  Administered 2020-12-15: 1 [IU]/h via INTRAVENOUS
  Filled 2020-12-14: qty 100

## 2020-12-14 MED ORDER — NOREPINEPHRINE 4 MG/250ML-% IV SOLN
0.0000 ug/min | INTRAVENOUS | Status: DC
Start: 2020-12-15 — End: 2020-12-15
  Filled 2020-12-14: qty 250

## 2020-12-14 MED ORDER — TRANEXAMIC ACID (OHS) PUMP PRIME SOLUTION
2.0000 mg/kg | INTRAVENOUS | Status: DC
  Filled 2020-12-14: qty 1.54

## 2020-12-14 MED ORDER — MILRINONE LACTATE IN DEXTROSE 20-5 MG/100ML-% IV SOLN
0.3000 ug/kg/min | INTRAVENOUS | Status: DC
  Filled 2020-12-14: qty 100

## 2020-12-14 MED ORDER — PHENYLEPHRINE HCL-NACL 20-0.9 MG/250ML-% IV SOLN
30.0000 ug/min | INTRAVENOUS | Status: AC
  Administered 2020-12-15: 40 ug/min via INTRAVENOUS
  Filled 2020-12-14: qty 250

## 2020-12-14 MED ORDER — NITROGLYCERIN IN D5W 200-5 MCG/ML-% IV SOLN
2.0000 ug/min | INTRAVENOUS | Status: DC
  Administered 2020-12-15: 10 ug/min via INTRAVENOUS
  Filled 2020-12-14: qty 250

## 2020-12-14 MED ORDER — VANCOMYCIN HCL 1000 MG IV SOLR
INTRAVENOUS | Status: DC
  Filled 2020-12-14: qty 1000

## 2020-12-14 MED ORDER — DEXMEDETOMIDINE HCL IN NACL 400 MCG/100ML IV SOLN
0.1000 ug/kg/h | INTRAVENOUS | Status: DC
  Filled 2020-12-14: qty 100

## 2020-12-14 MED ORDER — TRANEXAMIC ACID (OHS) BOLUS VIA INFUSION
15.0000 mg/kg | INTRAVENOUS | Status: AC
  Administered 2020-12-15: 1156.5 mg via INTRAVENOUS
  Filled 2020-12-14: qty 1157

## 2020-12-14 MED ORDER — SODIUM CHLORIDE 0.9 % IV SOLN
INTRAVENOUS | Status: DC
  Filled 2020-12-14: qty 30

## 2020-12-14 MED ORDER — VANCOMYCIN HCL 1250 MG/250ML IV SOLN
1250.0000 mg | INTRAVENOUS | Status: AC
  Administered 2020-12-15: 1250 mg via INTRAVENOUS
  Filled 2020-12-14: qty 250

## 2020-12-14 MED ORDER — CEFAZOLIN SODIUM-DEXTROSE 2-4 GM/100ML-% IV SOLN
2.0000 g | INTRAVENOUS | Status: AC
  Administered 2020-12-15: 2 g via INTRAVENOUS
  Filled 2020-12-14: qty 100

## 2020-12-14 MED ORDER — POTASSIUM CHLORIDE 2 MEQ/ML IV SOLN
80.0000 meq | INTRAVENOUS | Status: DC
  Filled 2020-12-14: qty 40

## 2020-12-14 MED ORDER — GLUTARALDEHYDE 0.625% SOAKING SOLUTION
TOPICAL | Status: DC
  Filled 2020-12-14: qty 50

## 2020-12-14 NOTE — Patient Instructions (Signed)
   Have nothing to eat or drink after midnight the night before surgery.  On the morning of surgery do not take any medications.   

## 2020-12-14 NOTE — Anesthesia Preprocedure Evaluation (Addendum)
Anesthesia Evaluation  Patient identified by MRN, date of birth, ID band Patient awake    Airway Mallampati: II  TM Distance: >3 FB Neck ROM: Full    Dental  (+) Teeth Intact   Pulmonary neg pulmonary ROS, former smoker,    Pulmonary exam normal        Cardiovascular + Valvular Problems/Murmurs MR  Rhythm:Regular Rate:Normal     Neuro/Psych negative neurological ROS  negative psych ROS   GI/Hepatic negative GI ROS, Neg liver ROS,   Endo/Other  negative endocrine ROS  Renal/GU negative Renal ROS  negative genitourinary   Musculoskeletal  (+) Arthritis , Rheumatoid disorders,    Abdominal (+)  Abdomen: soft. Bowel sounds: normal.  Peds  Hematology negative hematology ROS (+)   Anesthesia Other Findings   Reproductive/Obstetrics                            Anesthesia Physical Anesthesia Plan  ASA: III  Anesthesia Plan: General   Post-op Pain Management:    Induction: Intravenous  PONV Risk Score and Plan: 2 and Ondansetron, Midazolam and Treatment may vary due to age or medical condition  Airway Management Planned: Mask and Oral ETT  Additional Equipment: Arterial line, CVP, PA Cath, TEE, 3D TEE and Ultrasound Guidance Line Placement  Intra-op Plan:   Post-operative Plan: Possible Post-op intubation/ventilation  Informed Consent: I have reviewed the patients History and Physical, chart, labs and discussed the procedure including the risks, benefits and alternatives for the proposed anesthesia with the patient or authorized representative who has indicated his/her understanding and acceptance.     Dental advisory given  Plan Discussed with: CRNA  Anesthesia Plan Comments: (Lab Results      Component                Value               Date                      WBC                      5.7                 12/11/2020                HGB                      13.7                 12/11/2020                HCT                      39.5                12/11/2020                MCV                      91.9                12/11/2020                PLT                      291  12/11/2020           Lab Results      Component                Value               Date                      NA                       133 (L)             12/11/2020                K                        4.2                 12/11/2020                CO2                      25                  12/11/2020                GLUCOSE                  78                  12/11/2020                BUN                      8                   12/11/2020                CREATININE               0.63                12/11/2020                CALCIUM                  9.7                 12/11/2020                GFRNONAA                 >60                 12/11/2020                GFRAA                    >60                 11/27/2018           ECHO 03/22: There is severe prolapse of the entire posterior mitral valve leaflet  with severe eccentric mitral regurgitation. There is no flail chord. 2D  PISA radius 2.6 cm, 2D ERO (angle correction 57) 1.02 cm2, R vol 153 cc.  By continuity R vol 114 cc, RF 64%.  There is systolic flow reversal in 3/4  pulmonary veins. Overall, severe  mitral regurgitation is present. LVEF is 60%. The mitral valve is  myxomatous. Severe mitral valve regurgitation. No evidence of mitral  stenosis.  2. Left ventricular ejection fraction, by estimation, is 60 to 65%. Left  ventricular ejection fraction by 3D volume is 60 %. The left ventricle has  normal function. The left ventricle has no regional wall motion  abnormalities.  3. Right ventricular systolic function is normal. The right ventricular  size is normal.  4. Left atrial size was mild to moderately dilated. No left atrial/left  atrial appendage thrombus was detected. The LAA emptying velocity was 51   cm/s.  5. The aortic valve is tricuspid. Aortic valve regurgitation is not  visualized. No aortic stenosis is present.  6. Agitated saline contrast bubble study was negative, with no evidence  of any interatrial shunt.  CT coronaries 10/06/20: Coronary calcium score of 0. Normal coronary origins. Right dominance.  2. Normal coronary arteries.  3. Myxomatous mitral valve. Severe prolapse of the entire posterior mitral valve leaflet. No apparent flail segment. Mild posterior mitral annular calcification.  4. Small PFO.  5. Normal LV function, LVEF=71%. No regional wall motion abnormalities.)       Anesthesia Quick Evaluation

## 2020-12-14 NOTE — Progress Notes (Signed)
[address]       [name] [address]             [phone]     CARDIOTHORACIC SURGERY OFFICE NOTE  Referring Provider is O'Neal, Ronnald Ramp, MD PCP is Eartha Inch, MD   HPI:  Patient is a 55 year old male with history of mitral valve prolapse,mitral regurgitation and rheumatoid arthritis who returns the office today for follow-up of mitral regurgitation with tentative plans to proceed with elective mitral valve repair tomorrow.  He was last seen here in our office on November 02, 2020.  He reports no new problems or complaints over the past 6 weeks.  He is accompanied by his daughter for his office consultation visit today.   Current Outpatient Medications  Medication Sig Dispense Refill  . folic acid (FOLVITE) 1 MG tablet Take 2 mg by mouth daily.    . methotrexate (RHEUMATREX) 2.5 MG tablet Take 15 mg by mouth every Tuesday. Caution:Chemotherapy. Protect from light.    . Multiple Vitamin (MULTIVITAMIN WITH MINERALS) TABS tablet Take 1 tablet by mouth daily.     No current facility-administered medications for this visit.      Physical Exam:   BP 136/83 (BP Location: Right Arm, Patient Position: Sitting, Cuff Size: Normal)   Pulse 83   Temp 98.1 F (36.7 C) (Skin)   Resp 20   Wt 170 lb (77.1 kg)   SpO2 94% Comment: RA  BMI 25.10 kg/m   General:  Well-appearing  Chest:   Clear to auscultation  CV:   Regular rate and rhythm with holosystolic murmur  Incisions:  n/a  Abdomen:  Soft nontender  Extremities:  Warm and well-perfused  Diagnostic Tests:  CHEST - 2 VIEW  COMPARISON:  10/06/2020  FINDINGS: Cardiac shadow is within normal limits. The lungs are well aerated bilaterally. No focal infiltrate or effusion is seen. Mild scoliosis of the thoracic spine is noted concave to the left. No other focal abnormality is noted.  IMPRESSION: No active cardiopulmonary disease.   Electronically Signed   By: Alcide Clever M.D.    On: 12/13/2020 10:08   Impression:  Patient has mitral valve prolapse with stage C1 severe asymptomatic primary mitral regurgitation.  I have personally reviewed the patient's recent transesophageal echocardiogram, coronary CT angiogram, and CT angiogram of the chest, abdomen, and pelvis.  TEE confirmed the presence of myxomatous degenerative disease with a very large prolapsing segment involving the majority of the middle scallop of the posterior leaflet causing severe mitral regurgitation.  There is no significant calcification and no other complicating features.  Left ventricular function appears normal.  Coronary CT angiogram reveals no evidence for coronary artery disease and normal coronary artery anatomy.  Based upon review of the patient's TEE I feel there is greater than 98% likelihood of successful and durable long-term repair with anticipated risk of operative mortality less than 1%.  CT angiogram of the chest, abdomen, and pelvis reveal no contraindication to peripheral cannulation for surgery.  Plan:  The patient and his daughter were again counseled regarding diagnosis of severe primary mitral regurgitation.  We went on to discuss the indications, risks and potential benefits of mitral valve repair including a comparison between surgery and continued medical therapy with close follow-up.  The likelihood of successful and durable mitral valve repair has been discussed with particular reference to the findings of the most recent echocardiogram.  Based upon these findings and previous experience, I have quoted  a greater than 98 percent likelihood of successful valve repair with less than 1 percent risk of mortality or major morbidity.  Alternative surgical approaches have been discussed including a comparison between conventional sternotomy and minimally-invasive techniques.  The relative risks and benefits of each have been reviewed as they pertain to the patient's specific circumstances, and  expectations for the patient's postoperative convalescence has been discussed.    The patient understands and accepts all potential risks of surgery including but not limited to risk of death, stroke or other neurologic complication, myocardial infarction, congestive heart failure, respiratory failure, renal failure, bleeding requiring transfusion and/or reexploration, arrhythmia, heart block or bradycardia requiring permanent pacemaker insertion, infection or other wound complications, pneumonia, pleural and/or pericardial effusion, pulmonary embolus, aortic dissection or other major vascular complication, or other immediate or delayed complications related to valve repair or replacement including but not limited to recurrent or persistent mitral regurgitation and/or mitral stenosis, LV outflow tract obstruction, aortic insufficiency, paravalvular leak, posterior AV groove disruption, structural valve deterioration and failure, thrombosis, embolization, or endocarditis.  Specific risks potentially related to the minimally-invasive approach were discussed at length, including but not limited to risk of conversion to full or partial sternotomy, aortic dissection or other major vascular complication, unilateral acute lung injury or pulmonary edema, phrenic nerve dysfunction or paralysis, rib fracture, chronic pain, lung hernia, or lymphocele. All of his questions have been answered.    I spent in excess of 15 minutes during the conduct of this office consultation and >50% of this time involved direct face-to-face encounter with the patient for counseling and/or coordination of their care.    Salvatore Decent. Cornelius Moras, MD 12/14/2020 10:08 AM

## 2020-12-15 ENCOUNTER — Encounter (HOSPITAL_COMMUNITY): Payer: Self-pay | Admitting: Thoracic Surgery (Cardiothoracic Vascular Surgery)

## 2020-12-15 ENCOUNTER — Inpatient Hospital Stay (HOSPITAL_COMMUNITY): Payer: BC Managed Care – PPO

## 2020-12-15 ENCOUNTER — Inpatient Hospital Stay (HOSPITAL_COMMUNITY): Payer: BC Managed Care – PPO | Admitting: Vascular Surgery

## 2020-12-15 ENCOUNTER — Encounter (HOSPITAL_COMMUNITY)
Admission: RE | Disposition: A | Discharge status: Home / Self Care | Payer: Self-pay | Source: Home / Self Care | Attending: Thoracic Surgery (Cardiothoracic Vascular Surgery)

## 2020-12-15 ENCOUNTER — Inpatient Hospital Stay (HOSPITAL_COMMUNITY): Payer: BC Managed Care – PPO | Admitting: Certified Registered Nurse Anesthetist

## 2020-12-15 ENCOUNTER — Other Ambulatory Visit: Payer: Self-pay

## 2020-12-15 ENCOUNTER — Inpatient Hospital Stay (HOSPITAL_COMMUNITY)
Admission: RE | Admit: 2020-12-15 | Discharge: 2020-12-19 | DRG: 221 | Disposition: A | Discharge status: Home / Self Care | Payer: BC Managed Care – PPO | Attending: Thoracic Surgery (Cardiothoracic Vascular Surgery) | Admitting: Thoracic Surgery (Cardiothoracic Vascular Surgery)

## 2020-12-15 DIAGNOSIS — M069 Rheumatoid arthritis, unspecified: Secondary | ICD-10-CM | POA: Diagnosis present

## 2020-12-15 DIAGNOSIS — I340 Nonrheumatic mitral (valve) insufficiency: Secondary | ICD-10-CM | POA: Diagnosis not present

## 2020-12-15 DIAGNOSIS — J939 Pneumothorax, unspecified: Secondary | ICD-10-CM | POA: Diagnosis not present

## 2020-12-15 DIAGNOSIS — R030 Elevated blood-pressure reading, without diagnosis of hypertension: Secondary | ICD-10-CM | POA: Diagnosis not present

## 2020-12-15 DIAGNOSIS — Z954 Presence of other heart-valve replacement: Secondary | ICD-10-CM | POA: Diagnosis not present

## 2020-12-15 DIAGNOSIS — G4700 Insomnia, unspecified: Secondary | ICD-10-CM | POA: Diagnosis not present

## 2020-12-15 DIAGNOSIS — J811 Chronic pulmonary edema: Secondary | ICD-10-CM | POA: Diagnosis not present

## 2020-12-15 DIAGNOSIS — D62 Acute posthemorrhagic anemia: Secondary | ICD-10-CM | POA: Diagnosis not present

## 2020-12-15 DIAGNOSIS — Z87891 Personal history of nicotine dependence: Secondary | ICD-10-CM

## 2020-12-15 DIAGNOSIS — J9811 Atelectasis: Secondary | ICD-10-CM | POA: Diagnosis not present

## 2020-12-15 DIAGNOSIS — I088 Other rheumatic multiple valve diseases: Secondary | ICD-10-CM | POA: Diagnosis not present

## 2020-12-15 DIAGNOSIS — Z9889 Other specified postprocedural states: Secondary | ICD-10-CM

## 2020-12-15 DIAGNOSIS — R079 Chest pain, unspecified: Secondary | ICD-10-CM | POA: Diagnosis not present

## 2020-12-15 DIAGNOSIS — I341 Nonrheumatic mitral (valve) prolapse: Secondary | ICD-10-CM | POA: Diagnosis not present

## 2020-12-15 DIAGNOSIS — Z79899 Other long term (current) drug therapy: Secondary | ICD-10-CM

## 2020-12-15 DIAGNOSIS — I517 Cardiomegaly: Secondary | ICD-10-CM | POA: Diagnosis not present

## 2020-12-15 DIAGNOSIS — Z4682 Encounter for fitting and adjustment of non-vascular catheter: Secondary | ICD-10-CM

## 2020-12-15 DIAGNOSIS — J90 Pleural effusion, not elsewhere classified: Secondary | ICD-10-CM | POA: Diagnosis not present

## 2020-12-15 HISTORY — PX: MITRAL VALVE REPAIR: SHX2039

## 2020-12-15 HISTORY — PX: TEE WITHOUT CARDIOVERSION: SHX5443

## 2020-12-15 HISTORY — DX: Other specified postprocedural states: Z98.890

## 2020-12-15 LAB — POCT I-STAT EG7
Acid-Base Excess: 2 mmol/L (ref 0.0–2.0)
Bicarbonate: 27 mmol/L (ref 20.0–28.0)
Calcium, Ion: 1.06 mmol/L — ABNORMAL LOW (ref 1.15–1.40)
HCT: 30 % — ABNORMAL LOW (ref 39.0–52.0)
Hemoglobin: 10.2 g/dL — ABNORMAL LOW (ref 13.0–17.0)
O2 Saturation: 82 %
Potassium: 4.5 mmol/L (ref 3.5–5.1)
Sodium: 138 mmol/L (ref 135–145)
TCO2: 28 mmol/L (ref 22–32)
pCO2, Ven: 45.5 mmHg (ref 44.0–60.0)
pH, Ven: 7.382 (ref 7.250–7.430)
pO2, Ven: 47 mmHg — ABNORMAL HIGH (ref 32.0–45.0)

## 2020-12-15 LAB — POCT I-STAT, CHEM 8
BUN: 9 mg/dL (ref 6–20)
BUN: 9 mg/dL (ref 6–20)
BUN: 8 mg/dL (ref 6–20)
BUN: 7 mg/dL (ref 6–20)
BUN: 8 mg/dL (ref 6–20)
Calcium, Ion: 1.3 mmol/L (ref 1.15–1.40)
Calcium, Ion: 1.28 mmol/L (ref 1.15–1.40)
Calcium, Ion: 1.1 mmol/L — ABNORMAL LOW (ref 1.15–1.40)
Calcium, Ion: 1.08 mmol/L — ABNORMAL LOW (ref 1.15–1.40)
Calcium, Ion: 1.09 mmol/L — ABNORMAL LOW (ref 1.15–1.40)
Chloride: 100 mmol/L (ref 98–111)
Chloride: 101 mmol/L (ref 98–111)
Chloride: 101 mmol/L (ref 98–111)
Chloride: 101 mmol/L (ref 98–111)
Chloride: 102 mmol/L (ref 98–111)
Creatinine, Ser: 0.5 mg/dL — ABNORMAL LOW (ref 0.61–1.24)
Creatinine, Ser: 0.5 mg/dL — ABNORMAL LOW (ref 0.61–1.24)
Creatinine, Ser: 0.4 mg/dL — ABNORMAL LOW (ref 0.61–1.24)
Creatinine, Ser: 0.4 mg/dL — ABNORMAL LOW (ref 0.61–1.24)
Creatinine, Ser: 0.5 mg/dL — ABNORMAL LOW (ref 0.61–1.24)
Glucose, Bld: 98 mg/dL (ref 70–99)
Glucose, Bld: 102 mg/dL — ABNORMAL HIGH (ref 70–99)
Glucose, Bld: 116 mg/dL — ABNORMAL HIGH (ref 70–99)
Glucose, Bld: 126 mg/dL — ABNORMAL HIGH (ref 70–99)
Glucose, Bld: 115 mg/dL — ABNORMAL HIGH (ref 70–99)
HCT: 35 % — ABNORMAL LOW (ref 39.0–52.0)
HCT: 33 % — ABNORMAL LOW (ref 39.0–52.0)
HCT: 29 % — ABNORMAL LOW (ref 39.0–52.0)
HCT: 26 % — ABNORMAL LOW (ref 39.0–52.0)
HCT: 25 % — ABNORMAL LOW (ref 39.0–52.0)
Hemoglobin: 11.9 g/dL — ABNORMAL LOW (ref 13.0–17.0)
Hemoglobin: 11.2 g/dL — ABNORMAL LOW (ref 13.0–17.0)
Hemoglobin: 9.9 g/dL — ABNORMAL LOW (ref 13.0–17.0)
Hemoglobin: 8.8 g/dL — ABNORMAL LOW (ref 13.0–17.0)
Hemoglobin: 8.5 g/dL — ABNORMAL LOW (ref 13.0–17.0)
Potassium: 4.1 mmol/L (ref 3.5–5.1)
Potassium: 4.2 mmol/L (ref 3.5–5.1)
Potassium: 4.7 mmol/L (ref 3.5–5.1)
Potassium: 4.8 mmol/L (ref 3.5–5.1)
Potassium: 4.3 mmol/L (ref 3.5–5.1)
Sodium: 136 mmol/L (ref 135–145)
Sodium: 135 mmol/L (ref 135–145)
Sodium: 135 mmol/L (ref 135–145)
Sodium: 135 mmol/L (ref 135–145)
Sodium: 136 mmol/L (ref 135–145)
TCO2: 29 mmol/L (ref 22–32)
TCO2: 26 mmol/L (ref 22–32)
TCO2: 27 mmol/L (ref 22–32)
TCO2: 25 mmol/L (ref 22–32)
TCO2: 24 mmol/L (ref 22–32)

## 2020-12-15 LAB — POCT I-STAT 7, (LYTES, BLD GAS, ICA,H+H)
Acid-Base Excess: 3 mmol/L — ABNORMAL HIGH (ref 0.0–2.0)
Acid-Base Excess: 2 mmol/L (ref 0.0–2.0)
Acid-base deficit: 1 mmol/L (ref 0.0–2.0)
Acid-base deficit: 2 mmol/L (ref 0.0–2.0)
Acid-base deficit: 2 mmol/L (ref 0.0–2.0)
Bicarbonate: 29.3 mmol/L — ABNORMAL HIGH (ref 20.0–28.0)
Bicarbonate: 24.6 mmol/L (ref 20.0–28.0)
Bicarbonate: 26.3 mmol/L (ref 20.0–28.0)
Bicarbonate: 24.4 mmol/L (ref 20.0–28.0)
Bicarbonate: 24.7 mmol/L (ref 20.0–28.0)
Calcium, Ion: 1.26 mmol/L (ref 1.15–1.40)
Calcium, Ion: 1.08 mmol/L — ABNORMAL LOW (ref 1.15–1.40)
Calcium, Ion: 1.06 mmol/L — ABNORMAL LOW (ref 1.15–1.40)
Calcium, Ion: 1.06 mmol/L — ABNORMAL LOW (ref 1.15–1.40)
Calcium, Ion: 1.11 mmol/L — ABNORMAL LOW (ref 1.15–1.40)
HCT: 37 % — ABNORMAL LOW (ref 39.0–52.0)
HCT: 31 % — ABNORMAL LOW (ref 39.0–52.0)
HCT: 27 % — ABNORMAL LOW (ref 39.0–52.0)
HCT: 27 % — ABNORMAL LOW (ref 39.0–52.0)
HCT: 34 % — ABNORMAL LOW (ref 39.0–52.0)
Hemoglobin: 12.6 g/dL — ABNORMAL LOW (ref 13.0–17.0)
Hemoglobin: 10.5 g/dL — ABNORMAL LOW (ref 13.0–17.0)
Hemoglobin: 9.2 g/dL — ABNORMAL LOW (ref 13.0–17.0)
Hemoglobin: 9.2 g/dL — ABNORMAL LOW (ref 13.0–17.0)
Hemoglobin: 11.6 g/dL — ABNORMAL LOW (ref 13.0–17.0)
O2 Saturation: 100 %
O2 Saturation: 100 %
O2 Saturation: 100 %
O2 Saturation: 100 %
O2 Saturation: 99 %
Patient temperature: 36.5
Potassium: 4 mmol/L (ref 3.5–5.1)
Potassium: 4.5 mmol/L (ref 3.5–5.1)
Potassium: 4.6 mmol/L (ref 3.5–5.1)
Potassium: 4.3 mmol/L (ref 3.5–5.1)
Potassium: 4 mmol/L (ref 3.5–5.1)
Sodium: 136 mmol/L (ref 135–145)
Sodium: 136 mmol/L (ref 135–145)
Sodium: 136 mmol/L (ref 135–145)
Sodium: 138 mmol/L (ref 135–145)
Sodium: 137 mmol/L (ref 135–145)
TCO2: 31 mmol/L (ref 22–32)
TCO2: 26 mmol/L (ref 22–32)
TCO2: 28 mmol/L (ref 22–32)
TCO2: 26 mmol/L (ref 22–32)
TCO2: 26 mmol/L (ref 22–32)
pCO2 arterial: 51.9 mmHg — ABNORMAL HIGH (ref 32.0–48.0)
pCO2 arterial: 42.1 mmHg (ref 32.0–48.0)
pCO2 arterial: 41.6 mmHg (ref 32.0–48.0)
pCO2 arterial: 48.3 mmHg — ABNORMAL HIGH (ref 32.0–48.0)
pCO2 arterial: 51.6 mmHg — ABNORMAL HIGH (ref 32.0–48.0)
pH, Arterial: 7.359 (ref 7.350–7.450)
pH, Arterial: 7.376 (ref 7.350–7.450)
pH, Arterial: 7.409 (ref 7.350–7.450)
pH, Arterial: 7.311 — ABNORMAL LOW (ref 7.350–7.450)
pH, Arterial: 7.287 — ABNORMAL LOW (ref 7.350–7.450)
pO2, Arterial: 622 mmHg — ABNORMAL HIGH (ref 83.0–108.0)
pO2, Arterial: 364 mmHg — ABNORMAL HIGH (ref 83.0–108.0)
pO2, Arterial: 423 mmHg — ABNORMAL HIGH (ref 83.0–108.0)
pO2, Arterial: 220 mmHg — ABNORMAL HIGH (ref 83.0–108.0)
pO2, Arterial: 158 mmHg — ABNORMAL HIGH (ref 83.0–108.0)

## 2020-12-15 LAB — HEMOGLOBIN AND HEMATOCRIT, BLOOD
HCT: 28.2 % — ABNORMAL LOW (ref 39.0–52.0)
Hemoglobin: 9.9 g/dL — ABNORMAL LOW (ref 13.0–17.0)

## 2020-12-15 LAB — CBC
HCT: 33.8 % — ABNORMAL LOW (ref 39.0–52.0)
HCT: 33.1 % — ABNORMAL LOW (ref 39.0–52.0)
Hemoglobin: 12 g/dL — ABNORMAL LOW (ref 13.0–17.0)
Hemoglobin: 11.4 g/dL — ABNORMAL LOW (ref 13.0–17.0)
MCH: 32.8 pg (ref 26.0–34.0)
MCH: 31.8 pg (ref 26.0–34.0)
MCHC: 35.5 g/dL (ref 30.0–36.0)
MCHC: 34.4 g/dL (ref 30.0–36.0)
MCV: 92.3 fL (ref 80.0–100.0)
MCV: 92.2 fL (ref 80.0–100.0)
Platelets: 191 10*3/uL (ref 150–400)
Platelets: 179 10*3/uL (ref 150–400)
RBC: 3.66 MIL/uL — ABNORMAL LOW (ref 4.22–5.81)
RBC: 3.59 MIL/uL — ABNORMAL LOW (ref 4.22–5.81)
RDW: 13.2 % (ref 11.5–15.5)
RDW: 13.1 % (ref 11.5–15.5)
WBC: 15.4 10*3/uL — ABNORMAL HIGH (ref 4.0–10.5)
WBC: 11.9 10*3/uL — ABNORMAL HIGH (ref 4.0–10.5)
nRBC: 0 % (ref 0.0–0.2)
nRBC: 0 % (ref 0.0–0.2)

## 2020-12-15 LAB — ECHO INTRAOPERATIVE TEE
AR max vel: 21.12 cm2
AV Area VTI: 2.11 cm2
AV Area mean vel: 20.16 cm2
AV Mean grad: 4 mmHg
AV Peak grad: 6 mmHg
Ao pk vel: 1.22 m/s
Area-P 1/2: 4.71 cm2
Height: 69 in
MV M vel: 6.48 m/s
MV Peak grad: 168 mmHg
Radius: 2 cm
S' Lateral: 4.12 cm
Weight: 2719.59 oz

## 2020-12-15 LAB — BASIC METABOLIC PANEL
Anion gap: 3 — ABNORMAL LOW (ref 5–15)
BUN: 6 mg/dL (ref 6–20)
CO2: 22 mmol/L (ref 22–32)
Calcium: 7.2 mg/dL — ABNORMAL LOW (ref 8.9–10.3)
Chloride: 105 mmol/L (ref 98–111)
Creatinine, Ser: 0.5 mg/dL — ABNORMAL LOW (ref 0.61–1.24)
GFR, Estimated: >60 mL/min (ref >60)
Glucose, Bld: 108 mg/dL — ABNORMAL HIGH (ref 70–99)
Potassium: 3.8 mmol/L (ref 3.5–5.1)
Sodium: 130 mmol/L — ABNORMAL LOW (ref 135–145)

## 2020-12-15 LAB — ABO/RH: ABO/RH(D): O POS

## 2020-12-15 LAB — PROTIME-INR
INR: 1.3 — ABNORMAL HIGH (ref 0.8–1.2)
Prothrombin Time: 16.4 seconds — ABNORMAL HIGH (ref 11.4–15.2)

## 2020-12-15 LAB — MAGNESIUM: Magnesium: 2.8 mg/dL — ABNORMAL HIGH (ref 1.7–2.4)

## 2020-12-15 LAB — APTT: aPTT: 36 seconds (ref 24–36)

## 2020-12-15 LAB — PLATELET COUNT: Platelets: 188 10*3/uL (ref 150–400)

## 2020-12-15 SURGERY — REPAIR, MITRAL VALVE, MINIMALLY INVASIVE
Anesthesia: General | Site: Chest | Laterality: Right

## 2020-12-15 MED ORDER — LACTATED RINGERS IV SOLN: INTRAVENOUS | Status: DC

## 2020-12-15 MED ORDER — PROPOFOL 10 MG/ML IV BOLUS
INTRAVENOUS | Status: AC
  Filled 2020-12-15: qty 20

## 2020-12-15 MED ORDER — SODIUM CHLORIDE 0.9 % IV SOLN: 250.0000 mL | INTRAVENOUS | Status: DC

## 2020-12-15 MED ORDER — KETOROLAC TROMETHAMINE 15 MG/ML IJ SOLN
15.0000 mg | Freq: Four times a day (QID) | INTRAMUSCULAR | Status: AC
  Administered 2020-12-15 – 2020-12-16 (×5): 15 mg via INTRAVENOUS
  Filled 2020-12-15 (×5): qty 1

## 2020-12-15 MED ORDER — SODIUM CHLORIDE 0.45 % IV SOLN: INTRAVENOUS | Status: DC | PRN

## 2020-12-15 MED ORDER — FOLIC ACID 1 MG PO TABS
2.0000 mg | ORAL_TABLET | Freq: Every day | ORAL | Status: DC
  Administered 2020-12-16: 2 mg via ORAL
  Filled 2020-12-15: qty 2

## 2020-12-15 MED ORDER — DEXTROSE 50 % IV SOLN: 0.0000 mL | INTRAVENOUS | Status: DC | PRN

## 2020-12-15 MED ORDER — ACETAMINOPHEN 500 MG PO TABS
1000.0000 mg | ORAL_TABLET | Freq: Four times a day (QID) | ORAL | Status: DC
  Administered 2020-12-15 – 2020-12-19 (×13): 1000 mg via ORAL
  Filled 2020-12-15 (×13): qty 2

## 2020-12-15 MED ORDER — MAGNESIUM SULFATE 4 GM/100ML IV SOLN
4.0000 g | Freq: Once | INTRAVENOUS | Status: AC
  Administered 2020-12-15: 4 g via INTRAVENOUS
  Filled 2020-12-15: qty 100

## 2020-12-15 MED ORDER — MIDAZOLAM HCL 5 MG/5ML IJ SOLN
INTRAMUSCULAR | Status: DC | PRN
  Administered 2020-12-15 (×6): 1 mg via INTRAVENOUS

## 2020-12-15 MED ORDER — METOCLOPRAMIDE HCL 5 MG/ML IJ SOLN
10.0000 mg | Freq: Four times a day (QID) | INTRAMUSCULAR | Status: AC
  Administered 2020-12-15 – 2020-12-16 (×3): 10 mg via INTRAVENOUS
  Filled 2020-12-15 (×3): qty 2

## 2020-12-15 MED ORDER — CHLORHEXIDINE GLUCONATE 4 % EX LIQD: 30.0000 mL | CUTANEOUS | Status: DC

## 2020-12-15 MED ORDER — ACETAMINOPHEN 160 MG/5ML PO SOLN: 650.0000 mg | Freq: Once | ORAL | Status: AC

## 2020-12-15 MED ORDER — VANCOMYCIN HCL 1000 MG IV SOLR
INTRAVENOUS | Status: DC | PRN
  Administered 2020-12-15: 1000 mL

## 2020-12-15 MED ORDER — TRAMADOL HCL 50 MG PO TABS
50.0000 mg | ORAL_TABLET | ORAL | Status: DC | PRN
  Administered 2020-12-15 – 2020-12-17 (×6): 100 mg via ORAL
  Filled 2020-12-15 (×6): qty 2

## 2020-12-15 MED ORDER — VANCOMYCIN HCL IN DEXTROSE 1-5 GM/200ML-% IV SOLN
1000.0000 mg | Freq: Once | INTRAVENOUS | Status: AC
  Administered 2020-12-15: 1000 mg via INTRAVENOUS
  Filled 2020-12-15: qty 200

## 2020-12-15 MED ORDER — EPHEDRINE SULFATE-NACL 50-0.9 MG/10ML-% IV SOSY
PREFILLED_SYRINGE | INTRAVENOUS | Status: DC | PRN
  Administered 2020-12-15: 5 mg via INTRAVENOUS

## 2020-12-15 MED ORDER — ROCURONIUM BROMIDE 10 MG/ML (PF) SYRINGE
PREFILLED_SYRINGE | INTRAVENOUS | Status: DC | PRN
  Administered 2020-12-15: 50 mg via INTRAVENOUS
  Administered 2020-12-15: 60 mg via INTRAVENOUS
  Administered 2020-12-15: 40 mg via INTRAVENOUS
  Administered 2020-12-15: 20 mg via INTRAVENOUS

## 2020-12-15 MED ORDER — EPHEDRINE 5 MG/ML INJ
INTRAVENOUS | Status: AC
  Filled 2020-12-15: qty 10

## 2020-12-15 MED ORDER — LACTATED RINGERS IV SOLN: INTRAVENOUS | Status: DC | PRN

## 2020-12-15 MED ORDER — FENTANYL CITRATE (PF) 250 MCG/5ML IJ SOLN
INTRAMUSCULAR | Status: AC
  Filled 2020-12-15: qty 5

## 2020-12-15 MED ORDER — INSULIN REGULAR(HUMAN) IN NACL 100-0.9 UT/100ML-% IV SOLN
INTRAVENOUS | Status: DC
  Filled 2020-12-15: qty 100

## 2020-12-15 MED ORDER — LACTATED RINGERS IV SOLN: 500.0000 mL | Freq: Once | INTRAVENOUS | Status: DC | PRN

## 2020-12-15 MED ORDER — CHLORHEXIDINE GLUCONATE 0.12 % MT SOLN
15.0000 mL | Freq: Once | OROMUCOSAL | Status: AC
  Administered 2020-12-15: 15 mL via OROMUCOSAL
  Filled 2020-12-15: qty 15

## 2020-12-15 MED ORDER — MIDAZOLAM HCL (PF) 10 MG/2ML IJ SOLN
INTRAMUSCULAR | Status: AC
  Filled 2020-12-15: qty 2

## 2020-12-15 MED ORDER — MIDAZOLAM HCL 2 MG/2ML IJ SOLN: 2.0000 mg | INTRAMUSCULAR | Status: DC | PRN

## 2020-12-15 MED ORDER — SODIUM CHLORIDE 0.9% FLUSH: 3.0000 mL | INTRAVENOUS | Status: DC | PRN

## 2020-12-15 MED ORDER — MORPHINE SULFATE (PF) 2 MG/ML IV SOLN
1.0000 mg | INTRAVENOUS | Status: DC | PRN
  Administered 2020-12-15: 4 mg via INTRAVENOUS
  Administered 2020-12-15 – 2020-12-17 (×3): 2 mg via INTRAVENOUS
  Filled 2020-12-15: qty 1
  Filled 2020-12-15: qty 2
  Filled 2020-12-15: qty 1
  Filled 2020-12-15: qty 2

## 2020-12-15 MED ORDER — CHLORHEXIDINE GLUCONATE 0.12 % MT SOLN
15.0000 mL | Freq: Two times a day (BID) | OROMUCOSAL | Status: DC
  Administered 2020-12-15 – 2020-12-19 (×7): 15 mL via OROMUCOSAL
  Filled 2020-12-15 (×5): qty 15

## 2020-12-15 MED ORDER — DEXMEDETOMIDINE HCL IN NACL 400 MCG/100ML IV SOLN
0.0000 ug/kg/h | INTRAVENOUS | Status: DC
  Filled 2020-12-15: qty 100

## 2020-12-15 MED ORDER — PANTOPRAZOLE SODIUM 40 MG PO TBEC
40.0000 mg | DELAYED_RELEASE_TABLET | Freq: Every day | ORAL | Status: DC
  Administered 2020-12-17 – 2020-12-19 (×3): 40 mg via ORAL
  Filled 2020-12-15 (×3): qty 1

## 2020-12-15 MED ORDER — SODIUM CHLORIDE 0.9% FLUSH
3.0000 mL | Freq: Two times a day (BID) | INTRAVENOUS | Status: DC
  Administered 2020-12-16 – 2020-12-19 (×4): 3 mL via INTRAVENOUS

## 2020-12-15 MED ORDER — INSULIN ASPART 100 UNIT/ML IJ SOLN: 0.0000 [IU] | INTRAMUSCULAR | Status: DC

## 2020-12-15 MED ORDER — ASPIRIN 81 MG PO CHEW
324.0000 mg | CHEWABLE_TABLET | Freq: Every day | ORAL | Status: DC
  Filled 2020-12-15: qty 4

## 2020-12-15 MED ORDER — CEFAZOLIN SODIUM-DEXTROSE 2-4 GM/100ML-% IV SOLN
2.0000 g | Freq: Three times a day (TID) | INTRAVENOUS | Status: AC
  Administered 2020-12-15 – 2020-12-17 (×6): 2 g via INTRAVENOUS
  Filled 2020-12-15 (×7): qty 100

## 2020-12-15 MED ORDER — PAPAVERINE HCL 30 MG/ML IJ SOLN: INTRAMUSCULAR | Status: DC | PRN

## 2020-12-15 MED ORDER — HEPARIN SODIUM (PORCINE) 1000 UNIT/ML IJ SOLN
INTRAMUSCULAR | Status: AC
  Filled 2020-12-15: qty 1

## 2020-12-15 MED ORDER — BUPIVACAINE LIPOSOME 1.3 % IJ SUSP
INTRAMUSCULAR | Status: AC
  Filled 2020-12-15: qty 20

## 2020-12-15 MED ORDER — FENTANYL CITRATE (PF) 250 MCG/5ML IJ SOLN
INTRAMUSCULAR | Status: DC | PRN
  Administered 2020-12-15 (×8): 50 ug via INTRAVENOUS

## 2020-12-15 MED ORDER — POTASSIUM CHLORIDE 10 MEQ/50ML IV SOLN: 10.0000 meq | INTRAVENOUS | Status: AC

## 2020-12-15 MED ORDER — HEPARIN SODIUM (PORCINE) 1000 UNIT/ML IJ SOLN
INTRAMUSCULAR | Status: DC | PRN
  Administered 2020-12-15: 27000 [IU] via INTRAVENOUS

## 2020-12-15 MED ORDER — BUPIVACAINE HCL (PF) 0.5 % IJ SOLN
INTRAMUSCULAR | Status: AC
  Filled 2020-12-15: qty 30

## 2020-12-15 MED ORDER — ONDANSETRON HCL 4 MG/2ML IJ SOLN
INTRAMUSCULAR | Status: AC
  Filled 2020-12-15: qty 2

## 2020-12-15 MED ORDER — CHLORHEXIDINE GLUCONATE 0.12 % MT SOLN
15.0000 mL | OROMUCOSAL | Status: AC
  Administered 2020-12-15: 15 mL via OROMUCOSAL

## 2020-12-15 MED ORDER — SODIUM CHLORIDE 0.9 % IV SOLN: INTRAVENOUS | Status: DC | PRN

## 2020-12-15 MED ORDER — CHLORHEXIDINE GLUCONATE CLOTH 2 % EX PADS
6.0000 | MEDICATED_PAD | Freq: Every day | CUTANEOUS | Status: DC
  Administered 2020-12-17 – 2020-12-19 (×3): 6 via TOPICAL

## 2020-12-15 MED ORDER — PHENYLEPHRINE HCL-NACL 20-0.9 MG/250ML-% IV SOLN: 0.0000 ug/min | INTRAVENOUS | Status: DC

## 2020-12-15 MED ORDER — BUPIVACAINE LIPOSOME 1.3 % IJ SUSP
INTRAMUSCULAR | Status: DC | PRN
  Administered 2020-12-15: 50 mL

## 2020-12-15 MED ORDER — ACETAMINOPHEN 650 MG RE SUPP
650.0000 mg | Freq: Once | RECTAL | Status: AC
  Administered 2020-12-15: 650 mg via RECTAL
  Filled 2020-12-15: qty 1

## 2020-12-15 MED ORDER — METOPROLOL TARTRATE 12.5 MG HALF TABLET
12.5000 mg | ORAL_TABLET | Freq: Once | ORAL | Status: AC
  Administered 2020-12-15: 12.5 mg via ORAL
  Filled 2020-12-15: qty 1

## 2020-12-15 MED ORDER — SODIUM CHLORIDE (PF) 0.9 % IJ SOLN
INTRAMUSCULAR | Status: AC
  Filled 2020-12-15: qty 10

## 2020-12-15 MED ORDER — ROCURONIUM BROMIDE 10 MG/ML (PF) SYRINGE
PREFILLED_SYRINGE | INTRAVENOUS | Status: AC
  Filled 2020-12-15: qty 10

## 2020-12-15 MED ORDER — FAMOTIDINE IN NACL 20-0.9 MG/50ML-% IV SOLN
20.0000 mg | Freq: Two times a day (BID) | INTRAVENOUS | Status: DC
  Administered 2020-12-15: 20 mg via INTRAVENOUS
  Filled 2020-12-15 (×2): qty 50

## 2020-12-15 MED ORDER — ORAL CARE MOUTH RINSE
15.0000 mL | Freq: Two times a day (BID) | OROMUCOSAL | Status: DC
  Administered 2020-12-16 – 2020-12-17 (×2): 15 mL via OROMUCOSAL

## 2020-12-15 MED ORDER — BISACODYL 5 MG PO TBEC
10.0000 mg | DELAYED_RELEASE_TABLET | Freq: Every day | ORAL | Status: DC
  Administered 2020-12-16 – 2020-12-18 (×3): 10 mg via ORAL
  Filled 2020-12-15 (×4): qty 2

## 2020-12-15 MED ORDER — SODIUM CHLORIDE 0.9 % IV SOLN: INTRAVENOUS | Status: AC

## 2020-12-15 MED ORDER — ALBUMIN HUMAN 5 % IV SOLN
250.0000 mL | INTRAVENOUS | Status: AC | PRN
Start: 2020-12-15 — End: 2020-12-16
  Administered 2020-12-16: 12.5 g via INTRAVENOUS
  Filled 2020-12-15: qty 250

## 2020-12-15 MED ORDER — OXYCODONE HCL 5 MG PO TABS
5.0000 mg | ORAL_TABLET | ORAL | Status: DC | PRN
  Administered 2020-12-16: 5 mg via ORAL
  Administered 2020-12-16: 10 mg via ORAL
  Administered 2020-12-16: 5 mg via ORAL
  Administered 2020-12-17 (×2): 10 mg via ORAL
  Administered 2020-12-17: 5 mg via ORAL
  Filled 2020-12-15: qty 1
  Filled 2020-12-15 (×3): qty 2
  Filled 2020-12-15 (×2): qty 1

## 2020-12-15 MED ORDER — NITROGLYCERIN IN D5W 200-5 MCG/ML-% IV SOLN: 0.0000 ug/min | INTRAVENOUS | Status: DC

## 2020-12-15 MED ORDER — SUGAMMADEX SODIUM 200 MG/2ML IV SOLN
INTRAVENOUS | Status: DC | PRN
  Administered 2020-12-15: 200 mg via INTRAVENOUS

## 2020-12-15 MED ORDER — PROPOFOL 10 MG/ML IV BOLUS
INTRAVENOUS | Status: DC | PRN
  Administered 2020-12-15: 130 mg via INTRAVENOUS

## 2020-12-15 MED ORDER — ASPIRIN EC 325 MG PO TBEC: 325.0000 mg | DELAYED_RELEASE_TABLET | Freq: Every day | ORAL | Status: DC

## 2020-12-15 MED ORDER — DOCUSATE SODIUM 100 MG PO CAPS
200.0000 mg | ORAL_CAPSULE | Freq: Every day | ORAL | Status: DC
  Administered 2020-12-16 – 2020-12-17 (×2): 200 mg via ORAL
  Filled 2020-12-15 (×3): qty 2

## 2020-12-15 MED ORDER — ONDANSETRON HCL 4 MG/2ML IJ SOLN
4.0000 mg | Freq: Four times a day (QID) | INTRAMUSCULAR | Status: DC | PRN
  Administered 2020-12-16 – 2020-12-17 (×5): 4 mg via INTRAVENOUS
  Filled 2020-12-15 (×5): qty 2

## 2020-12-15 MED ORDER — ONDANSETRON HCL 4 MG/2ML IJ SOLN
INTRAMUSCULAR | Status: DC | PRN
  Administered 2020-12-15: 4 mg via INTRAVENOUS

## 2020-12-15 MED ORDER — ACETAMINOPHEN 160 MG/5ML PO SOLN
1000.0000 mg | Freq: Four times a day (QID) | ORAL | Status: DC
  Filled 2020-12-15: qty 40.6

## 2020-12-15 MED ORDER — PROTAMINE SULFATE 10 MG/ML IV SOLN
INTRAVENOUS | Status: AC
  Filled 2020-12-15: qty 25

## 2020-12-15 MED ORDER — ADULT MULTIVITAMIN W/MINERALS CH
1.0000 | ORAL_TABLET | Freq: Every day | ORAL | Status: DC
  Administered 2020-12-16 – 2020-12-19 (×4): 1 via ORAL
  Filled 2020-12-15 (×4): qty 1

## 2020-12-15 MED ORDER — 0.9 % SODIUM CHLORIDE (POUR BTL) OPTIME
TOPICAL | Status: DC | PRN
  Administered 2020-12-15: 1000 mL

## 2020-12-15 MED ORDER — BISACODYL 10 MG RE SUPP
10.0000 mg | Freq: Every day | RECTAL | Status: DC
  Filled 2020-12-15: qty 1

## 2020-12-15 MED ORDER — PROTAMINE SULFATE 10 MG/ML IV SOLN
INTRAVENOUS | Status: DC | PRN
  Administered 2020-12-15: 270 mg via INTRAVENOUS

## 2020-12-15 MED ORDER — SODIUM CHLORIDE 0.9 % IV SOLN: INTRAVENOUS | Status: DC

## 2020-12-15 SURGICAL SUPPLY — 119 items
ADAPTER CARDIO PERF ANTE/RETRO (ADAPTER) ×3 IMPLANT
BAG DECANTER FOR FLEXI CONT (MISCELLANEOUS) ×6 IMPLANT
BLADE CLIPPER SURG (BLADE) ×3 IMPLANT
BLADE SURG 11 STRL SS (BLADE) ×3 IMPLANT
CABLE PACING FASLOC BIEGE (MISCELLANEOUS) ×3 IMPLANT
CANISTER SUCT 3000ML PPV (MISCELLANEOUS) ×6 IMPLANT
CANNULA ADULT BIO-MEDICUS 15FR (CANNULA) ×3 IMPLANT
CANNULA FEM VENOUS REMOTE 22FR (CANNULA) ×3 IMPLANT
CANNULA FEMORAL ART 14 SM (MISCELLANEOUS) ×3 IMPLANT
CANNULA GUNDRY RCSP 15FR (MISCELLANEOUS) ×3 IMPLANT
CANNULA OPTISITE PERFUSION 16F (CANNULA) IMPLANT
CANNULA OPTISITE PERFUSION 18F (CANNULA) ×3 IMPLANT
CANNULA SUMP PERICARDIAL (CANNULA) ×6 IMPLANT
CATH CPB KIT OWEN (MISCELLANEOUS) IMPLANT
CATH KIT ON-Q SILVERSOAK 5IN (CATHETERS) IMPLANT
CELLS DAT CNTRL 66122 CELL SVR (MISCELLANEOUS) ×2 IMPLANT
CLIP VESOCCLUDE SM WIDE 24/CT (CLIP) ×3 IMPLANT
CNTNR URN SCR LID CUP LEK RST (MISCELLANEOUS) ×2 IMPLANT
CONN ST 1/4X3/8  BEN (MISCELLANEOUS) ×2
CONN ST 1/4X3/8 BEN (MISCELLANEOUS) ×4 IMPLANT
CONNECTOR 1/2X3/8X1/2 3 WAY (MISCELLANEOUS) ×1
CONNECTOR 1/2X3/8X1/2 3WAY (MISCELLANEOUS) ×2 IMPLANT
CONT SPEC 4OZ STRL OR WHT (MISCELLANEOUS) ×1
CONTAINER PROTECT SURGISLUSH (MISCELLANEOUS) ×3 IMPLANT
COVER BACK TABLE 24X17X13 BIG (DRAPES) ×3 IMPLANT
COVER PROBE W GEL 5X96 (DRAPES) ×3 IMPLANT
DERMABOND ADVANCED (GAUZE/BANDAGES/DRESSINGS) ×2
DERMABOND ADVANCED .7 DNX12 (GAUZE/BANDAGES/DRESSINGS) ×4 IMPLANT
DEVICE CLOSURE PERCLS PRGLD 6F (VASCULAR PRODUCTS) ×8 IMPLANT
DEVICE SUT CK QUICK LOAD INDV (Prosthesis & Implant Heart) ×3 IMPLANT
DEVICE SUT CK QUICK LOAD MINI (Prosthesis & Implant Heart) ×3 IMPLANT
DEVICE TROCAR PUNCTURE CLOSURE (ENDOMECHANICALS) ×3 IMPLANT
DRAIN CHANNEL 32F RND 10.7 FF (WOUND CARE) ×6 IMPLANT
DRAPE C-ARM 42X72 X-RAY (DRAPES) ×3 IMPLANT
DRAPE CV SPLIT W-CLR ANES SCRN (DRAPES) ×3 IMPLANT
DRAPE INCISE IOBAN 66X45 STRL (DRAPES) ×6 IMPLANT
DRAPE PERI GROIN 82X75IN TIB (DRAPES) ×3 IMPLANT
DRAPE WARM FLUID 44X44 (DRAPES) ×3 IMPLANT
DRSG AQUACEL AG ADV 3.5X10 (GAUZE/BANDAGES/DRESSINGS) ×3 IMPLANT
ELECT BLADE 6.5 EXT (BLADE) ×3 IMPLANT
ELECT REM PT RETURN 9FT ADLT (ELECTROSURGICAL) ×6
ELECTRODE REM PT RTRN 9FT ADLT (ELECTROSURGICAL) ×4 IMPLANT
FELT TEFLON 1X6 (MISCELLANEOUS) ×3 IMPLANT
FEMORAL VENOUS CANN RAP (CANNULA) IMPLANT
GAUZE SPONGE 4X4 12PLY STRL LF (GAUZE/BANDAGES/DRESSINGS) ×3 IMPLANT
GLOVE ORTHO TXT STRL SZ7.5 (GLOVE) ×9 IMPLANT
GLOVE SURG ENC MOIS LTX SZ6 (GLOVE) ×6 IMPLANT
GLOVE SURG MICRO LTX SZ6 (GLOVE) ×6 IMPLANT
GLOVE SURG MICRO LTX SZ6.5 (GLOVE) ×12 IMPLANT
GLOVE SURG POLYISO LF SZ6.5 (GLOVE) ×6 IMPLANT
GLOVE SURG UNDER POLY LF SZ6.5 (GLOVE) ×15 IMPLANT
GOWN STRL REUS W/ TWL LRG LVL3 (GOWN DISPOSABLE) ×14 IMPLANT
GOWN STRL REUS W/TWL LRG LVL3 (GOWN DISPOSABLE) ×7
GRASPER SUT TROCAR 14GX15 (MISCELLANEOUS) ×3 IMPLANT
KIT BASIN OR (CUSTOM PROCEDURE TRAY) ×3 IMPLANT
KIT DILATOR VASC 18G NDL (KITS) ×3 IMPLANT
KIT DRAINAGE VACCUM ASSIST (KITS) ×3 IMPLANT
KIT SUCTION CATH 14FR (SUCTIONS) ×3 IMPLANT
KIT SUT CK MINI COMBO 4X17 (Prosthesis & Implant Heart) ×3 IMPLANT
KIT TURNOVER KIT B (KITS) ×3 IMPLANT
LEAD PACING MYOCARDI (MISCELLANEOUS) ×3 IMPLANT
LINE VENT (MISCELLANEOUS) ×3 IMPLANT
NEEDLE AORTIC ROOT 14G 7F (CATHETERS) ×3 IMPLANT
NS IRRIG 1000ML POUR BTL (IV SOLUTION) ×15 IMPLANT
PACK E MIN INVASIVE VALVE (SUTURE) IMPLANT
PACK OPEN HEART (CUSTOM PROCEDURE TRAY) ×3 IMPLANT
PAD ARMBOARD 7.5X6 YLW CONV (MISCELLANEOUS) ×6 IMPLANT
PAD ELECT DEFIB RADIOL ZOLL (MISCELLANEOUS) ×3 IMPLANT
PERCLOSE PROGLIDE 6F (VASCULAR PRODUCTS) ×12
POSITIONER HEAD DONUT 9IN (MISCELLANEOUS) ×3 IMPLANT
RING ANLPLS SIMUFORM 34 (Prosthesis & Implant Heart) ×2 IMPLANT
RING ANNULOPLASTY SIMUFORM 34 (Prosthesis & Implant Heart) ×1 IMPLANT
RTRCTR WOUND ALEXIS 18CM MED (MISCELLANEOUS) ×3
SET CANNULATION TOURNIQUET (MISCELLANEOUS) ×3 IMPLANT
SET IRRIG TUBING LAPAROSCOPIC (IRRIGATION / IRRIGATOR) ×3 IMPLANT
SET MICROPUNCTURE 5F STIFF (MISCELLANEOUS) ×3 IMPLANT
SET MPS 3-ND DEL (MISCELLANEOUS) ×3 IMPLANT
SHEATH PINNACLE 8F 10CM (SHEATH) ×9 IMPLANT
SOL ANTI FOG 6CC (MISCELLANEOUS) ×2 IMPLANT
SOLUTION ANTI FOG 6CC (MISCELLANEOUS) ×1
SUT BONE WAX W31G (SUTURE) ×3 IMPLANT
SUT EB EXC GRN/WHT 2-0 D/A SH (SUTURE) ×3
SUT ETHIBOND (SUTURE) ×6 IMPLANT
SUT ETHIBOND 2-0 RB-1 WHT (SUTURE) ×6 IMPLANT
SUT ETHIBOND NAB MH 2-0 36IN (SUTURE) ×3 IMPLANT
SUT ETHIBOND X763 2 0 SH 1 (SUTURE) ×6 IMPLANT
SUT MNCRL AB 3-0 PS2 18 (SUTURE) ×3 IMPLANT
SUT PROLENE 3 0 SH DA (SUTURE) ×12 IMPLANT
SUT PROLENE 3 0 SH1 36 (SUTURE) ×15 IMPLANT
SUT PROLENE 4 0 RB 1 (SUTURE) ×1
SUT PROLENE 4-0 RB1 .5 CRCL 36 (SUTURE) ×2 IMPLANT
SUT PROLENE 6 0 C 1 24 (SUTURE) ×3 IMPLANT
SUT PROLENE 6 0 C 1 30 (SUTURE) ×6 IMPLANT
SUT PTFE CHORD X 16MM (SUTURE) ×6 IMPLANT
SUT SILK  1 MH (SUTURE) ×8
SUT SILK 1 MH (SUTURE) ×16 IMPLANT
SUT SILK 1 TIES 10X30 (SUTURE) ×3 IMPLANT
SUT SILK 2 0 SH CR/8 (SUTURE) ×3 IMPLANT
SUT SILK 2 0 TIES 10X30 (SUTURE) ×3 IMPLANT
SUT SILK 2 0SH CR/8 30 (SUTURE) ×3 IMPLANT
SUT SILK 3 0 SH CR/8 (SUTURE) ×3 IMPLANT
SUT SILK 3 0 TIES 10X30 (SUTURE) ×3 IMPLANT
SUT SILK 3 0SH CR/8 30 (SUTURE) ×3 IMPLANT
SUT TEM PAC WIRE 2 0 SH (SUTURE) ×6 IMPLANT
SUT VIC AB 2 TP1 27 (SUTURE) ×3 IMPLANT
SUT VIC AB 2-0 CTX 36 (SUTURE) ×6 IMPLANT
SUT VIC AB 3-0 SH 8-18 (SUTURE) ×6 IMPLANT
SUTURE EB EXC GRN/WHT 2-0 D/A (SUTURE) ×2 IMPLANT
SYSTEM SAHARA CHEST DRAIN ATS (WOUND CARE) ×3 IMPLANT
TAPE PAPER 3X10 WHT MICROPORE (GAUZE/BANDAGES/DRESSINGS) ×3 IMPLANT
TOWEL GREEN STERILE (TOWEL DISPOSABLE) ×3 IMPLANT
TOWEL GREEN STERILE FF (TOWEL DISPOSABLE) ×3 IMPLANT
TRAY FOLEY SLVR 16FR TEMP STAT (SET/KITS/TRAYS/PACK) ×3 IMPLANT
TROCAR XCEL BLADELESS 5X75MML (TROCAR) ×3 IMPLANT
TROCAR XCEL NON-BLD 11X100MML (ENDOMECHANICALS) ×6 IMPLANT
TUBE SUCT INTRACARD DLP 20F (MISCELLANEOUS) ×6 IMPLANT
UNDERPAD 30X36 HEAVY ABSORB (UNDERPADS AND DIAPERS) ×3 IMPLANT
WATER STERILE IRR 1000ML POUR (IV SOLUTION) ×6 IMPLANT
WIRE EMERALD 3MM-J .035X150CM (WIRE) ×3 IMPLANT

## 2020-12-15 NOTE — Progress Notes (Signed)
Radiology called report of CXR and asked that I call  the report to Dr. Cornelius Moras. Dr. Cornelius Moras notified of CXR.  No new orders.  Per Dr. Cornelius Moras, CXR is ok and the CVC is ok to use.

## 2020-12-15 NOTE — Hospital Course (Addendum)
History of Present Illness:  Patient is a 55 year old male with history of mitral valve prolapse, mitral regurgitation and rheumatoid arthritis who has been referred for surgical consultation to discuss treatment options for management of severe primary mitral regurgitation.   Patient states that he was first told he had a heart murmur in 2017.  At the time he lived in [place] and echocardiogram performed there revealed mitral valve prolapse with what was felt to be moderate mitral regurgitation and normal left ventricular systolic function.  Shortly after that he moved to [facility] where he has established health care follow-up with Dr. Cyndia Bent and Dr. Lenon Curt.  Follow-up transthoracic echocardiograms have documented the presence of mitral valve prolapse with mitral regurgitation that has progressed in severity.  Most recent follow-up echocardiogram performed May 19, 2020 revealed severe prolapse involving the middle scallop of the posterior leaflet causing moderate to severe mitral regurgitation.  Left ventricular systolic function remain normal.  Patient was referred for elective surgical consultation.   Patient is single and lives alone locally in [place].  He is accompanied by his longtime partner who is also the mother of his daughter for his office consultation visit today.  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  He does not exercise at all on a regular basis but he reports no specific physical limitations.  Within the last few years he has been diagnosed with rheumatoid arthritis for which she has been taking methotrexate for the past year.  This is brought symptoms of arthritis under reasonably good control.  When the patient is not working he does enjoy working around his house and he recently built a Product manager.  He denies any history of symptoms of exertional shortness of breath or chest discomfort.  He has not had any resting  shortness of breath, PND, orthopnea, dizzy spells, or syncope.  He reports frequent palpitations that are self-limited.  He has some swelling involving his right lower leg in association with varicose veins.  He does not experience any lower extremity edema on the left side.   Patient was initially seen in consultation on September 28, 2020.  Since then he was evaluated by Dr. Bufford Buttner who performed transesophageal echocardiogram on October 26, 2020.  TEE confirmed the presence of mitral valve prolapse with a very large prolapsing segment involving the majority of the middle scallop (P2) of the posterior leaflet causing severe (4+) mitral regurgitation.  Regurgitant volume was calculated greater than 100 mL with regurgitant fraction 64% and systolic flow reversal in 3 out of 4 pulmonary veins.  Left ventricular systolic function remains normal with ejection fraction 60 to 65%.  There was moderate left atrial enlargement.  No other significant abnormalities were noted.  The patient underwent gated coronary CT angiogram which revealed normal coronary artery anatomy with no significant coronary artery disease and calcium score of 0.  Patient returns to the office today for follow-up to discuss treatment options further.  He continues to feel well and he specifically denies any symptoms of exertional shortness of breath.  He is eager to proceed with elective surgical repair in the near future.  Hospital Course:  Mr. [place] presented to [facility] 12/15/2020.  He was taken to the operating room and underwent Minimally Invasive Mitral Valve Repair with ring annuloplasty and placement of 10 Gore-tex Neochords.  He tolerated the procedure without difficulty, was extubated, and taken to the SICU in stable condition.  He was weaned  off Neo-synephrine as his blood pressure allowed.  On physical exam he was noted to have a murmur concerning for SAM.  He was initiated on beta blocker therapy for this.  Echocardiogram  was obtained and confirmed the presence of SAM which was mild.  He was initiated on coumadin for his MV Repair.  He was maintaining NSR and his pacing wires were removed without difficulty.  He was medically stable for transfer to the progressive care unit on 12/17/2020.  His chest tube output decreased and his chest tubes were removed on 12/18/2020.  Follow up CXR showed ***.  He remains on coumadin with most recent INR of ***.  He will be discharged home on *** mg of coumadin daily.  He is ambulating independently.  His surgical incisions are healing without evidence of infection.  He is medically stable for discharge home today.

## 2020-12-15 NOTE — Progress Notes (Signed)
Patient ID: Perry Lewis, male   DOB: 03-13-1966, 55 y.o.   MRN: [id number]  TCTS Evening Rounds:   Hemodynamically stable, atrial paced.  CI = 3  Extubated.  Urine output good  CT output low  CBC    Component Value Date/Time   WBC 15.4 (H) 12/15/2020 1451   RBC 3.66 (L) 12/15/2020 1451   HGB 12.0 (L) 12/15/2020 1451   HGB 14.0 10/21/2020 0000   HCT 33.8 (L) 12/15/2020 1451   HCT 42.5 10/21/2020 0000   PLT 191 12/15/2020 1451   PLT 285 10/21/2020 0000   MCV 92.3 12/15/2020 1451   MCV 95 10/21/2020 0000   MCH 32.8 12/15/2020 1451   MCHC 35.5 12/15/2020 1451   RDW 13.2 12/15/2020 1451   RDW 12.5 10/21/2020 0000   LYMPHSABS 1.4 11/27/2018 2043   MONOABS 0.8 11/27/2018 2043   EOSABS 0.0 11/27/2018 2043   BASOSABS 0.0 11/27/2018 2043     BMET    Component Value Date/Time   NA 138 12/15/2020 1247   NA 138 10/21/2020 0000   K 4.3 12/15/2020 1247   CL 102 12/15/2020 1243   CO2 25 12/11/2020 1122   GLUCOSE 115 (H) 12/15/2020 1243   BUN 8 12/15/2020 1243   BUN 13 10/21/2020 0000   CREATININE 0.50 (L) 12/15/2020 1243   CALCIUM 9.7 12/11/2020 1122   GFRNONAA >60 12/11/2020 1122   GFRAA >60 11/27/2018 2043     A/P:  Stable postop course. Continue current plans

## 2020-12-15 NOTE — Anesthesia Procedure Notes (Signed)
Procedure Name: Intubation Date/Time: 12/15/2020 8:09 AM Performed by: Reece Agar, CRNA Pre-anesthesia Checklist: Patient identified, Emergency Drugs available, Suction available and Patient being monitored Patient Re-evaluated:Patient Re-evaluated prior to induction Oxygen Delivery Method: Circle System Utilized Preoxygenation: Pre-oxygenation with 100% oxygen Induction Type: IV induction Ventilation: Mask ventilation without difficulty Laryngoscope Size: Mac and 3 Grade View: Grade I Tube type: Oral Endobronchial tube: Left and Double lumen EBT and 39 Fr Number of attempts: 1 Airway Equipment and Method: Stylet Placement Confirmation: ETT inserted through vocal cords under direct vision,  positive ETCO2 and breath sounds checked- equal and bilateral Secured at: 29 cm Tube secured with: Tape Dental Injury: Teeth and Oropharynx as per pre-operative assessment

## 2020-12-15 NOTE — Anesthesia Procedure Notes (Signed)
Arterial Line Insertion Start/End5/17/2022 7:00 AM, 12/15/2020 7:10 AM Performed by: Tressia Miners, CRNA, CRNA  Patient location: Pre-op. Preanesthetic checklist: patient identified, IV checked, site marked, risks and benefits discussed, surgical consent, monitors and equipment checked, pre-op evaluation, timeout performed and anesthesia consent Lidocaine 1% used for infiltration Left, radial was placed Catheter size: 20 G Hand hygiene performed , maximum sterile barriers used  and Seldinger technique used Allen's test indicative of satisfactory collateral circulation Attempts: 1 Procedure performed without using ultrasound guided technique. Following insertion, dressing applied and Biopatch. Post procedure assessment: normal and unchanged

## 2020-12-15 NOTE — H&P (Signed)
[address]       [place] [address]             [phone]          CARDIOTHORACIC SURGERY HISTORY AND PHYSICAL EXAM  Referring Provider is Eartha Inch, MD Primary Cardiologist is Ermalene Searing, MD Local Cardiologist in [place] is O'Neal, Ronnald Ramp, MD PCP is Eartha Inch, MD       Chief Complaint  Patient presents with  . Mitral Regurgitation    Initial surgical consult, ECHO 10/19    HPI:  Patient is a 55 year old male with history of mitral valve prolapse, mitral regurgitation and rheumatoid arthritis who has been referred for surgical consultation to discuss treatment options for management of severe primary mitral regurgitation.  Patient states that he was first told he had a heart murmur in 2017.  At the time he lived in [place] and echocardiogram performed there revealed mitral valve prolapse with what was felt to be moderate mitral regurgitation and normal left ventricular systolic function.  Shortly after that he moved to [facility] where he has established health care follow-up with Dr. Cyndia Bent and Dr. Lenon Curt.  Follow-up transthoracic echocardiograms have documented the presence of mitral valve prolapse with mitral regurgitation that has progressed in severity.  Most recent follow-up echocardiogram performed May 19, 2020 revealed severe prolapse involving the middle scallop of the posterior leaflet causing moderate to severe mitral regurgitation.  Left ventricular systolic function remain normal.  Patient was referred for elective surgical consultation.  Patient is single and lives alone locally in [place].  He is accompanied by his longtime partner who is also the mother of his daughter for his office consultation visit today.  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  He does not exercise at all on a regular basis but he reports no specific physical  limitations.  Within the last few years he has been diagnosed with rheumatoid arthritis for which she has been taking methotrexate for the past year.  This is brought symptoms of arthritis under reasonably good control.  When the patient is not working he does enjoy working around his house and he recently built a Product manager.  He denies any history of symptoms of exertional shortness of breath or chest discomfort.  He has not had any resting shortness of breath, PND, orthopnea, dizzy spells, or syncope.  He reports frequent palpitations that are self-limited.  He has some swelling involving his right lower leg in association with varicose veins.  He does not experience any lower extremity edema on the left side.  Patient was initially seen in consultation on September 28, 2020.  Since then he was evaluated by Dr. Bufford Buttner who performed transesophageal echocardiogram on October 26, 2020.  TEE confirmed the presence of mitral valve prolapse with a very large prolapsing segment involving the majority of the middle scallop (P2) of the posterior leaflet causing severe (4+) mitral regurgitation.  Regurgitant volume was calculated greater than 100 mL with regurgitant fraction 64% and systolic flow reversal in 3 out of 4 pulmonary veins.  Left ventricular systolic function remains normal with ejection fraction 60 to 65%.  There was moderate left atrial enlargement.  No other significant abnormalities were noted.  The patient underwent gated coronary CT angiogram which revealed normal coronary artery anatomy with no significant coronary artery disease and calcium score of 0.  Patient returns to the office today  for follow-up to discuss treatment options further.  He continues to feel well and he specifically denies any symptoms of exertional shortness of breath.  He is eager to proceed with elective surgical repair in the near future.  Patientreturns the office today for follow-up of mitral regurgitation with tentative  plans to proceed with elective mitral valve repair tomorrow.  He was last seen here in our office on November 02, 2020.  He reports no new problems or complaints over the past 6 weeks.  He is accompanied by his daughter for his office consultation visit today.   Past Medical History:  Diagnosis Date  . Mitral regurgitation   . Mitral valve prolapse   . Rheumatoid arthritis [facility])     Past Surgical History:  Procedure Laterality Date  . BUBBLE STUDY  10/26/2020   Procedure: BUBBLE STUDY;  Surgeon: Sande Rives, MD;  Location: [facility] ENDOSCOPY;  Service: Cardiovascular;;  . HERNIA REPAIR    . LAPAROSCOPIC APPENDECTOMY N/A 11/27/2018   Procedure: APPENDECTOMY LAPAROSCOPIC;  Surgeon: Gaynelle Adu, MD;  Location: [facility] ORS;  Service: General;  Laterality: N/A;  . TEE WITHOUT CARDIOVERSION N/A 10/26/2020   Procedure: TRANSESOPHAGEAL ECHOCARDIOGRAM (TEE);  Surgeon: Sande Rives, MD;  Location: [facility] ENDOSCOPY;  Service: Cardiovascular;  Laterality: N/A;    History reviewed. No pertinent family history.  Social History Social History   Tobacco Use  . Smoking status: Former Games developer  . Smokeless tobacco: Never Used  Vaping Use  . Vaping Use: Never used  Substance Use Topics  . Alcohol use: Yes    Comment: 2x week  . Drug use: Never    Prior to Admission medications   Medication Sig Start Date End Date Taking? Authorizing Provider  folic acid (FOLVITE) 1 MG tablet Take 2 mg by mouth daily.   Yes [provider]  methotrexate (RHEUMATREX) 2.5 MG tablet Take 15 mg by mouth every Tuesday. Caution:Chemotherapy. Protect from light.   Yes [provider]  Multiple Vitamin (MULTIVITAMIN WITH MINERALS) TABS tablet Take 1 tablet by mouth daily.   Yes [provider]    No Known Allergies    Review of Systems:              General:                      normal appetite, normal energy, no weight gain, no weight loss, no fever             Cardiac:                        no chest pain with exertion, no chest pain at rest, no SOB with exertion, no resting SOB, no PND, no orthopnea, + palpitations, no arrhythmia, no atrial fibrillation, unilateral right LE edema, no dizzy spells, no syncope             Respiratory:                 no shortness of breath, no home oxygen, no productive cough, no dry cough, no bronchitis, no wheezing, no hemoptysis, no asthma, no pain with inspiration or cough, no sleep apnea, no CPAP at night             GI:                               no difficulty swallowing, no  reflux, no frequent heartburn, no hiatal hernia, no abdominal pain, no constipation, no diarrhea, no hematochezia, no hematemesis, no melena             GU:                              no dysuria,  no frequency, no urinary tract infection, no hematuria, no enlarged prostate, no kidney stones, no kidney disease             Vascular:                     no pain suggestive of claudication, no pain in feet, no leg cramps, + varicose veins, no DVT, no non-healing foot ulcer             Neuro:                         no stroke, no TIA's, no seizures, no headaches, no temporary blindness one eye,  no slurred speech, no peripheral neuropathy, no chronic pain, no instability of gait, no memory/cognitive dysfunction             Musculoskeletal:         + arthritis, + joint swelling, no myalgias, no difficulty walking, normal mobility              Skin:                            no rash, no itching, no skin infections, no pressure sores or ulcerations             Psych:                         no anxiety, no depression, no nervousness, no unusual recent stress             Eyes:                           no blurry vision, no floaters, no recent vision changes, + wears glasses or contacts             ENT:                            no hearing loss, no loose or painful teeth, no dentures, last saw dentist */2021             Hematologic:               no easy bruising, no abnormal  bleeding, no clotting disorder, no frequent epistaxis             Endocrine:                   no diabetes, does not check CBG's at home                           Physical Exam:              BP (!) 167/88 (BP Location: Right Arm, Patient Position: Sitting)   Pulse 99   Resp 20   Ht  (1.753 m)   Wt 170 lb (77.1 kg)   SpO2 95% Comment:  RA  BMI 25.10 kg/m              General:                        well-appearing             HEENT:                       Unremarkable              Neck:                           no JVD, no bruits, no adenopathy              Chest:                          clear to auscultation, symmetrical breath sounds, no wheezes, no rhonchi              CV:                              RRR, grade IV/VI holosystolic murmur              Abdomen:                    soft, non-tender, no masses              Extremities:                 warm, well-perfused, pulses palpable, no LE edema             Rectal/GU                   Deferred             Neuro:                         Grossly non-focal and symmetrical throughout             Skin:                            Clean and dry, no rashes, no breakdown   Diagnostic Tests:  TRANSTHORACIC ECHOCARDIOGRAM:  Images from transthoracic echocardiograms performed November 12, 2019 and May 19, 2020 are reviewed.  The patient has myxomatous degenerative disease with severe prolapse involving a large middle scallop of the posterior leaflet.  It is possible that this leaflet is flail and the patient definitely has what appears to be severe mitral regurgitation, although quantification of the severity of mitral regurgitation was not performed.  The jet of regurgitation is eccentric and courses around the entire left atrium.  There is some left atrial enlargement.  There is normal left ventricular size and systolic function.  The aortic valve appears normal.  Right ventricular size and function appears normal.     TRANSESOPHOGEAL ECHO REPORT       Patient Name:  HAYZEN LORENSON Date of Exam: 10/26/2020  Medical Rec #: [id number]  Height:    69.0 in  Accession #:  [id number] Weight:    170.0 lb  Date of Birth: 31-Mar-1966 BSA:     1.928 m  Patient Age:  54 years  BP:  121/80 mmHg  Patient Gender: M      HR:      92 bpm.  Exam Location: Inpatient   Procedure: Transesophageal Echo, Color Doppler, Cardiac Doppler, Saline  Contrast       Bubble Study and 3D Echo   Indications:   Mitral Regurgitation    History:     Patient has no prior history of Echocardiogram  examinations.          Mitral Valve Prolapse.    Sonographer:   Thurman Coyer RDCS (AE)  Referring Phys: [id number] Ronnald Ramp O'NEAL  Diagnosing Phys: Lennie Odor MD   PROCEDURE: After discussion of the risks and benefits of a TEE, an  informed consent was obtained from the patient. TEE procedure time was 25  minutes. The transesophogeal probe was passed without difficulty through  the esophogus of the patient. Imaged  were obtained with the patient in a left lateral decubitus position. Local  oropharyngeal anesthetic was provided with Cetacaine. Sedation performed  by different physician. The patient was monitored while under deep  sedation. Anesthestetic sedation was  provided intravenously by Anesthesiology:  of Propofol. Image quality  was excellent. The patient's vital signs; including heart rate, blood  pressure, and oxygen saturation; remained stable throughout the procedure.  The patient developed no  complications during the procedure.   IMPRESSIONS    1. There is severe prolapse of the entire posterior mitral valve leaflet  with severe eccentric mitral regurgitation. There is no flail chord. 2D  PISA radius 2.6 cm, 2D ERO (angle correction 57) 1.02 cm2, R vol 153 cc.  By continuity R vol 114 cc, RF 64%.  There is systolic flow reversal in  3/4 pulmonary veins. Overall, severe  mitral regurgitation is present. LVEF is 60%. The mitral valve is  myxomatous. Severe mitral valve regurgitation. No evidence of mitral  stenosis.  2. Left ventricular ejection fraction, by estimation, is 60 to 65%. Left  ventricular ejection fraction by 3D volume is 60 %. The left ventricle has  normal function. The left ventricle has no regional wall motion  abnormalities.  3. Right ventricular systolic function is normal. The right ventricular  size is normal.  4. Left atrial size was mild to moderately dilated. No left atrial/left  atrial appendage thrombus was detected. The LAA emptying velocity was 51  cm/s.  5. The aortic valve is tricuspid. Aortic valve regurgitation is not  visualized. No aortic stenosis is present.  6. Agitated saline contrast bubble study was negative, with no evidence  of any interatrial shunt.   FINDINGS  Left Ventricle: Left ventricular ejection fraction, by estimation, is 60  to 65%. Left ventricular ejection fraction by 3D volume is 60 %. The left  ventricle has normal function. The left ventricle has no regional wall  motion abnormalities. The left  ventricular internal cavity size was normal in size.   Right Ventricle: The right ventricular size is normal. No increase in  right ventricular wall thickness. Right ventricular systolic function is  normal.   Left Atrium: Left atrial size was mild to moderately dilated. No left  atrial/left atrial appendage thrombus was detected. The LAA emptying  velocity was 51 cm/s.   Right Atrium: Right atrial size was normal in size. Prominent Eustachian  valve.   Pericardium: There is no evidence of pericardial effusion.   Mitral Valve: There is severe prolapse of the entire posterior mitral  valve leaflet with severe eccentric mitral regurgitation. There is no  flail chord. 2D  PISA radius 2.6 cm, 2D ERO (angle correction 57) 1.02 cm2,  R vol 153 cc. By continuity  R vol 114  cc, RF 64%. There is systolic flow reversal in 3/4 pulmonary veins.  Overall, severe mitral regurgitation is present. LVEF is 60%. The mitral  valve is myxomatous. Severe mitral valve regurgitation. No evidence of  mitral valve stenosis.   Tricuspid Valve: The tricuspid valve is grossly normal. Tricuspid valve  regurgitation is trivial. No evidence of tricuspid stenosis.   Aortic Valve: The aortic valve is tricuspid. Aortic valve regurgitation is  not visualized. No aortic stenosis is present.   Pulmonic Valve: The pulmonic valve was grossly normal. Pulmonic valve  regurgitation is trivial. No evidence of pulmonic stenosis.   Aorta: The aortic root and ascending aorta are structurally normal, with  no evidence of dilitation. There is minimal (Grade I) plaque involving the  descending aorta.   Venous: A pattern of systolic flow reversal, suggestive of severe mitral  regurgitation is recorded from the left upper pulmonary vein, the right  upper pulmonary vein and the right lower pulmonary vein.   IAS/Shunts: There is right bowing of the interatrial septum, suggestive of  elevated left atrial pressure. No atrial level shunt detected by color  flow Doppler. Agitated saline contrast was given intravenously to evaluate  for intracardiac shunting.  Agitated saline contrast bubble study was negative, with no evidence of  any interatrial shunt.     LEFT VENTRICLE  PLAX 2D  LVOT diam:   2.00 cm  LV SV:     64  LV SV Index:  33       3D Volume EF  LVOT Area:   3.14 cm    LV 3D EF:  Left                       ventricular                       ejection                       fraction by                       3D volume                       is 60 %.                 LV 3D EDV:  159.31 ml                 LV 3D  ESV:  62.44 ml                   3D Volume EF:                 3D EF:    60 %   AORTIC VALVE  LVOT Vmax:  130.00 cm/s  LVOT Vmean: 77.900 cm/s  LVOT VTI:  0.203 m    AORTA  Ao Root diam: 3.40 cm  Ao Asc diam: 3.10 cm   MR Peak grad:  102.4 mmHg TRICUSPID VALVE  MR Mean grad:  62.0 mmHg  TR Peak grad:  16.3 mmHg  MR Vmax:     506.00 cm/s TR Vmax:    202.00 cm/s  MR Vmean:    374.0 cm/s  MR PISA:     42.47  cm  SHUNTS  MR PISA Eff ROA: 323 mm   Systemic VTI: 0.20 m  MR PISA Radius: 2.60 cm   Systemic Diam: 2.00 cm   Lennie Odor MD  Electronically signed by Lennie Odor MD  Signature Date/Time: 10/26/2020/11:19:19 AM     Cardiac TAVR CT  TECHNIQUE: The patient was scanned on a [employer]. A 90 kV retrospective scan was triggered in the descending thoracic aorta at 111 HU's. Gantry rotation speed was 250 msecs and collimation was .6 mm. No beta blockade or nitro were given. The 3D data set was reconstructed in 5% intervals of the R-R cycle. Systolic and diastolic phases were analyzed on a dedicated work station using MPR, MIP and VRT modes. The patient received 80 cc of contrast.  FINDINGS: Image quality: Excellent.  Noise artifact is: Limited.  Coronary Arteries: Normal coronary origins. Right dominance. Coronary calcium score of 0.  Left main: The left main is a large caliber vessel with a normal take off from the left coronary cusp that bifurcates to form a left anterior descending artery and a left circumflex artery. There is no plaque or stenosis.  Left anterior descending artery: The LAD is patent without evidence of plaque or stenosis. The LAD gives off 2 patent diagonal branches.  Left circumflex artery: The LCX is non-dominant and patent with no evidence of plaque or stenosis. The LCX gives off 2 patent obtuse marginal branches.  Right coronary artery:  The RCA is dominant with normal take off from the right coronary cusp. There is no evidence of plaque or stenosis. The RCA terminates as a PDA and right posterolateral branch without evidence of plaque or stenosis.  Cardiac Morphology:  Right Atrium: Right atrial size is within normal limits.  Right Ventricle: The right ventricular cavity is within normal limits.  Left Atrium: Left atrial size is mildly dilated with no left atrial appendage filling defect. A small PFO is present.  Left Ventricle: The ventricular cavity size is within normal limits. There are no stigmata of prior infarction. There is no abnormal filling defect. Normal left ventricular function, LVEF=71%. No regional wall motion abnormalities.  Pulmonary arteries: Normal in size without proximal filling defect.  Pulmonary veins: Normal pulmonary venous drainage.  Pericardium: Normal thickness with no significant effusion or calcium present.  Aortic Valve: Tricuspid without any significant calcifications.  Mitral Valve: The mitral valve is myxomatous. There is severe prolapse of the entire posterior mitral valve leaflet. There does not appear to be a flail segment. There is mild posterior mitral annular calcification.  Aorta: Normal caliber without significant calcification.  Extra-cardiac findings: See attached radiology report for non-cardiac structures.  IMPRESSION: 1. Coronary calcium score of 0. Normal coronary origins. Right dominance.  2. Normal coronary arteries.  3. Myxomatous mitral valve. Severe prolapse of the entire posterior mitral valve leaflet. No apparent flail segment. Mild posterior mitral annular calcification.  4. Small PFO.  5. Normal LV function, LVEF=71%. No regional wall motion abnormalities.  Gerri Spore T. Flora Lipps, MD   Electronically Signed By: Lennie Odor On: 10/07/2020 11:55    CT ANGIOGRAPHY CHEST, ABDOMEN AND  PELVIS  TECHNIQUE: Non-contrast CT of the chest was initially obtained.  Multidetector CT imaging through the chest, abdomen and pelvis was performed using the standard protocol during bolus administration of intravenous contrast. Multiplanar reconstructed images and MIPs were obtained and reviewed to evaluate the vascular anatomy.  CONTRAST: OMNIPAQUE IOHEXOL 350 MG/ML SOLN  COMPARISON: CT the chest, abdomen and pelvis 11/27/2018.  FINDINGS: CTA CHEST FINDINGS  Cardiovascular: Heart size is enlarged with left atrial dilatation. There is no significant pericardial fluid, thickening or pericardial calcification. No atherosclerotic calcifications in the thoracic aorta or the coronary arteries. Mild calcifications of the mitral annulus. No aneurysm or dissection of the thoracic aorta. Ascending thoracic aorta, mid aortic arch and descending thoracic aorta measure 35 mm, 26 mm and 26 mm in diameter respectively.  Mediastinum/Lymph Nodes: No pathologically enlarged mediastinal or hilar lymph nodes. Esophagus is unremarkable in appearance. No axillary lymphadenopathy.  Lungs/Pleura: No suspicious appearing pulmonary nodules or masses are noted. No acute consolidative airspace disease. No pleural effusions.  Musculoskeletal/Soft Tissues: There are no aggressive appearing lytic or blastic lesions noted in the visualized portions of the skeleton.  CTA ABDOMEN AND PELVIS FINDINGS  Hepatobiliary: No suspicious cystic or solid hepatic lesions. No intra or extrahepatic biliary ductal dilatation. Gallbladder is normal in appearance.  Pancreas: No pancreatic mass. No pancreatic ductal dilatation. No pancreatic or peripancreatic fluid collections or inflammatory changes.  Spleen: Unremarkable.  Adrenals/Urinary Tract: Bilateral kidneys and adrenal glands are normal in appearance. No hydroureteronephrosis. Urinary bladder is normal in  appearance.  Stomach/Bowel: Normal appearance of the stomach. No pathologic dilatation of small bowel or colon. 3.9 cm diverticulum extending off the medial aspect of the second portion of the duodenum, without surrounding inflammatory changes to suggest an associated diverticulitis at this time. Status post appendectomy.  Vascular/Lymphatic: No significant atherosclerotic disease, aneurysm or dissection noted in the abdominal or pelvic vasculature. No lymphadenopathy noted in the abdomen or pelvis.  Reproductive: Prostate gland and seminal vesicles are unremarkable in appearance.  Other: No significant volume of ascites. No pneumoperitoneum.  Musculoskeletal: There are no aggressive appearing lytic or blastic lesions noted in the visualized portions of the skeleton.  IMPRESSION: 1. Cardiomegaly with left atrial dilatation. 2. No significant atherosclerotic disease, aneurysm or dissection noted in the thoracoabdominal aorta. 3. Additional incidental findings, as above.   Electronically Signed By: Trudie Reed M.D. On: 10/07/2020 11:04     Impression:  Patient has mitral valve prolapse with stage C1 severe asymptomatic primary mitral regurgitation. I have personally reviewed the patient's recent transesophageal echocardiogram,coronary CT angiogram, and CT angiogram of the chest, abdomen, and pelvis. TEE confirmed the presence of myxomatous degenerative disease with a very large prolapsing segment involving the majority of the middle scallop of the posterior leaflet causing severe mitral regurgitation. There is no significant calcification and no other complicating features. Left ventricular function appears normal. Coronary CT angiogram reveals no evidence for coronary artery disease and normal coronary artery anatomy. Based upon review of the patient's TEE I feel there is greater than 98% likelihood of successful and durable long-term repair with  anticipated risk of operative mortality less than 1%. CT angiogram of the chest, abdomen, and pelvis reveal no contraindication to peripheral cannulation for surgery.  Plan:  The patient and his daughter were again counseled regarding diagnosis of severe primary mitral regurgitation. We went on to discuss the indications, risks and potential benefits of mitral valve repair including a comparison between surgery and continued medical therapy with close follow-up. The likelihood of successful and durable mitral valve repair has been discussed with particular reference to the findings of the most recent echocardiogram. Based upon these findings and previous experience, I have quoted a greater than 98percent likelihood of successful valve repair with less than 1percent risk of mortality or major morbidity. Alternative surgical approaches have been discussed including a comparison between conventional sternotomy and minimally-invasive techniques.  The relative risks and benefits of each have been reviewed as they pertain to the patient's specific circumstances, and expectations for the patient's postoperative convalescence has been discussed.   The patient understands and accepts all potential risks of surgery including but not limited to risk of death, stroke or other neurologic complication, myocardial infarction, congestive heart failure, respiratory failure, renal failure, bleeding requiring transfusion and/or reexploration, arrhythmia, heart block or bradycardia requiring permanent pacemaker insertion, infection or other wound complications, pneumonia, pleural and/or pericardial effusion, pulmonary embolus, aortic dissection or other major vascular complication, or other immediate or delayed complications related to valve repair or replacement including but not limited to recurrent or persistent mitral regurgitation and/or mitral stenosis, LV outflow tract obstruction, aortic insufficiency,  paravalvular leak, posterior AV groove disruption, structural valve deterioration and failure, thrombosis, embolization, or endocarditis.  Specific risks potentially related to the minimally-invasive approach were discussed at length, including but not limited to risk of conversion to full or partial sternotomy, aortic dissection or other major vascular complication, unilateral acute lung injury or pulmonary edema, phrenic nerve dysfunction or paralysis, rib fracture, chronic pain, lung hernia, or lymphocele. All of his questions have been answered.     Salvatore Decent. Cornelius Moras, MD 12/14/2020 10:08 AM

## 2020-12-15 NOTE — Anesthesia Procedure Notes (Signed)
Central Venous Catheter Insertion Performed by: Atilano Median, DO, anesthesiologist Start/End5/17/2022 7:11 AM, 12/15/2020 7:15 AM Patient location: Pre-op. Preanesthetic checklist: patient identified, IV checked, site marked, risks and benefits discussed, surgical consent, monitors and equipment checked, pre-op evaluation, timeout performed and anesthesia consent Hand hygiene performed  and maximum sterile barriers used  PA cath was placed.Swan type:thermodilution Procedure performed without using ultrasound guided technique. Attempts: 4 Patient tolerated the procedure well with no immediate complications. Additional procedure comments: Difficulty with swan placement in pre-operative area despite multiple maneuvers. Will attempt to float in the OR.

## 2020-12-15 NOTE — Op Note (Addendum)
CARDIOTHORACIC SURGERY OPERATIVE NOTE  Date of Procedure:  12/15/2020  Preoperative Diagnosis: Severe Mitral Regurgitation  Postoperative Diagnosis: Same  Procedure:    Minimally-Invasive Mitral Valve Repair  Complex valvuloplasty including artificial Gore-tex neochord placement x10  Medtronic Simuform Ring Annuloplasty (size 37mm, model # [id number], serial # [id number])    Surgeon: Salvatore Decent. 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  Patient states that he was first told he had a heart murmur in 2017. At the time he lived in [place] and echocardiogram performed there revealed mitral valve prolapse with what was felt to be moderate mitral regurgitation and normal left ventricular systolic function. Shortly after that he moved to [place] [facility] where he has established health care follow-up with Dr. Cyndia Bent and Dr. Lenon Curt.Follow-up transthoracic echocardiograms have documented the presence of mitral valve prolapse with mitral regurgitation that has progressed in severity. Most recent follow-up echocardiogram performed May 19, 2020 revealed severe prolapse involving the middle scallop of the posterior leaflet causing moderate to severe mitral regurgitation. Left ventricular systolic function remain  normal. Patient was referred for elective surgical consultation.  Patient was initially seen in consultation on September 28, 2020. Since then he was evaluated by Dr. Bufford Buttner who performed transesophageal echocardiogram on October 26, 2020. TEE confirmed the presence of mitral valve prolapse with a very large prolapsing segment involving the majority of the middle scallop (P2) of the posterior leaflet causing severe (4+) mitral regurgitation. Regurgitant volume was calculated greater than 100 mL with regurgitant fraction 64% and systolic flow reversal in 3 out of 4 pulmonary veins. Left ventricular systolic function remains normal with ejection fraction 60 to 65%. There was moderate left atrial enlargement. No other significant abnormalities were noted. The patient underwent gated coronary CT angiogram which revealed normal coronary artery anatomy with no significant coronary artery disease and calcium score of 0. Patient returns to the office today for follow-up to discuss treatment options further. He continues to feel well and he specifically denies any symptoms of exertional shortness of breath. He is eager to proceed with elective surgical repair in the near future.  Patientreturns the office today for follow-up of mitral regurgitationwith tentative plans to proceed with elective mitral valve repair tomorrow. He was last seen here in our office on November 02, 2020. He reports no new problems or complaints over the past 6 weeks. He is accompanied by his daughter for his office consultation visit today.  The patient has been counseled at length regarding the indications, risks and potential benefits of surgery.  All questions have been answered, and the patient provides full informed consent for the operation as described.    DETAILS OF THE OPERATIVE PROCEDURE  Preparation:  The patient is brought to the operating room on the above mentioned date and central monitoring was established by  the anesthesia team including placement of central venous catheter through the left  internal jugular vein.  Attempts to place a Swan-Ganz catheter were unsuccessful and ultimately abandoned.  A radial arterial line is placed. The patient is placed in the supine position on the operating table.  Intravenous antibiotics are administered. General endotracheal anesthesia is induced uneventfully. The patient is initially intubated using a dual lumen endotracheal tube.  A Foley catheter is placed.  Baseline transesophageal echocardiogram was performed.  Findings were notable for severe prolapse involving the posterior leaflet of the mitral valve with severe mitral regurgitation.  The jet of regurgitation was eccentric.  There was flow reversal in all of the pulmonary veins and a very large regurgitant volume.  There was normal left ventricular size and systolic function.  No other abnormalities were noted.  A soft roll is placed behind the patient's left scapula and the neck gently extended and turned to the left.   The patient's right neck, chest, abdomen, both groins, and both lower extremities are prepared and draped in a sterile manner. A time out procedure is performed.   Percutaneous Vascular Access:  Percutaneous arterial and venous access were obtained on the right side.  Using ultrasound guidance the right common femoral vein was cannulated using the Seldinger technique a pair of Perclose vascular closure devises were placed at opposing 30 degree angles in the femoral vein, after which time an 8 French sheath inserted.  The right common femoral artery was cannulated using a micropuncture wire and sheath.  A pair of Perclose vascular closure devices were placed at opposing 30 degree angles in the femoral artery, and a 8 French sheath inserted.  The right internal jugular vein was cannulated  using ultrasound guidance and an 8 French sheath inserted.     Surgical Approach:  A right miniature  anterolateral thoracotomy incision is performed. The incision is placed just lateral to and superior to the right nipple. The pectoralis major muscle is retracted medially and completely preserved. The right pleural space is entered through the 3rd intercostal space. A soft tissue retractor is placed.  Two 11 mm ports are placed through separate stab incisions inferiorly. The right pleural space is insufflated continuously with carbon dioxide gas through the posterior port during the remainder of the operation.  A pledgeted sutures placed through the dome of the right hemidiaphragm and retracted inferiorly to facilitate exposure.  A longitudinal incision is made in the pericardium 3 cm anterior to the phrenic nerve and silk traction sutures are placed on either side of the incision for exposure.   Extracorporeal Cardiopulmonary Bypass and Myocardial Protection:   The patient was heparinized systemically.  The right common femoral vein is cannulated through the venous sheath and a guidewire advanced into the right atrium using TEE guidance.  The femoral vein cannulated using a 22 Fr long femoral venous cannula.  The right common femoral artery is cannulated through the arterial sheath and a guidewire advanced into the descending thoracic aorta using TEE guidance.  Femoral artery is cannulated with a 18 French femoral arterial cannula.  The right internal jugular vein is cannulated through the venous sheath and a guidewire advanced into the right atrium.  The internal jugular vein is cannulated using a 15 [place] pediatric femoral venous cannula.   Adequate heparinization is verified.   The entire pre-bypass portion of the operation was notable for stable hemodynamics.  Cardiopulmonary bypass was begun.  Vacuum assist venous drainage is utilized. The incision in the pericardium is extended in both directions. Venous drainage and exposure are notably excellent. A  retrograde cardioplegia cannula is placed  through the right atrium into the coronary sinus using transesophageal echocardiogram guidance.  An antegrade cardioplegia cannula is placed in the ascending aorta.    The patient is cooled to 32C systemic temperature.  The aortic cross clamp is applied and cardioplegia is delivered initially in an antegrade fashion through the aortic root using modified [place] cold blood cardioplegia ([place] blood cardioplegia protocol).   The initial cardioplegic arrest is rapid with early diastolic arrest. Myocardial protection was felt to be excellent.   Mitral Valve Repair:  A left atriotomy incision was performed through the interatrial groove and extended partially across the back wall of the left atrium after opening the oblique sinus inferiorly.  The mitral valve is exposed using a self-retaining retractor.  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  There was a very large P2 segment of the posterior leaflet which was severely prolapsing.  All of the primary chordae tendinae to P2 were severely elongated but none were ruptured.  The remainder of the valve was fairly normal including relatively normal appearance of the anterior leaflet.  Artificial neochord placement was performed using Chord-X multi-strand CV-4 Goretex pre-measured loops.  The appropriate cord length (16mm) was measured from corresponding normal length primary cords from the P1 segment of the posterior leaflet. The papillary muscle suture of a Chord-X multi-strand suture was placed through the head of the anterior papillary muscle in a horizontal mattress fashion and tied over Teflon felt pledgets. Each of the three pre-measured loops were then reimplanted into the free margin of the P2 segment of the posterior leaflet on the anterior side of midline.  The papillary muscle suture of a second Chord-X multi-strand suture was placed through the head of the posterior papillary muscle  in a horizontal mattress fashion and tied over Teflon felt pledgets. Two of the three pre-measured loops were then reimplanted into the free margin of the P2 segment of the posterior leaflet on the posterior side of midline.  The last pair was discarded.  Interrupted 2-0 Ethibond horizontal mattress sutures are placed circumferentially around the entire mitral valve annulus. The sutures will ultimately be utilized for ring annuloplasty, and at this juncture there are utilized to suspend the valve symmetrically.  The valve was tested with saline and appeared competent even without ring annuloplasty complete. The valve was sized to a 34 mm annuloplasty ring, based upon the transverse distance between the left and right commissures and the height of the anterior leaflet, corresponding to a size just slightly larger than the overall surface area of the anterior leaflet.  A Medtronic Simuform annuloplasty ring (size 34mm, catalog [id number], serial [id number]) was secured in place uneventfully. All ring sutures were secured using a Cor-knot device.    The valve was tested with saline and appeared competent. There is no residual leak. There was a broad, symmetrical line of coaptation of the anterior and posterior leaflet which was confirmed using the blue ink test.  Rewarming is begun.   Procedure Completion:  The atriotomy was closed using a 2-layer closure of running 3-0 Prolene suture after placing a sump drain across the mitral valve to serve as a left ventricular vent.  One final dose of warm retrograde "reanimation dose" cardioplegia was administered retrograde through the coronary sinus catheter while all air was evacuated through the aortic root.  The aortic cross clamp was removed after a total cross clamp time of 88 minutes.  Epicardial pacing wires are fixed to the inferior wall of the right ventricule and to the right atrial appendage. The patient is rewarmed to 37C temperature. The left ventricular  vent and antegrade cardioplegia cannula are removed.  The pericardial sac was drained using a 32 French Bard drain placed through the anterior port incision. The patient is weaned and disconnected from cardiopulmonary bypass.  The patient's rhythm at separation from bypass was AV paced.  The patient was weaned from bypass without any inotropic support. Total cardiopulmonary bypass time for the operation was 128 minutes.  Followup transesophageal echocardiogram performed after separation from bypass revealed a well-seated annuloplasty ring in the mitral position with a normal functioning mitral valve. There was no residual leak.  Left ventricular function was unchanged from preoperatively.  The mean gradient across the mitral valve was estimated to be 2-3 mmHg.  The femoral arterial and venous cannulas were removed and all Perclose sutures secured.  Manual pressure was maintained while Protamine was administered.  The right internal jugular cannula was removed and manual pressure held on the neck and groin for 15 minutes.  Single lung ventilation was begun. The atriotomy closure was inspected for hemostasis.  The right pleural space is irrigated with saline solution and inspected for hemostasis.   A mixture of Exparel liposomal bupivacaine (20 mL) and 0.5% bupivacaine (30 mL) is utilized to create an intercostal nerve block for postoperative analgesia.  The mixture is injected under direct vision into the intercostal neurovascular bundles posteriorly to cover the second through the sixth intercostal nerve roots.  Portions of the solution are also injected into the intercostal neurovascular bundles immediately surrounding the surgical incision and immediately adjacent to the chest tube exit sites.  The right pleural space was drained using a 32 French Bard drain placed through the posterior port incision. The miniature thoracotomy incision was closed in multiple layers in routine fashion.   The post-bypass  portion of the operation was notable for stable rhythm and hemodynamics.  No blood products were administered during the operation.   Disposition:  The patient tolerated the procedure well.  The patient was extubated in the operating room and subsequently transported to the surgical intensive care unit in stable condition. There were no intraoperative complications. All sponge instrument and needle counts are verified correct at completion of the operation.     Salvatore Decent. Cornelius Moras MD 12/15/2020 1:33 PM

## 2020-12-15 NOTE — Anesthesia Procedure Notes (Signed)
Central Venous Catheter Insertion Performed by: Atilano Median, DO, anesthesiologist Start/End5/17/2022 7:00 AM, 12/15/2020 7:10 AM Patient location: Pre-op. Preanesthetic checklist: patient identified, IV checked, site marked, risks and benefits discussed, surgical consent, monitors and equipment checked, pre-op evaluation, timeout performed and anesthesia consent Lidocaine 1% used for infiltration and patient sedated Hand hygiene performed  and maximum sterile barriers used  Catheter size: 8.5 Fr Sheath introducer Procedure performed using ultrasound guided technique. Ultrasound Notes:anatomy identified, needle tip was noted to be adjacent to the nerve/plexus identified, no ultrasound evidence of intravascular and/or intraneural injection and image(s) printed for medical record Attempts: 1 Following insertion, line sutured, dressing applied and Biopatch. Post procedure assessment: blood return through all ports, free fluid flow and no air  Patient tolerated the procedure well with no immediate complications.

## 2020-12-15 NOTE — Transfer of Care (Signed)
Immediate Anesthesia Transfer of Care Note  Patient: Perry Lewis  Procedure(s) Performed: 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 Chest) TRANSESOPHAGEAL ECHOCARDIOGRAM (TEE) (N/A )  Patient Location: ICU  Anesthesia Type:General  Level of Consciousness: awake and alert   Airway & Oxygen Therapy: Patient Spontanous Breathing and Patient connected to face mask oxygen  Post-op Assessment: Report given to RN and Post -op Vital signs reviewed and stable  Post vital signs: Reviewed and stable  Last Vitals:  Vitals Value Taken Time  BP 122/65 12/15/20 1435  Temp    Pulse 80 12/15/20 1435  Resp 18 12/15/20 1435  SpO2 100 % 12/15/20 1435  Vitals shown include unvalidated device data.  Last Pain:  Vitals:   12/15/20 0559  PainSc: 0-No pain      Patients Stated Pain Goal: 2 (12/15/20 0559)  Complications: No complications documented.

## 2020-12-15 NOTE — Brief Op Note (Signed)
12/15/2020  12:15 PM  PATIENT:  Perry Lewis  55 y.o. male  PRE-OPERATIVE DIAGNOSIS:  MR  POST-OPERATIVE DIAGNOSIS:  MR  PROCEDURE:  Procedure(s):  MINIMALLY INVASIVE MITRAL VALVE REPAIR  -Ring Annuloplasty with a 34 mm Medtronic Simuform Ring - Placement of Gore-tex Neochord x 10 via chord-x system  TRANSESOPHAGEAL ECHOCARDIOGRAM (TEE) (N/A)  SURGEON:  Surgeon(s) and Role:    Purcell Nails, MD - Primary  PHYSICIAN ASSISTANT: Erin Barrett PA-C  ANESTHESIA:   general  EBL:  358 mL  BLOOD ADMINISTERED: CELLSAVER  DRAINS: Left Pleural Chest Tubes   LOCAL MEDICATIONS USED:  BUPIVICAINE   SPECIMEN:  No Specimen  DISPOSITION OF SPECIMEN:  N/A  COUNTS:  YES  TOURNIQUET:  * No tourniquets in log *  DICTATION: .Dragon Dictation  PLAN OF CARE: Admit to inpatient   PATIENT DISPOSITION:  ICU - extubated and stable.   Delay start of Pharmacological VTE agent (>24hrs) due to surgical blood loss or risk of bleeding: yes

## 2020-12-15 NOTE — Interval H&P Note (Signed)
History and Physical Interval Note:  12/15/2020 6:11 AM  Perry Lewis  has presented today for surgery, with the diagnosis of MR.  The various methods of treatment have been discussed with the patient and family. After consideration of risks, benefits and other options for treatment, the patient has consented to  Procedure(s): 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) TRANSESOPHAGEAL ECHOCARDIOGRAM (TEE) (N/A) as a surgical intervention.  The patient's history has been reviewed, patient examined, no change in status, stable for surgery.  I have reviewed the patient's chart and labs.  Questions were answered to the patient's satisfaction.     Purcell Nails

## 2020-12-16 ENCOUNTER — Inpatient Hospital Stay (HOSPITAL_COMMUNITY): Payer: BC Managed Care – PPO

## 2020-12-16 ENCOUNTER — Encounter (HOSPITAL_COMMUNITY): Payer: Self-pay | Admitting: Thoracic Surgery (Cardiothoracic Vascular Surgery)

## 2020-12-16 ENCOUNTER — Other Ambulatory Visit: Payer: Self-pay

## 2020-12-16 DIAGNOSIS — I340 Nonrheumatic mitral (valve) insufficiency: Secondary | ICD-10-CM

## 2020-12-16 DIAGNOSIS — Z9889 Other specified postprocedural states: Secondary | ICD-10-CM

## 2020-12-16 DIAGNOSIS — I341 Nonrheumatic mitral (valve) prolapse: Secondary | ICD-10-CM

## 2020-12-16 LAB — BASIC METABOLIC PANEL
Anion gap: 5 (ref 5–15)
Anion gap: 6 (ref 5–15)
BUN: 5 mg/dL — ABNORMAL LOW (ref 6–20)
BUN: 9 mg/dL (ref 6–20)
CO2: 22 mmol/L (ref 22–32)
CO2: 23 mmol/L (ref 22–32)
Calcium: 7.7 mg/dL — ABNORMAL LOW (ref 8.9–10.3)
Calcium: 8.1 mg/dL — ABNORMAL LOW (ref 8.9–10.3)
Chloride: 102 mmol/L (ref 98–111)
Chloride: 94 mmol/L — ABNORMAL LOW (ref 98–111)
Creatinine, Ser: 0.58 mg/dL — ABNORMAL LOW (ref 0.61–1.24)
Creatinine, Ser: 0.81 mg/dL (ref 0.61–1.24)
GFR, Estimated: >60 mL/min (ref >60)
GFR, Estimated: >60 mL/min (ref >60)
Glucose, Bld: 100 mg/dL — ABNORMAL HIGH (ref 70–99)
Glucose, Bld: 173 mg/dL — ABNORMAL HIGH (ref 70–99)
Potassium: 4.1 mmol/L (ref 3.5–5.1)
Potassium: 4 mmol/L (ref 3.5–5.1)
Sodium: 129 mmol/L — ABNORMAL LOW (ref 135–145)
Sodium: 123 mmol/L — ABNORMAL LOW (ref 135–145)

## 2020-12-16 LAB — CBC
HCT: 30.9 % — ABNORMAL LOW (ref 39.0–52.0)
HCT: 32 % — ABNORMAL LOW (ref 39.0–52.0)
Hemoglobin: 10.9 g/dL — ABNORMAL LOW (ref 13.0–17.0)
Hemoglobin: 10.9 g/dL — ABNORMAL LOW (ref 13.0–17.0)
MCH: 32.2 pg (ref 26.0–34.0)
MCH: 31.6 pg (ref 26.0–34.0)
MCHC: 35.3 g/dL (ref 30.0–36.0)
MCHC: 34.1 g/dL (ref 30.0–36.0)
MCV: 91.4 fL (ref 80.0–100.0)
MCV: 92.8 fL (ref 80.0–100.0)
Platelets: 196 10*3/uL (ref 150–400)
Platelets: 220 10*3/uL (ref 150–400)
RBC: 3.38 MIL/uL — ABNORMAL LOW (ref 4.22–5.81)
RBC: 3.45 MIL/uL — ABNORMAL LOW (ref 4.22–5.81)
RDW: 13.1 % (ref 11.5–15.5)
RDW: 13.2 % (ref 11.5–15.5)
WBC: 11.1 10*3/uL — ABNORMAL HIGH (ref 4.0–10.5)
WBC: 15.5 10*3/uL — ABNORMAL HIGH (ref 4.0–10.5)
nRBC: 0 % (ref 0.0–0.2)
nRBC: 0 % (ref 0.0–0.2)

## 2020-12-16 LAB — GLUCOSE, CAPILLARY
Glucose-Capillary: 108 mg/dL — ABNORMAL HIGH (ref 70–99)
Glucose-Capillary: 112 mg/dL — ABNORMAL HIGH (ref 70–99)
Glucose-Capillary: 112 mg/dL — ABNORMAL HIGH (ref 70–99)
Glucose-Capillary: 117 mg/dL — ABNORMAL HIGH (ref 70–99)
Glucose-Capillary: 120 mg/dL — ABNORMAL HIGH (ref 70–99)
Glucose-Capillary: 128 mg/dL — ABNORMAL HIGH (ref 70–99)
Glucose-Capillary: 94 mg/dL (ref 70–99)
Glucose-Capillary: 94 mg/dL (ref 70–99)
Glucose-Capillary: 167 mg/dL — ABNORMAL HIGH (ref 70–99)
Glucose-Capillary: 158 mg/dL — ABNORMAL HIGH (ref 70–99)
Glucose-Capillary: 128 mg/dL — ABNORMAL HIGH (ref 70–99)
Glucose-Capillary: 110 mg/dL — ABNORMAL HIGH (ref 70–99)

## 2020-12-16 LAB — MAGNESIUM
Magnesium: 2.3 mg/dL (ref 1.7–2.4)
Magnesium: 2.1 mg/dL (ref 1.7–2.4)

## 2020-12-16 MED ORDER — ENOXAPARIN SODIUM 40 MG/0.4ML IJ SOSY
40.0000 mg | PREFILLED_SYRINGE | Freq: Every day | INTRAMUSCULAR | Status: DC
  Administered 2020-12-17 – 2020-12-18 (×2): 40 mg via SUBCUTANEOUS
  Filled 2020-12-16 (×3): qty 0.4

## 2020-12-16 MED ORDER — INSULIN ASPART 100 UNIT/ML IJ SOLN
0.0000 [IU] | INTRAMUSCULAR | Status: DC
  Administered 2020-12-16 (×2): 2 [IU] via SUBCUTANEOUS
  Administered 2020-12-16: 4 [IU] via SUBCUTANEOUS

## 2020-12-16 MED ORDER — WARFARIN - PHYSICIAN DOSING INPATIENT: Freq: Every day | Status: DC

## 2020-12-16 MED ORDER — ASPIRIN EC 325 MG PO TBEC
325.0000 mg | DELAYED_RELEASE_TABLET | Freq: Every day | ORAL | Status: AC
  Administered 2020-12-16: 325 mg via ORAL
  Filled 2020-12-16: qty 1

## 2020-12-16 MED ORDER — ASPIRIN EC 81 MG PO TBEC
81.0000 mg | DELAYED_RELEASE_TABLET | Freq: Every day | ORAL | Status: DC
  Administered 2020-12-17 – 2020-12-19 (×3): 81 mg via ORAL
  Filled 2020-12-16 (×3): qty 1

## 2020-12-16 MED ORDER — WARFARIN SODIUM 2.5 MG PO TABS
2.5000 mg | ORAL_TABLET | Freq: Every day | ORAL | Status: DC
  Administered 2020-12-16 – 2020-12-17 (×2): 2.5 mg via ORAL
  Filled 2020-12-16 (×2): qty 1

## 2020-12-16 NOTE — Progress Notes (Signed)
[address]       [name] [address]             [phone]        CARDIOTHORACIC SURGERY PROGRESS NOTE   R1 Day Post-Op Procedure(s) (LRB): 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) TRANSESOPHAGEAL ECHOCARDIOGRAM (TEE) (N/A)  Subjective: Feels sore in chest.  Denies SOB  Objective: Vital signs: BP Readings from Last 1 Encounters:  12/16/20 97/69   Pulse Readings from Last 1 Encounters:  12/16/20 79   Resp Readings from Last 1 Encounters:  12/16/20 14   Temp Readings from Last 1 Encounters:  12/16/20 98.2 F (36.8 C) (Oral)    Hemodynamics: CVP:  [0 mmHg-25 mmHg] 13 mmHg  Physical Exam:  Rhythm:   sinus  Breath sounds: Shallow but clear  Heart sounds:  RRR w/ systolic murmur LLSB  Incisions:  Dressings dry, intact  Abdomen:  Soft, non-distended, non-tender  Extremities:  Warm, well-perfused  Chest tubes:  low volume thin serosanguinous output, no air leak    Intake/Output from previous day: 05/17 0701 - 05/18 0700 In: 5010.9 [I.V.:3535.7; Blood:501; IV Piggyback:974.3] Out: 5784 [Urine:4355; Blood:859; Chest Tube:570] Intake/Output this shift: Total I/O In: 240 [P.O.:240] Out: 130 [Urine:60; Chest Tube:70]  Lab Results:  CBC: Recent Labs    12/15/20 2005 12/16/20 0445  WBC 11.9* 11.1*  HGB 11.4* 10.9*  HCT 33.1* 30.9*  PLT 179 196    BMET:  Recent Labs    12/15/20 2005 12/16/20 0445  NA 130* 129*  K 3.8 4.1  CL 105 102  CO2 22 22  GLUCOSE 108* 100*  BUN 6 5*  CREATININE 0.50* 0.58*  CALCIUM 7.2* 7.7*     PT/INR:   Recent Labs    12/15/20 1451  LABPROT 16.4*  INR 1.3*    CBG (last 3)  Recent Labs    12/15/20 2332 12/16/20 0445 12/16/20 0815  GLUCAP 112* 108* 167*    ABG    Component Value Date/Time   PHART 7.287 (L) 12/15/2020 1445   PCO2ART 51.6 (H) 12/15/2020 1445   PO2ART 158 (H) 12/15/2020 1445   HCO3 24.7 12/15/2020 1445   TCO2 26 12/15/2020 1445   ACIDBASEDEF 2.0  12/15/2020 1445   O2SAT 99.0 12/15/2020 1445    CXR: PORTABLE CHEST 1 VIEW  COMPARISON:  Dec 15, 2020  FINDINGS: Chest tube on the right unchanged in position. Apparent mediastinal drain. Central catheter tip in left innominate vein. Temporary pacemaker wires attached to right heart. Persistent trace apical right pneumothorax. There is mild subcutaneous air on the right. There is bibasilar atelectasis. There is stable cardiomegaly with prosthetic valve present. Pulmonary vascularity normal. No adenopathy no bone lesions.  IMPRESSION: Tube and catheter positions as described. Trace right apical pneumothorax without tension component. Bibasilar atelectasis. Stable cardiac prominence with postoperative change.   Electronically Signed   By: Bretta Bang III M.D.   On: 12/16/2020 08:23    Assessment/Plan: S/P Procedure(s) (LRB): 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) TRANSESOPHAGEAL ECHOCARDIOGRAM (TEE) (N/A)  Overall doing well POD1 Maintaining NSR w/ stable hemodynamics although still on Neo for BP support and CVP low 7-9 Breathing comfortably w/ O2 sats 95% on room air Systolic murmur at LLSB suspicious for dynamic LVOTO but no murmur at apex - possible SAM    Keep pacer off and add beta blocker if HR increases  Give volume  Recheck closely and consider f/u ECHO later today or tomorrow  Mobilize  Purcell Nails, MD 12/16/2020 9:41 AM

## 2020-12-16 NOTE — Progress Notes (Signed)
      [address]       [place] [address]             [phone]      POD 1 mitral repair  BP 107/73   Pulse 66   Temp (!) 97.3 F (36.3 C) (Axillary)   Resp 15   Ht 5\' 9"  (1.753 m)   Wt 78.5 kg   SpO2 99%   BMI 25.56 kg/m   Intake/Output Summary (Last 24 hours) at 12/16/2020 1814 Last data filed at 12/16/2020 1700 Gross per 24 hour  Intake 2609.12 ml  Output 2410 ml  Net 199.12 ml   K= 4.0 Creatinine 0.81 Hct= 32  Doing well POD # 1  Samika Vetsch C. 12/18/2020, MD [facility] [phone]

## 2020-12-17 ENCOUNTER — Inpatient Hospital Stay (HOSPITAL_COMMUNITY): Payer: BC Managed Care – PPO

## 2020-12-17 DIAGNOSIS — Z954 Presence of other heart-valve replacement: Secondary | ICD-10-CM | POA: Diagnosis not present

## 2020-12-17 LAB — BASIC METABOLIC PANEL
Anion gap: 4 — ABNORMAL LOW (ref 5–15)
BUN: 9 mg/dL (ref 6–20)
CO2: 26 mmol/L (ref 22–32)
Calcium: 8.1 mg/dL — ABNORMAL LOW (ref 8.9–10.3)
Chloride: 93 mmol/L — ABNORMAL LOW (ref 98–111)
Creatinine, Ser: 0.69 mg/dL (ref 0.61–1.24)
GFR, Estimated: >60 mL/min (ref >60)
Glucose, Bld: 119 mg/dL — ABNORMAL HIGH (ref 70–99)
Potassium: 4 mmol/L (ref 3.5–5.1)
Sodium: 123 mmol/L — ABNORMAL LOW (ref 135–145)

## 2020-12-17 LAB — CBC
HCT: 29.8 % — ABNORMAL LOW (ref 39.0–52.0)
Hemoglobin: 10.4 g/dL — ABNORMAL LOW (ref 13.0–17.0)
MCH: 31.9 pg (ref 26.0–34.0)
MCHC: 34.9 g/dL (ref 30.0–36.0)
MCV: 91.4 fL (ref 80.0–100.0)
Platelets: 171 10*3/uL (ref 150–400)
RBC: 3.26 MIL/uL — ABNORMAL LOW (ref 4.22–5.81)
RDW: 13.1 % (ref 11.5–15.5)
WBC: 11.5 10*3/uL — ABNORMAL HIGH (ref 4.0–10.5)
nRBC: 0 % (ref 0.0–0.2)

## 2020-12-17 LAB — ECHOCARDIOGRAM LIMITED
AV Mean grad: 9.2 mmHg
AV Peak grad: 19.2 mmHg
Ao pk vel: 2.19 m/s
Area-P 1/2: 2.73 cm2
Calc EF: 49.8 %
Height: 69 in
S' Lateral: 3.3 cm
Single Plane A2C EF: 53.7 %
Single Plane A4C EF: 45.2 %
Weight: 2920.65 oz

## 2020-12-17 LAB — PROTIME-INR
INR: 1.2 (ref 0.8–1.2)
Prothrombin Time: 15.5 seconds — ABNORMAL HIGH (ref 11.4–15.2)

## 2020-12-17 LAB — GLUCOSE, CAPILLARY
Glucose-Capillary: 102 mg/dL — ABNORMAL HIGH (ref 70–99)
Glucose-Capillary: 111 mg/dL — ABNORMAL HIGH (ref 70–99)
Glucose-Capillary: 117 mg/dL — ABNORMAL HIGH (ref 70–99)
Glucose-Capillary: 153 mg/dL — ABNORMAL HIGH (ref 70–99)
Glucose-Capillary: 96 mg/dL (ref 70–99)

## 2020-12-17 MED ORDER — FE FUMARATE-B12-VIT C-FA-IFC PO CAPS
1.0000 | ORAL_CAPSULE | Freq: Every day | ORAL | Status: DC
  Administered 2020-12-17 – 2020-12-19 (×3): 1 via ORAL
  Filled 2020-12-17 (×4): qty 1

## 2020-12-17 MED ORDER — SODIUM CHLORIDE 0.9% FLUSH
10.0000 mL | Freq: Two times a day (BID) | INTRAVENOUS | Status: DC
  Administered 2020-12-17 – 2020-12-19 (×4): 10 mL

## 2020-12-17 MED ORDER — SODIUM CHLORIDE 0.9% FLUSH: 10.0000 mL | INTRAVENOUS | Status: DC | PRN

## 2020-12-17 MED ORDER — PERFLUTREN LIPID MICROSPHERE
1.0000 mL | INTRAVENOUS | Status: AC | PRN
  Administered 2020-12-17: 2 mL via INTRAVENOUS
  Filled 2020-12-17: qty 10

## 2020-12-17 MED ORDER — METOPROLOL TARTRATE 12.5 MG HALF TABLET
12.5000 mg | ORAL_TABLET | Freq: Two times a day (BID) | ORAL | Status: DC
  Administered 2020-12-17 (×2): 12.5 mg via ORAL
  Filled 2020-12-17 (×2): qty 1

## 2020-12-17 MED ORDER — ~~LOC~~ CARDIAC SURGERY, PATIENT & FAMILY EDUCATION: Freq: Once | Status: AC

## 2020-12-17 MED ORDER — MELATONIN 5 MG PO TABS
5.0000 mg | ORAL_TABLET | Freq: Every evening | ORAL | Status: DC | PRN
  Administered 2020-12-17 – 2020-12-18 (×2): 5 mg via ORAL
  Filled 2020-12-17 (×2): qty 1

## 2020-12-17 MED FILL — Heparin Sodium (Porcine) Inj 1000 Unit/ML: INTRAMUSCULAR | Qty: 30 | Status: AC

## 2020-12-17 MED FILL — Potassium Chloride Inj 2 mEq/ML: INTRAVENOUS | Qty: 40 | Status: AC

## 2020-12-17 MED FILL — Lidocaine HCl Local Preservative Free (PF) Inj 2%: INTRAMUSCULAR | Qty: 15 | Status: AC

## 2020-12-17 NOTE — Discharge Instructions (Signed)
Discharge Instructions:  1. You may shower, please wash incisions daily with soap and water and keep dry.  If you wish to cover wounds with dressing you may do so but please keep clean and change daily.  No tub baths or swimming until incisions have completely healed.  If your incisions become red or develop any drainage please call our office at [phone]  2. No Driving until cleared by Dr. Orvan July office and you are no longer using narcotic pain medications  3. Monitor your weight daily.. Please use the same scale and weigh at same time... If you gain 5-10 lbs in 48 hours with associated lower extremity swelling, please contact our office at [phone]  4. Fever of 101.5 for at least 24 hours with no source, please contact our office at [phone]  5. Activity- up as tolerated, please walk at least 3 times per day.  Avoid strenuous activity, no lifting, pushing, or pulling with your arms over 8-10 lbs for a minimum of 6 weeks  6. If any questions or concerns arise, please do not hesitate to contact our office at [phone]   Warfarin Information Warfarin is a blood thinner (anticoagulant). Anticoagulants help to prevent the formation of blood clots. They also help to stop the growth of blood clots. Your health care provider will monitor the anticoagulation effect of warfarin closely and will adjust your medicine as needed. Who should use warfarin? Warfarin is prescribed for people who have blood clots, or who are at risk for developing harmful blood clots, such as people who:  Have mechanical heart valves.  Have irregular heart rhythms (atrial fibrillation).  Have certain clotting disorders.  Have had blood clots in the past or are currently receiving treatment for them. This includes people who have had a stroke, blood clots in the lungs (pulmonary embolism, or PE), or blood clots in the legs (deep vein thrombosis, or DVT). How is warfarin taken? Warfarin is taken by mouth  (orally). Warfarin tablets come in different strengths. The strength is printed on the tablet and each strength is a different color. If you get a new prescription filled and the color of your tablet is different than usual, tell your pharmacist or health care provider immediately.  Take warfarin exactly as told by your health care provider. Doing this helps you avoid bleeding or blood clots that could result in serious injury, pain, or disability.  Take your medicine at the same time every day. If you forget to take your dose of warfarin, take it as soon as you remember that day. If you do not remember on that day, do not take an extra dose the next day.  Contact your health care provider if you miss or take an extra dose. Do not change your dosage on your own to make up for missed or extra doses. What blood tests do I need while taking warfarin? Warfarin is a medicine that needs to be closely monitored. It is very important to keep all lab visits and follow-up visits with your health care provider. While taking warfarin, you will need to have blood tests regularly to measure your blood clotting time. These tests are called prothrombin tests (PT)or international normalized ratio (INR) tests. These tests can be done with a finger stick or a blood draw. What does the INR test result mean? The PT test results will be reported as the INR. The INR tells your health care provider whether your dosage of warfarin needs to be changed. The longer it  takes your blood to clot, the higher the INR. Your health care provider will tell you your target INR range. If your INR is not in your target range, your health care provider may adjust your dosage.  If your INR is above your target range, there is a risk of bleeding. Your dosage of warfarin may need to be decreased.  If your INR is below your target range, there is a risk of clotting. Your dosage of warfarin may need to be increased. How often is the INR test  needed?  When you first start warfarin, you will usually have your INR checked every few days.  You may need to have INR tests done more than once a week until the health care provider determines the correct dosage of warfarin.  After you have reached your target INR, your INR will be tested less often. However, you will need to have your INR checked at least once every 4-6 weeks for the entire time you are taking warfarin.  Some people may qualify for a home monitoring program to check their INR. Ask your health care provider if you qualify for this program.   What are the side effects of warfarin? Too much warfarin can cause bleeding or hemorrhage in any part of the body, such as:  Bleeding from the gums.  Unexplained bruises or bruises that get larger.  Blood in the urine or stools (feces) that are bloody or dark in color.  Bleeding in the brain (hemorrhagic stroke).  A nosebleed that is not easily stopped.  Coughing up or vomiting blood. Warfarin use may also cause:  Skin rash or irritations.  Nausea that does not go away.  Severe pain in the back or joints.  Painful toes that turn blue or purple (purple toe syndrome).  Painful ulcers that do not go away (skin necrosis). What precautions do I need to take while using warfarin?  Wear or carry identification that says that you are taking warfarin.  Make sure that all health care providers, including your dentist, know you are taking warfarin.  If you need surgery, talk with your health care provider about whether you should stop taking warfarin before your surgery.  Avoid situations that cause bleeding. You may bleed more easily while taking warfarin. To limit bleeding, take the following actions: ? Use a softer toothbrush. ? Floss with waxed floss, not unwaxed floss. ? Shave with an electric razor, not with a blade. ? Limit your use of sharp objects. ? Avoid activities that put you at risk for injury, such as contact  sports. What do I need to know about warfarin and pregnancy or breastfeeding?  Warfarin is not recommended during the first trimester of pregnancy due to an increased risk of birth defects. In certain situations, a woman may take warfarin after her first trimester of pregnancy.  If you are taking warfarin and you become pregnant or plan to become pregnant, contact your health care provider right away.  If you plan to breastfeed while taking warfarin, talk with your health care provider first. What do I need to know about warfarin and alcohol or drug use?  Avoid drinking alcohol, or limit alcohol intake to no more than 1 drink a day for nonpregnant women and 2 drinks a day for men. ? Be aware of how much alcohol is in your drink. In the [place] one drink equals one 12 oz bottle of beer (355 mL), one 5 oz glass of wine (148 mL), or one 1  oz glass of hard liquor (44 mL). ? If you change the amount of alcohol that you drink, tell your health care provider. Your warfarin dosage may need to be changed.  Avoid tobacco products, such as cigarettes, e-cigarettes, and chewing tobacco. If you need help quitting, ask your health care provider. ? If you change the amount of nicotine or tobacco that you use, tell your health care provider. Your warfarin dosage may need to be changed.  Avoid street drugs while taking warfarin. The effects of street drugs on warfarin are not known. What do I need to know about warfarin and other medicines or supplements?  Many prescription and over-the-counter medicines can interfere with warfarin. Talk with your health care provider or your pharmacist before starting or stopping any new medicines. This includes over-the-counter vitamins, dietary supplements, herbal medicines, and pain medicines. Your warfarin dosage may need to be adjusted.  Some common over-the-counter medicines that may increase the risk of bleeding while taking warfarin include: ? Aspirin. ? NSAIDs, such  as ibuprofen or naproxen. ? Vitamin E. ? Fish oils. What do I need to know about warfarin and my diet?  It is important to maintain a normal, balanced diet while taking warfarin. Avoid major changes in your diet. If you are going to change your diet, talk with your health care provider before making changes.  Your health care provider may recommend that you work with a dietitian.  Vitamin K decreases the effect of warfarin, and it is found in many foods. Eat a consistent amount of foods that contain vitamin K. For example, you may decide to eat 2 vitamin K-containing foods each day. Contact a health care provider if:  You miss a dose.  You take an extra dose.  You plan to have any kind of surgery or procedure.  You are unable to take your medicine due to nausea, vomiting, or diarrhea.  You have any major changes in your diet or you plan to make any major changes in your diet.  You start or stop any over-the-counter medicine, prescription medicine, herbal supplement, or dietary supplement.  You become pregnant, plan to become pregnant, or think you may be pregnant.  You have menstrual periods that are heavier than usual.  You have unusual bruising. Get help right away if you:  Have signs of an allergic reaction, such as: ? Swelling of the lips, face, tongue, mouth, or throat. ? Rash or itchy, red, swollen areas of skin (hives). ? Trouble breathing. ? Chest tightness.  Fall or have an accident, especially if you hit your head.  Have signs that your blood is too thin, such as: ? Blood in your urine. Your urine may look reddish, pinkish, or tea-colored. ? Blood in your stool. Your stool may be black or bright red. ? You vomit blood or cough up blood. The blood may be bright red, or it may look like coffee grounds. ? Bleeding that does not stop after applying pressure to the area for 30 minutes.  Have signs of a blood clot in your leg or arm, such as: ? Pain or swelling in  your leg or arm. ? Skin that is red or warm to the touch on your arm or leg.  Have signs of blood in your lung, such as: ? Shortness of breath or difficulty breathing. ? Chest pain. ? Unexplained fever.  Have any symptoms of a stroke. "BE FAST" is an easy way to remember the main warning signs of a stroke: ?  B - Balance. Signs are dizziness, sudden trouble walking, or loss of balance. ? E - Eyes. Signs are trouble seeing or a sudden change in vision. ? F - Face. Signs are sudden weakness or numbness of the face, or the face or eyelid drooping on one side. ? A - Arms. Signs are weakness or numbness in an arm. This happens suddenly and usually on one side of the body. ? S - Speech. Signs are sudden trouble speaking, slurred speech, or trouble understanding what people say. ? T - Time. Time to call emergency services. Write down what time symptoms started.  Have other signs of a stroke, such as: ? A sudden, severe headache with no known cause. ? Nausea or vomiting. ? Seizure.  Have other signs of a reaction to warfarin, such as: ? Purple or blue toes. ? Skin ulcers that do not go away. These symptoms may represent a serious problem that is an emergency. Do not wait to see if the symptoms will go away. Get medical help right away. Call your local emergency services (911 in the [place] Do not drive yourself to the hospital. Summary  Warfarin is a medicine that thins blood. It is used to prevent or treat blood clots. You must be monitored closely while on this medicine. Keep all follow-up visits.  Make sure that you know your target INR range and your warfarin dosage.  Wear or carry identification that says that you are taking warfarin.  Take warfarin at the same time every day. Call your health care provider if you miss a dose or if you take an extra dose. Do not change the dosage of warfarin on your own.  Know the signs and symptoms of blood clots, bleeding, and a stroke. Know when to  get emergency medical help. This information is not intended to replace advice given to you by your health care provider. Make sure you discuss any questions you have with your health care provider. Document Revised: 03/30/2020 Document Reviewed: 05/17/2019 Elsevier Patient Education  2021 [employer]   Information on my medicine - Coumadin   (Warfarin)   Why was Coumadin prescribed for you? Coumadin was prescribed for you because you have a blood clot or a medical condition that can cause an increased risk of forming blood clots. Blood clots can cause serious health problems by blocking the flow of blood to the heart, lung, or brain. Coumadin can prevent harmful blood clots from forming. As a reminder your indication for Coumadin is:   Blood Clot Prevention After Heart Valve Surgery  What test will check on my response to Coumadin? While on Coumadin (warfarin) you will need to have an INR test regularly to ensure that your dose is keeping you in the desired range. The INR (international normalized ratio) number is calculated from the result of the laboratory test called prothrombin time (PT).  If an INR APPOINTMENT HAS NOT ALREADY BEEN MADE FOR YOU please schedule an appointment to have this lab work done by your health care provider within 7 days. Your INR goal is usually a number between:  2 to 3 or your provider may give you a more narrow range like 2-2.5.  Ask your health care provider during an office visit what your goal INR is.  What  do you need to  know  About  COUMADIN? Take Coumadin (warfarin) exactly as prescribed by your healthcare provider about the same time each day.  DO NOT stop taking without talking to the  doctor who prescribed the medication.  Stopping without other blood clot prevention medication to take the place of Coumadin may increase your risk of developing a new clot or stroke.  Get refills before you run out.  What do you do if you miss a dose? If you miss a  dose, take it as soon as you remember on the same day then continue your regularly scheduled regimen the next day.  Do not take two doses of Coumadin at the same time.  Important Safety Information A possible side effect of Coumadin (Warfarin) is an increased risk of bleeding. You should call your healthcare provider right away if you experience any of the following: ? Bleeding from an injury or your nose that does not stop. ? Unusual colored urine (red or dark brown) or unusual colored stools (red or black). ? Unusual bruising for unknown reasons. ? A serious fall or if you hit your head (even if there is no bleeding).  Some foods or medicines interact with Coumadin (warfarin) and might alter your response to warfarin. To help avoid this: ? Eat a balanced diet, maintaining a consistent amount of Vitamin K. ? Notify your provider about major diet changes you plan to make. ? Avoid alcohol or limit your intake to 1 drink for women and 2 drinks for men per day. (1 drink is 5 oz. wine, 12 oz. beer, or 1.5 oz. liquor.)  Make sure that ANY health care provider who prescribes medication for you knows that you are taking Coumadin (warfarin).  Also make sure the healthcare provider who is monitoring your Coumadin knows when you have started a new medication including herbals and non-prescription products.  Coumadin (Warfarin)  Major Drug Interactions  Increased Warfarin Effect Decreased Warfarin Effect  Alcohol (large quantities) Antibiotics (esp. Septra/Bactrim, Flagyl, Cipro) Amiodarone (Cordarone) Aspirin (ASA) Cimetidine (Tagamet) Megestrol (Megace) NSAIDs (ibuprofen, naproxen, etc.) Piroxicam (Feldene) Propafenone (Rythmol SR) Propranolol (Inderal) Isoniazid (INH) Posaconazole (Noxafil) Barbiturates (Phenobarbital) Carbamazepine (Tegretol) Chlordiazepoxide (Librium) Cholestyramine (Questran) Griseofulvin Oral Contraceptives Rifampin Sucralfate (Carafate) Vitamin K   Coumadin  (Warfarin) Major Herbal Interactions  Increased Warfarin Effect Decreased Warfarin Effect  Garlic Ginseng Ginkgo biloba Coenzyme Q10 Green tea St. John's wort    Coumadin (Warfarin) FOOD Interactions  Eat a consistent number of servings per week of foods HIGH in Vitamin K (1 serving =  cup)  Collards (cooked, or boiled & drained) Kale (cooked, or boiled & drained) Mustard greens (cooked, or boiled & drained) Parsley *serving size only =  cup Spinach (cooked, or boiled & drained) Swiss chard (cooked, or boiled & drained) Turnip greens (cooked, or boiled & drained)  Eat a consistent number of servings per week of foods MEDIUM-HIGH in Vitamin K (1 serving = 1 cup)  Asparagus (cooked, or boiled & drained) Broccoli (cooked, boiled & drained, or raw & chopped) Brussel sprouts (cooked, or boiled & drained) *serving size only =  cup Lettuce, raw (green leaf, endive, romaine) Spinach, raw Turnip greens, raw & chopped   These websites have more information on Coumadin (warfarin):  http://www.king-russell.com/; https://www.hines.net/;

## 2020-12-17 NOTE — Progress Notes (Addendum)
      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  Primary complaint is inability to sleep.  No SOB.  Eating breakfast.  No pain at present  Objective: Vital signs: BP Readings from Last 1 Encounters:  12/17/20 124/72   Pulse Readings from Last 1 Encounters:  12/17/20 73   Resp Readings from Last 1 Encounters:  12/16/20 18   Temp Readings from Last 1 Encounters:  12/17/20 98 F (36.7 C) (Oral)    Hemodynamics: CVP:  [10 mmHg-22 mmHg] 19 mmHg  Physical Exam:  Rhythm:   Sinus 70's  Breath sounds: clear  Heart sounds:  RRR w/ systolic murmur LLSB  Incisions:  Dressings dry, intact  Abdomen:  Soft, non-distended, non-tender  Extremities:  Warm, well-perfused  Chest tubes:  decreasing volume thin serosanguinous output, no air leak    Intake/Output from previous day: 05/18 0701 - 05/19 0700 In: 1964.3 [P.O.:1700; I.V.:64.3; IV Piggyback:200.1] Out: 1720 [Urine:1190; Chest Tube:530] Intake/Output this shift: No intake/output data recorded.  Lab Results:  CBC: Recent Labs    12/16/20 1707 12/17/20 0434  WBC 15.5* 11.5*  HGB 10.9* 10.4*  HCT 32.0* 29.8*  PLT 220 171    BMET:  Recent Labs    12/16/20 1707 12/17/20 0434  NA 123* 123*  K 4.0 4.0  CL 94* 93*  CO2 23 26  GLUCOSE 173* 119*  BUN 9 9  CREATININE 0.81 0.69  CALCIUM 8.1* 8.1*     PT/INR:   Recent Labs    12/17/20 0434  LABPROT 15.5*  INR 1.2    CBG (last 3)  Recent Labs    12/16/20 1938 12/17/20 0001 12/17/20 0350  GLUCAP 110* 111* 96    ABG    Component Value Date/Time   PHART 7.287 (L) 12/15/2020 1445   PCO2ART 51.6 (H) 12/15/2020 1445   PO2ART 158 (H) 12/15/2020 1445   HCO3 24.7 12/15/2020 1445   TCO2 26  12/15/2020 1445   ACIDBASEDEF 2.0 12/15/2020 1445   O2SAT 99.0 12/15/2020 1445    CXR: clear  Assessment/Plan: S/P Procedure(s) (LRB): 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) TRANSESOPHAGEAL ECHOCARDIOGRAM (TEE) (N/A)  Overall doing well POD2 Maintaining NSR w/ stable BP Breathing comfortably w/ O2 sats 95% on room air Systolic murmur at LLSB suspicious for dynamic LVOTO but no murmur at apex - possible SAM  Expected post op volume excess, weight reportedly 6 kg > preop, UOP adequate Expected post op acute blood loss anemia, very mild   Start metoprolol and gradually increase to keep HR approx 60/min  D/C pacing wires  No diuretics - let patient diurese spontaneously  Leave chest tubes one more day  Check f/u ECHO to r/o [facility]  Mobilize  Transfer 4E  Purcell Nails, MD 12/17/2020 7:32 AM

## 2020-12-17 NOTE — Progress Notes (Signed)
  Echocardiogram 2D Echocardiogram has been performed.  Janalyn Harder 12/17/2020, 2:13 PM

## 2020-12-17 NOTE — Progress Notes (Signed)
Report called to Rn 4e01. Patient given PRN pain medications prior to transport per his request. Will transport via WC on monitor with RN.

## 2020-12-17 NOTE — Progress Notes (Signed)
Epicardial wires pulled, confirmed by Kenney Houseman Rn at bedside to be intact. Patient tolerated well. Central line and foley discontinued as well per order. Patient with no complaints at this time. VS as recorded.

## 2020-12-17 NOTE — Progress Notes (Signed)
Pt arrived to rm 1 from [facility] with chest tube -20cm suction. Initiated tele. CHG wipe performed. Oriented pt to the unit. VSS. Call bell within reach .  Lawson Radar, RN

## 2020-12-17 NOTE — Discharge Summary (Signed)
[address]       [place] [address]             [phone]    Physician Discharge Summary  Patient ID: Perry Lewis MRN: [id number] DOB/AGE: [birth year] 56 y.o.  Admit date: 12/15/2020 Discharge date: 12/19/2020  Admission Diagnoses:  Patient Active Problem List   Diagnosis Date Noted  . Mitral regurgitation   . Rheumatoid arthritis (HCC)   . Acute appendicitis s/p lap appendectomy 11/28/2018 11/28/2018  . Mitral valve prolapse    Discharge Diagnoses:  Patient Active Problem List   Diagnosis Date Noted  . S/P minimally invasive mitral valve repair 12/15/2020  . Mitral regurgitation   . Rheumatoid arthritis (HCC)   . Acute appendicitis s/p lap appendectomy 11/28/2018 11/28/2018  . Mitral valve prolapse    Discharged Condition: good  History of Present Illness:  Patient is a 55 year old male with history of mitral valve prolapse, mitral regurgitation and rheumatoid arthritis who has been referred for surgical consultation to discuss treatment options for management of severe primary mitral regurgitation.   Patient states that he was first told he had a heart murmur in 2017.  At the time he lived in [place] and echocardiogram performed there revealed mitral valve prolapse with what was felt to be moderate mitral regurgitation and normal left ventricular systolic function.  Shortly after that he moved to [facility] where he has established health care follow-up with Dr. Cyndia Bent and Dr. Lenon Curt.  Follow-up transthoracic echocardiograms have documented the presence of mitral valve prolapse with mitral regurgitation that has progressed in severity.  Most recent follow-up echocardiogram performed May 19, 2020 revealed severe prolapse involving the middle scallop of the posterior leaflet causing moderate to severe mitral regurgitation.  Left ventricular systolic function remain normal.  Patient was referred for elective surgical consultation.   Patient  is single and lives alone locally in [place].  He is accompanied by his longtime partner who is also the mother of his daughter for his office consultation visit today.  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  He does not exercise at all on a regular basis but he reports no specific physical limitations.  Within the last few years he has been diagnosed with rheumatoid arthritis for which she has been taking methotrexate for the past year.  This is brought symptoms of arthritis under reasonably good control.  When the patient is not working he does enjoy working around his house and he recently built a Product manager.  He denies any history of symptoms of exertional shortness of breath or chest discomfort.  He has not had any resting shortness of breath, PND, orthopnea, dizzy spells, or syncope.  He reports frequent palpitations that are self-limited.  He has some swelling involving his right lower leg in association with varicose veins.  He does not experience any lower extremity edema on the left side.   Patient was initially seen in consultation on September 28, 2020.  Since then he was evaluated by Dr. Bufford Buttner who performed transesophageal echocardiogram on October 26, 2020.  TEE confirmed the presence of mitral valve prolapse with a very large prolapsing segment involving the majority of the middle scallop (P2) of the posterior leaflet causing severe (4+) mitral regurgitation.  Regurgitant volume was calculated greater than 100 mL with regurgitant fraction 64% and systolic flow reversal in 3 out of 4 pulmonary veins.  Left ventricular systolic function remains  normal with ejection fraction 60 to 65%.  There was moderate left atrial enlargement.  No other significant abnormalities were noted.  The patient underwent gated coronary CT angiogram which revealed normal coronary artery anatomy with no significant coronary artery disease and calcium score of 0.   Patient returns to the office today for follow-up to discuss treatment options further.  He continues to feel well and he specifically denies any symptoms of exertional shortness of breath.  He is eager to proceed with elective surgical repair in the near future.  Hospital Course:  Mr. Nesheiwat presented to [facility] 12/15/2020.  He was taken to the operating room and underwent Minimally Invasive Mitral Valve Repair with ring annuloplasty and placement of 10 Gore-tex Neochords.  He tolerated the procedure without difficulty, was extubated, and taken to the SICU in stable condition.  He was weaned off Neo-synephrine as his blood pressure allowed.  On physical exam he was noted to have a murmur concerning for SAM.  He was initiated on beta blocker therapy for this.  Echocardiogram was obtained and confirmed the presence of SAM which was mild.  He was initiated on coumadin for his MV Repair.  He was maintaining NSR and his pacing wires were removed without difficulty.  He was medically stable for transfer to the progressive care unit on 12/17/2020.  His chest tube output decreased and his chest tubes were removed on 12/18/2020.  Follow up CXR showed no complicating features.  He remains on coumadin with most recent INR of 1.4.  He will be discharged home on  of coumadin daily.  He is ambulating independently.  His surgical incisions are healing without evidence of infection.  He is medically stable for discharge home today.    Significant Diagnostic Studies: cardiac graphics:   Echocardiogram: (Pre-operative)  IMPRESSIONS    1. There is severe prolapse of the entire posterior mitral valve leaflet  with severe eccentric mitral regurgitation. There is no flail chord. 2D  PISA radius 2.6 cm, 2D ERO (angle correction 57) 1.02 cm2, R vol 153 cc.  By continuity R vol 114 cc, RF 64%.  There is systolic flow reversal in 3/4 pulmonary veins. Overall, severe  mitral regurgitation is present. LVEF is  60%. The mitral valve is  myxomatous. Severe mitral valve regurgitation. No evidence of mitral  stenosis.  2. Left ventricular ejection fraction, by estimation, is 60 to 65%. Left  ventricular ejection fraction by 3D volume is 60 %. The left ventricle has  normal function. The left ventricle has no regional wall motion  abnormalities.  3. Right ventricular systolic function is normal. The right ventricular  size is normal.  4. Left atrial size was mild to moderately dilated. No left atrial/left  atrial appendage thrombus was detected. The LAA emptying velocity was 51  cm/s.  5. The aortic valve is tricuspid. Aortic valve regurgitation is not  visualized. No aortic stenosis is present.  6. Agitated saline contrast bubble study was negative, with no evidence  of any interatrial shunt.   Echocardiogram: (Post operative)  IMPRESSIONS    1. Left ventricular ejection fraction, by estimation, is 50 to 55%. The  left ventricle has low normal function. The left ventricle has no regional  wall motion abnormalities.  2. Right ventricular systolic function is normal. The right ventricular  size is normal. There is mildly elevated pulmonary artery systolic  pressure. The estimated right ventricular systolic pressure is 38.8 mmHg.  3. Left atrial size was mildly dilated.  4. The mitral leaflets are redundant and myxomatous. The annuloplasty  ring is well seated. There is mild systolic anterior motion of the  anterior mitral leaflet. At rest there is minimal obstruction in the LV  outflow tract (peak gradient 19, mean  gradient 9 mm Hg). This becomes moderate with the Valsalva maneuver (peak  gradient 40, mean gradient 15 mm Hg).. The mitral valve has been  repaired/replaced. Mild mitral valve regurgitation. The mean mitral valve  gradient is 3.0 mmHg. There is a 96mm  Medtronic prosthetic annuloplasty ring present in the mitral position.  Echo findings are consistent with normal  structure and function of the  mitral valve prosthesis.  5. The aortic valve is tricuspid. Aortic valve regurgitation is not  visualized. No aortic stenosis is present.  6. The inferior vena cava is dilated in size with <50% respiratory  variability, suggesting right atrial pressure of 15 mmHg.   Treatments: surgery:   Date of Procedure:                12/15/2020  Preoperative Diagnosis:      Severe Mitral Regurgitation  Postoperative Diagnosis:    Same  Procedure:        Minimally-Invasive Mitral Valve Repair             Complex valvuloplasty including artificial Gore-tex neochord placement x10             Sorin Memo 4D Ring Annuloplasty (size 71mm, model # [id number], serial # [id number])               Surgeon:        Salvatore Decent. Cornelius Moras, MD  Assistant:       Lowella Dandy, PA-C   Discharge Exam: Blood pressure 117/64, pulse 76, temperature 98.8 F (37.1 C), temperature source Oral, resp. rate 20, height 5\' 9"  (1.753 m), weight 77.1 kg, SpO2 97 %.  General appearance: alert, cooperative and no distress Heart: regular rate and rhythm and + murmur Lungs: clear to auscultation bilaterally, CXR shows clear lung fields, mild basilar ATX. Abdomen: soft, non-tender Extremities: no edema Wound: clean and dry   Discharge Medications:  The patient has been discharged on:   1.Beta Blocker:  Yes [ x  ]                              No   [   ]                              If No, reason:  2.Ace Inhibitor/ARB: Yes [   ]                                     No  [ x   ]                                     If No, reason:BP soft  3.Statin:   Yes [   ]                  No  [  x ]                  If No, reason: no CAD, no elevated  Lipids  4.Marlowe KaysCarlyle Basques   ]                  No   [   ]                  If No, reason:     Allergies as of 12/19/2020   No Known Allergies     Medication List    STOP taking these medications   multivitamin with minerals Tabs tablet      TAKE these medications   acetaminophen 500 MG tablet Commonly known as: TYLENOL Take 2 tablets (1,000 mg total) by mouth every 6 (six) hours as needed.   aspirin 81 MG EC tablet Take 1 tablet (81 mg total) by mouth daily. Swallow whole.   folic acid 1 MG tablet Commonly known as: FOLVITE Take 2 mg by mouth daily.   methotrexate 2.5 MG tablet Commonly known as: RHEUMATREX Take 15 mg by mouth every Tuesday. Caution:Chemotherapy. Protect from light.   metoprolol tartrate 25 MG tablet Commonly known as: LOPRESSOR Take 1 tablet (25 mg total) by mouth 2 (two) times daily.   traMADol 50 MG tablet Commonly known as: ULTRAM Take 1 tablet (50 mg total) by mouth every 6 (six) hours as needed for up to 5 days for moderate pain.   warfarin 5 MG tablet Commonly known as: COUMADIN Take 1 tablet (5 mg total) by mouth daily at 4 PM.       Follow-up Information    Purcell Nails, MD.   Specialty: Cardiothoracic Surgery Contact information: [address] [place] [address] [place] [address] [phone]        [facility] [place] Follow up on 12/25/2020.   Specialty: Cardiothoracic Surgery Why: Appointment is at 11:00 with nurse for suture removal Contact information: [address] [place], [address] [place] [address] [phone]       Sande Rives, MD Follow up on 12/23/2020.   Specialties: Cardiology, Internal Medicine, Radiology Why: Appointment is at 10:30 for PT/INR check, you will not see the Physician Contact information: [address] [place] [address] [phone]        Ronney Asters, NP. Go on 01/05/2021.   Specialty: Cardiology Why: Your appointment is at 11:15am Contact information: [address] [place] [address] [phone]               Signed: Leary Roca, PA-C  12/19/2020, 8:29 AM

## 2020-12-17 NOTE — Anesthesia Postprocedure Evaluation (Signed)
Anesthesia Post Note  Patient: Perry Lewis  Procedure(s) Performed: 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 Chest) TRANSESOPHAGEAL ECHOCARDIOGRAM (TEE) (N/A )     Patient location during evaluation: SICU Anesthesia Type: General Level of consciousness: sedated Pain management: pain level controlled Vital Signs Assessment: post-procedure vital signs reviewed and stable Respiratory status: patient remains intubated per anesthesia plan Cardiovascular status: stable Postop Assessment: no apparent nausea or vomiting Anesthetic complications: no   No complications documented.  Last Vitals:  Vitals:   12/17/20 0900 12/17/20 0915  BP: 114/77 108/73  Pulse: 73 72  Resp:    Temp:    SpO2: 92% 93%    Last Pain:  Vitals:   12/17/20 0751  TempSrc: Oral  PainSc:                  Nelle Don Alliene Klugh

## 2020-12-18 LAB — CBC
HCT: 29 % — ABNORMAL LOW (ref 39.0–52.0)
Hemoglobin: 10.3 g/dL — ABNORMAL LOW (ref 13.0–17.0)
MCH: 32 pg (ref 26.0–34.0)
MCHC: 35.5 g/dL (ref 30.0–36.0)
MCV: 90.1 fL (ref 80.0–100.0)
Platelets: 193 10*3/uL (ref 150–400)
RBC: 3.22 MIL/uL — ABNORMAL LOW (ref 4.22–5.81)
RDW: 12.9 % (ref 11.5–15.5)
WBC: 11 10*3/uL — ABNORMAL HIGH (ref 4.0–10.5)
nRBC: 0 % (ref 0.0–0.2)

## 2020-12-18 LAB — BASIC METABOLIC PANEL
Anion gap: 6 (ref 5–15)
BUN: 7 mg/dL (ref 6–20)
CO2: 28 mmol/L (ref 22–32)
Calcium: 8.5 mg/dL — ABNORMAL LOW (ref 8.9–10.3)
Chloride: 93 mmol/L — ABNORMAL LOW (ref 98–111)
Creatinine, Ser: 0.55 mg/dL — ABNORMAL LOW (ref 0.61–1.24)
GFR, Estimated: >60 mL/min (ref >60)
Glucose, Bld: 112 mg/dL — ABNORMAL HIGH (ref 70–99)
Potassium: 4.2 mmol/L (ref 3.5–5.1)
Sodium: 127 mmol/L — ABNORMAL LOW (ref 135–145)

## 2020-12-18 LAB — PROTIME-INR
INR: 1.3 — ABNORMAL HIGH (ref 0.8–1.2)
Prothrombin Time: 15.9 seconds — ABNORMAL HIGH (ref 11.4–15.2)

## 2020-12-18 MED ORDER — WARFARIN SODIUM 5 MG PO TABS
5.0000 mg | ORAL_TABLET | Freq: Every day | ORAL | Status: DC
  Filled 2020-12-18: qty 1

## 2020-12-18 MED ORDER — LACTULOSE 10 GM/15ML PO SOLN
20.0000 g | Freq: Every day | ORAL | Status: DC | PRN
  Administered 2020-12-18: 20 g via ORAL
  Filled 2020-12-18: qty 30

## 2020-12-18 MED ORDER — KETOROLAC TROMETHAMINE 30 MG/ML IJ SOLN: 30.0000 mg | Freq: Four times a day (QID) | INTRAMUSCULAR | Status: DC | PRN

## 2020-12-18 MED ORDER — METOPROLOL TARTRATE 25 MG PO TABS
25.0000 mg | ORAL_TABLET | Freq: Two times a day (BID) | ORAL | Status: DC
  Administered 2020-12-18 – 2020-12-19 (×3): 25 mg via ORAL
  Filled 2020-12-18 (×3): qty 1

## 2020-12-18 MED FILL — Heparin Sodium (Porcine) Inj 1000 Unit/ML: INTRAMUSCULAR | Qty: 10 | Status: AC

## 2020-12-18 MED FILL — Sodium Chloride IV Soln 0.9%: INTRAVENOUS | Qty: 3000 | Status: AC

## 2020-12-18 MED FILL — Albumin, Human Inj 5%: INTRAVENOUS | Qty: 250 | Status: AC

## 2020-12-18 MED FILL — Electrolyte-R (PH 7.4) Solution: INTRAVENOUS | Qty: 4000 | Status: AC

## 2020-12-18 MED FILL — Sodium Bicarbonate IV Soln 8.4%: INTRAVENOUS | Qty: 50 | Status: AC

## 2020-12-18 NOTE — Progress Notes (Signed)
CARDIAC REHAB PHASE I   PRE:  Rate/Rhythm: 70 SR  BP:  Sitting:  116/82      SaO2: 98 RA  MODE:  Ambulation: 470 ft   POST:  Rate/Rhythm: 74 SR  BP:  Sitting: 123/80    SaO2: 99 RA   Pt ambulated 451ft in hallway standby assist with front wheel walker. Pt denies CP, SOB, or dizziness. Pt returned to recliner. Encouraged continued ambulation. To get CT d/c'd can walk independently.  [id number] Reynold Bowen, RN BSN 12/18/2020 11:51 AM

## 2020-12-18 NOTE — Progress Notes (Signed)
Right CT removed per order.  Pt tolerated very well.  Occlusive dressing applied, Pt understands bedrest for 30 mins with cont pulse ox monitoring.  Will cont plan of care

## 2020-12-18 NOTE — Progress Notes (Signed)
Mobility Specialist - Progress Note   12/18/20 1418  Mobility  Activity Ambulated in hall  Level of Assistance Independent  Assistive Device None  Distance Ambulated (ft) 470 ft  Mobility Ambulated with assistance in hallway  Mobility Response Tolerated well  Mobility performed by Mobility specialist  $Mobility charge 1 Mobility   Pt asx throughout ambulation. Pt back in room after walk. VSS throughout.  Mamie Levers Mobility Specialist Mobility Specialist Phone: [phone]

## 2020-12-18 NOTE — Progress Notes (Addendum)
      [address]       [place] [address]             [phone]      3 Days Post-Op Procedure(s) (LRB): 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) TRANSESOPHAGEAL ECHOCARDIOGRAM (TEE) (N/A)   Subjective:  Patient doing okay.  Got some sleep last night.  Hasn't moved his bowels.  Pain is mostly controlled.  Denies N/V  Objective: Vital signs in last 24 hours: Temp:  [97.9 F (36.6 C)-98.4 F (36.9 C)] 98.4 F (36.9 C) (05/20 0345) Pulse Rate:  [68-75] 68 (05/20 0345) Cardiac Rhythm: Normal sinus rhythm (05/19 1900) Resp:  [16-18] 16 (05/20 0345) BP: (103-141)/(73-90) 110/77 (05/20 0345) SpO2:  [92 %-100 %] 94 % (05/20 0345) Weight:  [78.3 kg] 78.3 kg (05/20 0643)  Intake/Output from previous day: 05/19 0701 - 05/20 0700 In: 580 [P.O.:480; IV Piggyback:100] Out: 4150 [Urine:4000; Chest Tube:150] Intake/Output this shift: Total I/O In: -  Out: 60 [Chest Tube:60]  General appearance: alert, cooperative and no distress Heart: regular rate and rhythm and + murmur Lungs: clear to auscultation bilaterally Abdomen: soft, non-tender; bowel sounds normal; no masses,  no organomegaly Extremities: edema trace Wound: clean and dry  Lab Results: Recent Labs    12/17/20 0434 12/18/20 0128  WBC 11.5* 11.0*  HGB 10.4* 10.3*  HCT 29.8* 29.0*  PLT 171 193   BMET:  Recent Labs    12/17/20 0434 12/18/20 0128  NA 123* 127*  K 4.0 4.2  CL 93* 93*  CO2 26 28  GLUCOSE 119* 112*  BUN 9 7  CREATININE 0.69 0.55*  CALCIUM 8.1* 8.5*    PT/INR:  Recent Labs    12/18/20 0128  LABPROT 15.9*  INR 1.3*   ABG    Component Value Date/Time   PHART 7.287 (L) 12/15/2020 1445   HCO3 24.7 12/15/2020 1445   TCO2 26 12/15/2020 1445   ACIDBASEDEF 2.0 12/15/2020 1445   O2SAT 99.0 12/15/2020 1445   CBG (last 3)  Recent Labs    12/17/20 0749 12/17/20 1132 12/17/20 1545  GLUCAP 153* 117* 102*    Assessment/Plan: S/P Procedure(s)  (LRB): 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) TRANSESOPHAGEAL ECHOCARDIOGRAM (TEE) (N/A)  1. CV- NSR, BP elevated at times- Lopressor titrated to 25 mg BID this morning, ECHO obtained yesterday, confirmed SAM which was mild 2. INR 1.3, will increase coumadin to 5 mg daily 3. Pulm-no acute issues, CT output 150 cc, no air leak- will d/c chest tubes today, get 2V in AM 4. Renal- creatinine WNL, weight is elevated- allow spontaneous diuresis, no Lasix at this time 5. Expected post operative blood loss anemia- mild at 10.3 6. Dispo- patient stable, lopressor titrated to 25 mg BID, SAM confirmed on Echocardiogram, which is mild and source of murmur,  increase coumadin, no lasix at this time, get 2V in AM, suspect patient will be ready for d/c on Sunday if remains stable   LOS: 3 days    Lowella Dandy, PA-C 12/18/2020   I have seen and examined the patient and agree with the assessment and plan as outlined.  Doing well.  Follow up ECHO looks good w/ intact mitral valve repair, mild chordal SAM and trivial LVOT gradient which explains the systolic murmur on exam.  Increase metoprolol 25 bid.  Mobilize.  D/C tubes today.  Anticipate likely D/C home 1-2 days. Purcell Nails, MD 12/18/2020

## 2020-12-19 ENCOUNTER — Inpatient Hospital Stay (HOSPITAL_COMMUNITY): Payer: BC Managed Care – PPO

## 2020-12-19 LAB — PROTIME-INR
INR: 1.4 — ABNORMAL HIGH (ref 0.8–1.2)
Prothrombin Time: 16.7 seconds — ABNORMAL HIGH (ref 11.4–15.2)

## 2020-12-19 MED ORDER — ASPIRIN 81 MG PO TBEC: 81.0000 mg | DELAYED_RELEASE_TABLET | Freq: Every day | ORAL | 11 refills | Status: AC

## 2020-12-19 MED ORDER — TRAMADOL HCL 50 MG PO TABS: 50.0000 mg | ORAL_TABLET | Freq: Four times a day (QID) | ORAL | 0 refills | Status: AC | PRN

## 2020-12-19 MED ORDER — ACETAMINOPHEN 500 MG PO TABS: 1000.0000 mg | ORAL_TABLET | Freq: Four times a day (QID) | ORAL | 0 refills | Status: DC | PRN

## 2020-12-19 MED ORDER — METOPROLOL TARTRATE 25 MG PO TABS: 25.0000 mg | ORAL_TABLET | Freq: Two times a day (BID) | ORAL | 2 refills | Status: DC

## 2020-12-19 MED ORDER — WARFARIN SODIUM 5 MG PO TABS: 5.0000 mg | ORAL_TABLET | Freq: Every day | ORAL | 2 refills | Status: DC

## 2020-12-19 NOTE — Progress Notes (Signed)
Patient transported by w/c to [facility] by NT.  AVS given all questions addressed.  PIV removed, telemetry d/c, with telemetry box returned to the unit.

## 2020-12-19 NOTE — Progress Notes (Signed)
      [address]       [name] [address]             [phone]      3 Days Post-Op Procedure(s) (LRB): 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) TRANSESOPHAGEAL ECHOCARDIOGRAM (TEE) (N/A)   Subjective:  Doing well. Rested better, pain controlled.  Remains on RA. Near baseline Wt.   Objective: Vital signs in last 24 hours: Temp:  [97.5 F (36.4 C)-98.8 F (37.1 C)] 98.8 F (37.1 C) (05/21 0717) Pulse Rate:  [70-80] 76 (05/21 0717) Cardiac Rhythm: Normal sinus rhythm (05/21 0700) Resp:  [14-20] 20 (05/21 0717) BP: (106-144)/(64-82) 117/64 (05/21 0717) SpO2:  [94 %-99 %] 97 % (05/21 0717) Weight:  [77.1 kg] 77.1 kg (05/21 0325)  Intake/Output from previous day: 05/20 0701 - 05/21 0700 In: 480 [P.O.:480] Out: 2110 [Urine:2050; Chest Tube:60] Intake/Output this shift: No intake/output data recorded.  General appearance: alert, cooperative and no distress Heart: regular rate and rhythm and + murmur Lungs: clear to auscultation bilaterally, CXR shows clear lung fields, mild basilar ATX. Abdomen: soft, non-tender Extremities: no edema Wound: clean and dry  Lab Results: Recent Labs    12/17/20 0434 12/18/20 0128  WBC 11.5* 11.0*  HGB 10.4* 10.3*  HCT 29.8* 29.0*  PLT 171 193   BMET:  Recent Labs    12/17/20 0434 12/18/20 0128  NA 123* 127*  K 4.0 4.2  CL 93* 93*  CO2 26 28  GLUCOSE 119* 112*  BUN 9 7  CREATININE 0.69 0.55*  CALCIUM 8.1* 8.5*    PT/INR:  Recent Labs    12/19/20 0100  LABPROT 16.7*  INR 1.4*   ABG    Component Value Date/Time   PHART 7.287 (L) 12/15/2020 1445   HCO3 24.7 12/15/2020 1445   TCO2 26 12/15/2020 1445   ACIDBASEDEF 2.0 12/15/2020 1445   O2SAT 99.0 12/15/2020 1445   CBG (last 3)  Recent Labs    12/17/20 0749 12/17/20 1132 12/17/20 1545  GLUCAP 153* 117* 102*    Assessment/Plan: S/P Procedure(s) (LRB): 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) TRANSESOPHAGEAL  ECHOCARDIOGRAM (TEE) (N/A)  1. CV- NSR, BP control is better. ECHO obtained post-op confirmed SAM which was mild 2. INR 1.4, continue Coumadin  5 mg daily 3. Pulm-no acute issues, CXR OK. 4. Renal- creatinine WN  5. Expected post operative blood loss anemia- has been mild and stable (no new lab today) 6. Dispo- plan discharge later today.   LOS: 4 days    Leary Roca, [place]  [phone] 12/19/2020

## 2020-12-19 NOTE — Progress Notes (Signed)
CARDIAC REHAB PHASE I   PRE:  Rate/Rhythm: 77SR Rare PVC's  BP:  Supine:   Sitting: 124/77  Standing:    SaO2: 99 RA  MODE:  Ambulation: 940 ft   POST:  Rate/Rhythm: 80 SR  BP:  Supine:   Sitting: 128/78  Standing:    SaO2: 100 RA   Pt tolerated exercise well and AMB 940 ft with standby assist and no assistive device. Pt had no CP, SOB or pain during walk. Education given on heart healthy diet, incision site care and restrictions, medication adherence, and home exercise guidelines. Will refer to CRP2 at [facility]. Pt seems very interested in the program. His daughter will be staying with him upon d/c for help around the home.  [id number] Perry Lewis ACSM-EP 12/19/2020 8:58 AM

## 2020-12-22 ENCOUNTER — Encounter: Payer: Self-pay | Admitting: Thoracic Surgery (Cardiothoracic Vascular Surgery)

## 2020-12-23 ENCOUNTER — Ambulatory Visit (INDEPENDENT_AMBULATORY_CARE_PROVIDER_SITE_OTHER): Payer: BC Managed Care – PPO

## 2020-12-23 ENCOUNTER — Other Ambulatory Visit: Payer: Self-pay

## 2020-12-23 DIAGNOSIS — Z9889 Other specified postprocedural states: Secondary | ICD-10-CM

## 2020-12-23 DIAGNOSIS — Z5181 Encounter for therapeutic drug level monitoring: Secondary | ICD-10-CM

## 2020-12-23 DIAGNOSIS — Z7901 Long term (current) use of anticoagulants: Secondary | ICD-10-CM | POA: Insufficient documentation

## 2020-12-23 LAB — POCT INR: INR: 1.5 — AB (ref 2.0–3.0)

## 2020-12-23 NOTE — Patient Instructions (Signed)
Take 2 tablets tonight only and then continue taking 1 tablet Daily.  INR in 1 week  A full discussion of the nature of anticoagulants has been carried out.  A benefit risk analysis has been presented to the patient, so that they understand the justification for choosing anticoagulation at this time. The need for frequent and regular monitoring, precise dosage adjustment and compliance is stressed.  Side effects of potential bleeding are discussed.  The patient should avoid any OTC items containing aspirin or ibuprofen, and should avoid great swings in general diet.  Avoid alcohol consumption.  Call if any signs of abnormal bleeding.  [phone]

## 2020-12-25 ENCOUNTER — Other Ambulatory Visit: Payer: Self-pay

## 2020-12-25 ENCOUNTER — Ambulatory Visit (INDEPENDENT_AMBULATORY_CARE_PROVIDER_SITE_OTHER): Payer: Self-pay | Admitting: *Deleted

## 2020-12-25 DIAGNOSIS — Z4802 Encounter for removal of sutures: Secondary | ICD-10-CM

## 2020-12-25 NOTE — Progress Notes (Signed)
Patient arrived for nurse visit to remove sutures post-Mini MVRepair 5/17 by Dr. Cornelius Moras.  Two sutures removed with no signs or symptoms of infection noted. Incisions well approximated.  Patient tolerated suture removal well.  Patient and family instructed to keep the incision site clean and dry. Patient and family acknowledged instructions given.  All questions answered.

## 2020-12-29 ENCOUNTER — Encounter: Payer: Self-pay | Admitting: Thoracic Surgery (Cardiothoracic Vascular Surgery)

## 2020-12-31 ENCOUNTER — Ambulatory Visit (INDEPENDENT_AMBULATORY_CARE_PROVIDER_SITE_OTHER): Payer: BC Managed Care – PPO | Admitting: Cardiology

## 2020-12-31 ENCOUNTER — Ambulatory Visit (INDEPENDENT_AMBULATORY_CARE_PROVIDER_SITE_OTHER): Payer: BC Managed Care – PPO

## 2020-12-31 ENCOUNTER — Other Ambulatory Visit: Payer: Self-pay

## 2020-12-31 ENCOUNTER — Other Ambulatory Visit: Payer: Self-pay | Admitting: *Deleted

## 2020-12-31 ENCOUNTER — Encounter: Payer: Self-pay | Admitting: Cardiology

## 2020-12-31 VITALS — BP 90/57 | HR 97 | Ht 69.0 in | Wt 163.8 lb

## 2020-12-31 DIAGNOSIS — Z5181 Encounter for therapeutic drug level monitoring: Secondary | ICD-10-CM

## 2020-12-31 DIAGNOSIS — I340 Nonrheumatic mitral (valve) insufficiency: Secondary | ICD-10-CM

## 2020-12-31 DIAGNOSIS — Z9889 Other specified postprocedural states: Secondary | ICD-10-CM

## 2020-12-31 DIAGNOSIS — I4891 Unspecified atrial fibrillation: Secondary | ICD-10-CM

## 2020-12-31 DIAGNOSIS — Z7901 Long term (current) use of anticoagulants: Secondary | ICD-10-CM

## 2020-12-31 LAB — POCT INR: INR: 1.9 — AB (ref 2.0–3.0)

## 2020-12-31 MED ORDER — AMIODARONE HCL 200 MG PO TABS: ORAL_TABLET | ORAL | 0 refills | Status: DC

## 2020-12-31 NOTE — Progress Notes (Signed)
Cardiology Office Note:    Date:  12/31/2020   ID:  Perry Lewis, DOB [birth year], MRN [id number]  PCP:  Eartha Inch, MD  Cardiologist:  None  Electrophysiologist:  None   Referring MD: Eartha Inch, MD   Chief Complaint  Patient presents with  . Atrial Fibrillation         History of Present Illness:    Perry Lewis is a 55 y.o. male with a hx of mitral valve prolapse with severe MR status post mitral valve repair on 12/15/2020.  He follows with Dr. Flora Lipps.  He presented for INR check in Coumadin clinic today and noted to be tachycardic.  EKG showed A. fib with RVR, rate 107.  New diagnosis.  He reports that he can tell when he goes into atrial fibrillation since his surgery, notes that he will have palpitations where he feels like his heart is beating irregularly and tachycardic.  He has been checking his BP and heart rate regularly at home since his surgery.  Reports heart rate is typically in the 70s to 90s, but when he is having the palpitations where he thinks he is in A. fib his BP monitor is unable to determine his pulse.  Reports BP has been typically about 130s, but is lower when he is in A. fib, down to 90s today.  Does report feeling lightheaded today.  He denies any chest pain, shortness of breath, syncope, or lower extremity edema.    Past Medical History:  Diagnosis Date  . Mitral regurgitation   . Mitral valve prolapse   . Rheumatoid arthritis (HCC)   . S/P minimally invasive mitral valve repair 12/15/2020   Complex valvuloplasty including artificial Gore-tex neochord placement x10 with 34 mm Medtronic Simuform ring annuloplasty via right mini thoracotomy approach    Past Surgical History:  Procedure Laterality Date  . BUBBLE STUDY  10/26/2020   Procedure: BUBBLE STUDY;  Surgeon: Sande Rives, MD;  Location: [facility] ENDOSCOPY;  Service: Cardiovascular;;  . HERNIA REPAIR    . LAPAROSCOPIC APPENDECTOMY N/A 11/27/2018   Procedure: APPENDECTOMY  LAPAROSCOPIC;  Surgeon: Gaynelle Adu, MD;  Location: [facility] ORS;  Service: General;  Laterality: N/A;  . MITRAL VALVE REPAIR Right 12/15/2020   Procedure: MINIMALLY INVASIVE MITRAL VALVE REPAIR (MVR);  Surgeon: Purcell Nails, MD;  Location: [facility] OR;  Service: Open Heart Surgery;  Laterality: Right;  . TEE WITHOUT CARDIOVERSION N/A 10/26/2020   Procedure: TRANSESOPHAGEAL ECHOCARDIOGRAM (TEE);  Surgeon: Sande Rives, MD;  Location: [facility] ENDOSCOPY;  Service: Cardiovascular;  Laterality: N/A;  . TEE WITHOUT CARDIOVERSION N/A 12/15/2020   Procedure: TRANSESOPHAGEAL ECHOCARDIOGRAM (TEE);  Surgeon: Purcell Nails, MD;  Location: [facility] OR;  Service: Open Heart Surgery;  Laterality: N/A;    Current Medications: Current Meds  Medication Sig  . acetaminophen (TYLENOL) 500 MG tablet Take 2 tablets (1,000 mg total) by mouth every 6 (six) hours as needed.  Marland Kitchen amiodarone (PACERONE) 200 MG tablet Take 200 mg (1 tablet) two times daily for 14 days, then decrease to 200 mg once daily  . aspirin EC 81 MG EC tablet Take 1 tablet (81 mg total) by mouth daily. Swallow whole.  . folic acid (FOLVITE) 1 MG tablet Take 2 mg by mouth daily.  . methotrexate (RHEUMATREX) 2.5 MG tablet Take 15 mg by mouth every Tuesday. Caution:Chemotherapy. Protect from light.  . metoprolol tartrate (LOPRESSOR) 25 MG tablet Take 1 tablet (25 mg total) by mouth 2 (two) times daily.  Marland Kitchen  warfarin (COUMADIN) 5 MG tablet Take 1 tablet (5 mg total) by mouth daily at 4 PM.     Allergies:   Patient has no known allergies.   Social History   Socioeconomic History  . Marital status: Single    Spouse name: Not on file  . Number of children: Not on file  . Years of education: Not on file  . Highest education level: Not on file  Occupational History  . Occupation: Adult nurse  Tobacco Use  . Smoking status: Former Games developer  . Smokeless tobacco: Never Used  Vaping Use  . Vaping Use: Never used  Substance and Sexual Activity  .  Alcohol use: Yes    Comment: 2x week  . Drug use: Never  . Sexual activity: Not on file  Other Topics Concern  . Not on file  Social History Narrative  . Not on file   Social Determinants of Health   Financial Resource Strain: Not on file  Food Insecurity: Not on file  Transportation Needs: Not on file  Physical Activity: Not on file  Stress: Not on file  Social Connections: Not on file     Family History: The patient's *family history is not on file.  ROS:   Please see the history of present illness.    All other systems reviewed and are negative.  EKGs/Labs/Other Studies Reviewed:    The following studies were reviewed today:   EKG:   12/31/2020- The ekg ordered today demonstrates Afib, rate 107, non specific T wave flattening    Recent Labs: 12/11/2020: ALT 35 12/16/2020: Magnesium 2.1 12/18/2020: BUN 7; Creatinine, Ser 0.55; Hemoglobin 10.3; Platelets 193; Potassium 4.2; Sodium 127  Recent Lipid Panel No results found for: CHOL, TRIG, HDL, CHOLHDL, VLDL, LDLCALC, LDLDIRECT  Physical Exam:    VS:  BP (!) 90/57 (BP Location: Left Arm, Patient Position: Sitting)   Pulse 97   Ht 5\' 9"  (1.753 m)   Wt 163 lb 12.8 oz (74.3 kg)   SpO2 97%   BMI 24.19 kg/m     Wt Readings from Last 3 Encounters:  12/31/20 163 lb 12.8 oz (74.3 kg)  12/19/20 169 lb 15.6 oz (77.1 kg)  12/14/20 170 lb (77.1 kg)     GEN:  in no acute distress HEENT: Normal NECK: No JVD CARDIAC: Irregular, borderline tachycardic, 2/6 systolic murmur RESPIRATORY:  Clear to auscultation without rales, wheezing or rhonchi  ABDOMEN: Soft, non-tender, non-distended MUSCULOSKELETAL:  No edema; No deformity  SKIN: Warm and dry NEUROLOGIC:  Alert and oriented x 3 PSYCHIATRIC:  Normal affect   ASSESSMENT:    1. Atrial fibrillation, unspecified type (HCC)   2. Mitral valve insufficiency, unspecified etiology   3. S/P mitral valve repair   4. Long term (current) use of anticoagulants    PLAN:     Atrial fibrillation: Presented for INR check today, found to be in A. fib with RVR, rate 100s.   -BP is soft (90/57), suspect does not tolerate A. fib well due to worsening of LVOT obstruction (noted on echo post MVR) when in A. fib with RVR.  He appears to be going in and out of A. fib.  Recommend trying to maintain sinus rhythm, would start amiodarone 200 mg twice daily x2 weeks then decrease dose to 200 mg daily.  Would plan to continue amiodarone as he recovers from his surgery, can likely stop after a month if maintaining sinus rhythm -Continue warfarin, follows with Coumadin clinic.  INR 1.9 today,  warfarin dose adjusted -Continue metoprolol 25 mg twice daily -Check CMET, CBC, TSH   RTC: has appointment with Edd Fabian next week   Medication Adjustments/Labs and Tests Ordered: Current medicines are reviewed at length with the patient today.  Concerns regarding medicines are outlined above.  Orders Placed This Encounter  Procedures  . Comprehensive metabolic panel  . CBC  . TSH  . EKG 12-Lead   Meds ordered this encounter  Medications  . amiodarone (PACERONE) 200 MG tablet    Sig: Take 200 mg (1 tablet) two times daily for 14 days, then decrease to 200 mg once daily    Dispense:  60 tablet    Refill:  0    Patient Instructions  Medication Instructions:  START amiodarone 200 mg two times daily x 14 days, then decrease to 200 mg once daily  *If you need a refill on your cardiac medications before your next appointment, please call your pharmacy*  Follow-Up: At [facility], you and your health needs are our priority.  As part of our continuing mission to provide you with exceptional heart care, we have created designated Provider Care Teams.  These Care Teams include your primary Cardiologist (physician) and Advanced Practice Providers (APPs -  Physician Assistants and Nurse Practitioners) who all work together to provide you with the care you need, when you need  it.  We recommend signing up for the patient portal called "MyChart".  Sign up information is provided on this After Visit Summary.  MyChart is used to connect with patients for Virtual Visits (Telemedicine).  Patients are able to view lab/test results, encounter notes, upcoming appointments, etc.  Non-urgent messages can be sent to your provider as well.   To learn more about what you can do with MyChart, go to [email or web address].    Your next appointment:   AS SCHEDULED with Edd Fabian, NP 6/7 INR check same day   Caprice Beaver as a scribe for Little Ishikawa, MD.,have documented all relevant documentation on the behalf of Little Ishikawa, MD,as directed by  Little Ishikawa, MD while in the presence of Little Ishikawa, MD.  I, Little Ishikawa, MD, have reviewed all documentation for this visit. The documentation on 12/31/20 for the exam, diagnosis, procedures, and orders are all accurate and complete.    Signed, Little Ishikawa, MD  12/31/2020 5:40 PM    [facility]

## 2020-12-31 NOTE — Patient Instructions (Signed)
Medication Instructions:  START amiodarone 200 mg two times daily x 14 days, then decrease to 200 mg once daily  *If you need a refill on your cardiac medications before your next appointment, please call your pharmacy*  Follow-Up: At [facility], you and your health needs are our priority.  As part of our continuing mission to provide you with exceptional heart care, we have created designated Provider Care Teams.  These Care Teams include your primary Cardiologist (physician) and Advanced Practice Providers (APPs -  Physician Assistants and Nurse Practitioners) who all work together to provide you with the care you need, when you need it.  We recommend signing up for the patient portal called "MyChart".  Sign up information is provided on this After Visit Summary.  MyChart is used to connect with patients for Virtual Visits (Telemedicine).  Patients are able to view lab/test results, encounter notes, upcoming appointments, etc.  Non-urgent messages can be sent to your provider as well.   To learn more about what you can do with MyChart, go to [email or web address].    Your next appointment:   AS SCHEDULED with Edd Fabian, NP 6/7 INR check same day

## 2020-12-31 NOTE — Progress Notes (Deleted)
Cardiology Office Note:    Date:  12/31/2020   ID:  Perry Lewis, DOB 1966-07-10, MRN [id number]  PCP:  Eartha Inch, MD  Cardiologist:  None  Electrophysiologist:  None   Referring MD: Eartha Inch, MD   No chief complaint on file. ***  History of Present Illness:    [name] is a 55 y.o. male with a hx of mitral valve prolapse with severe MR is status post mitral valve repair on 12/15/2020.  Past Medical History:  Diagnosis Date  . Mitral regurgitation   . Mitral valve prolapse   . Rheumatoid arthritis (HCC)   . S/P minimally invasive mitral valve repair 12/15/2020   Complex valvuloplasty including artificial Gore-tex neochord placement x10 with 34 mm Medtronic Simuform ring annuloplasty via right mini thoracotomy approach    Past Surgical History:  Procedure Laterality Date  . BUBBLE STUDY  10/26/2020   Procedure: BUBBLE STUDY;  Surgeon: Sande Rives, MD;  Location: [facility] ENDOSCOPY;  Service: Cardiovascular;;  . HERNIA REPAIR    . LAPAROSCOPIC APPENDECTOMY N/A 11/27/2018   Procedure: APPENDECTOMY LAPAROSCOPIC;  Surgeon: Gaynelle Adu, MD;  Location: [facility] ORS;  Service: General;  Laterality: N/A;  . MITRAL VALVE REPAIR Right 12/15/2020   Procedure: MINIMALLY INVASIVE MITRAL VALVE REPAIR (MVR);  Surgeon: Purcell Nails, MD;  Location: [facility] OR;  Service: Open Heart Surgery;  Laterality: Right;  . TEE WITHOUT CARDIOVERSION N/A 10/26/2020   Procedure: TRANSESOPHAGEAL ECHOCARDIOGRAM (TEE);  Surgeon: Sande Rives, MD;  Location: [facility] ENDOSCOPY;  Service: Cardiovascular;  Laterality: N/A;  . TEE WITHOUT CARDIOVERSION N/A 12/15/2020   Procedure: TRANSESOPHAGEAL ECHOCARDIOGRAM (TEE);  Surgeon: Purcell Nails, MD;  Location: [facility] OR;  Service: Open Heart Surgery;  Laterality: N/A;    Current Medications: Current Meds  Medication Sig  . acetaminophen (TYLENOL) 500 MG tablet Take 2 tablets (1,000 mg total) by mouth every 6 (six) hours as needed.  Marland Kitchen  aspirin EC 81 MG EC tablet Take 1 tablet (81 mg total) by mouth daily. Swallow whole.  . folic acid (FOLVITE) 1 MG tablet Take 2 mg by mouth daily.  . methotrexate (RHEUMATREX) 2.5 MG tablet Take 15 mg by mouth every Tuesday. Caution:Chemotherapy. Protect from light.  . metoprolol tartrate (LOPRESSOR) 25 MG tablet Take 1 tablet (25 mg total) by mouth 2 (two) times daily.  Marland Kitchen warfarin (COUMADIN) 5 MG tablet Take 1 tablet (5 mg total) by mouth daily at 4 PM.     Allergies:   Patient has no known allergies.   Social History   Socioeconomic History  . Marital status: Single    Spouse name: Not on file  . Number of children: Not on file  . Years of education: Not on file  . Highest education level: Not on file  Occupational History  . Occupation: Adult nurse  Tobacco Use  . Smoking status: Former Games developer  . Smokeless tobacco: Never Used  Vaping Use  . Vaping Use: Never used  Substance and Sexual Activity  . Alcohol use: Yes    Comment: 2x week  . Drug use: Never  . Sexual activity: Not on file  Other Topics Concern  . Not on file  Social History Narrative  . Not on file   Social Determinants of Health   Financial Resource Strain: Not on file  Food Insecurity: Not on file  Transportation Needs: Not on file  Physical Activity: Not on file  Stress: Not on file  Social Connections: Not on  file     Family History: The patient's ***family history is not on file.  ROS:   Please see the history of present illness.    *** All other systems reviewed and are negative.  EKGs/Labs/Other Studies Reviewed:    The following studies were reviewed today: ***  EKG:  EKG is *** ordered today.  The ekg ordered today demonstrates ***  Recent Labs: 12/11/2020: ALT 35 12/16/2020: Magnesium 2.1 12/18/2020: BUN 7; Creatinine, Ser 0.55; Hemoglobin 10.3; Platelets 193; Potassium 4.2; Sodium 127  Recent Lipid Panel No results found for: CHOL, TRIG, HDL, CHOLHDL, VLDL, LDLCALC,  LDLDIRECT  Physical Exam:    VS:  BP (!) 90/57 (BP Location: Left Arm, Patient Position: Sitting)   Pulse 97   Ht 5\' 9"  (1.753 m)   Wt 163 lb 12.8 oz (74.3 kg)   SpO2 97%   BMI 24.19 kg/m     Wt Readings from Last 3 Encounters:  12/31/20 163 lb 12.8 oz (74.3 kg)  12/19/20 169 lb 15.6 oz (77.1 kg)  12/14/20 170 lb (77.1 kg)     GEN: *** Well nourished, well developed in no acute distress HEENT: Normal NECK: No JVD; No carotid bruits LYMPHATICS: No lymphadenopathy CARDIAC: ***RRR, no murmurs, rubs, gallops RESPIRATORY:  Clear to auscultation without rales, wheezing or rhonchi  ABDOMEN: Soft, non-tender, non-distended MUSCULOSKELETAL:  No edema; No deformity  SKIN: Warm and dry NEUROLOGIC:  Alert and oriented x 3 PSYCHIATRIC:  Normal affect   ASSESSMENT:    No diagnosis found. PLAN:    In order of problems listed above:  1. ***   Medication Adjustments/Labs and Tests Ordered: Current medicines are reviewed at length with the patient today.  Concerns regarding medicines are outlined above.  No orders of the defined types were placed in this encounter.  No orders of the defined types were placed in this encounter.   There are no Patient Instructions on file for this visit.   Signed, 12/16/20, MD  12/31/2020 4:00 PM    [facility]

## 2020-12-31 NOTE — Patient Instructions (Signed)
Take 1.5 tablets tonight only and then Increase to 1 tablet Daily, except 1.5 tablets on Wednesday.  INR in 1 week  [phone]

## 2021-01-01 ENCOUNTER — Other Ambulatory Visit: Payer: Self-pay | Admitting: Thoracic Surgery (Cardiothoracic Vascular Surgery)

## 2021-01-01 DIAGNOSIS — Z9889 Other specified postprocedural states: Secondary | ICD-10-CM

## 2021-01-01 DIAGNOSIS — I340 Nonrheumatic mitral (valve) insufficiency: Secondary | ICD-10-CM | POA: Diagnosis not present

## 2021-01-01 DIAGNOSIS — Z7901 Long term (current) use of anticoagulants: Secondary | ICD-10-CM | POA: Diagnosis not present

## 2021-01-01 DIAGNOSIS — I4891 Unspecified atrial fibrillation: Secondary | ICD-10-CM | POA: Diagnosis not present

## 2021-01-01 LAB — CBC
Hematocrit: 30.9 % — ABNORMAL LOW (ref 37.5–51.0)
Hemoglobin: 10.4 g/dL — ABNORMAL LOW (ref 13.0–17.7)
MCH: 30.9 pg (ref 26.6–33.0)
MCHC: 33.7 g/dL (ref 31.5–35.7)
MCV: 92 fL (ref 79–97)
Platelets: 697 10*3/uL — ABNORMAL HIGH (ref 150–450)
RBC: 3.37 x10E6/uL — ABNORMAL LOW (ref 4.14–5.80)
RDW: 12.5 % (ref 11.6–15.4)
WBC: 7.7 10*3/uL (ref 3.4–10.8)

## 2021-01-01 LAB — COMPREHENSIVE METABOLIC PANEL
ALT: 57 IU/L — ABNORMAL HIGH (ref 0–44)
AST: 45 IU/L — ABNORMAL HIGH (ref 0–40)
Albumin/Globulin Ratio: 1.5 (ref 1.2–2.2)
Albumin: 4 g/dL (ref 3.8–4.9)
Alkaline Phosphatase: 209 IU/L — ABNORMAL HIGH (ref 44–121)
BUN/Creatinine Ratio: 12 (ref 9–20)
BUN: 9 mg/dL (ref 6–24)
Bilirubin Total: 0.4 mg/dL (ref 0.0–1.2)
CO2: 24 mmol/L (ref 20–29)
Calcium: 9.9 mg/dL (ref 8.7–10.2)
Chloride: 98 mmol/L (ref 96–106)
Creatinine, Ser: 0.74 mg/dL — ABNORMAL LOW (ref 0.76–1.27)
Globulin, Total: 2.7 g/dL (ref 1.5–4.5)
Glucose: 87 mg/dL (ref 65–99)
Potassium: 4.9 mmol/L (ref 3.5–5.2)
Sodium: 135 mmol/L (ref 134–144)
Total Protein: 6.7 g/dL (ref 6.0–8.5)
eGFR: 107 mL/min/{1.73_m2} (ref >59)

## 2021-01-01 LAB — TSH: TSH: 1.7 u[IU]/mL (ref 0.450–4.500)

## 2021-01-01 NOTE — Progress Notes (Signed)
Patient scheduled for next week-  Canceled follow up with Verdon Cummins,  and moved it to a follow up with Dr.O'Neal.  Patient made aware.  Verbalized understanding.

## 2021-01-03 NOTE — Progress Notes (Signed)
Cardiology Office Note:   Date:  01/06/2021  NAME:  Perry Lewis    MRN: [id number] DOB:  [birth year]   PCP:  Eartha Inch, MD  Cardiologist:  None  Electrophysiologist:  None   Referring MD: Eartha Inch, MD   Chief Complaint  Patient presents with  . Follow-up    History of Present Illness:   Perry Lewis is a 55 y.o. male with a hx of severe MR s/p MV repair, RA, HLD who presents for follow-up. Postop echo shows mild SAM. Found to be in Afib with RVR at last INR check. Follow-up today. EKG shows he is in sinus rhythm.  He reports he is done fairly well since surgery.  He has had a right pleural effusion.  He has been seen by surgery and they are trying Lasix before drainage.  Lung sounds clear on examination so this is likely improving.  He reports that he has noticed when he is in A. fib.  He feels a rapid and hard irregular beat.  He was seen last week and started on amiodarone.  No recurrence of arrhythmia.  I suspect this will be a short-term problem for him related to mitral valve surgery.  He does have a noticeable murmur on examination consistent with outflow tract obstruction.  He is on metoprolol.  We discussed that this occurs in 5 to 10% of mitral valve surgeries.  I suspect this will be short-lived and not require reoperation.  As he recovers from surgery think should get better.  He is recovering and surgical sites.  Denies any significant chest pain or trouble breathing.  He is walking 30 minutes/day.  He has plans to participate in cardiac rehab.  We discussed antibiotics before any dental work.  He is understands this.  He overall seems to be doing remarkably well since surgery.  I think things will continue to improve.  We will plan for 3 months of Coumadin therapy.  Problem List 1. Severe mitral regurgitation  -s/p valvuloplasty/34 mm annuloplasty ring (12/15/2020 Dr. Cornelius Moras) -CCTA normal (10/07/2020) -EF 50-55% 12/17/2020 --residual SAM with LVOT  obstruction (9 mmHG at rest; 40 mmHG w/valsalva; 2/2 excessive PMVL) 2. RA 3. HLD -T chol 216, LDL 141, TG 163, HDL 42  Past Medical History: Past Medical History:  Diagnosis Date  . Mitral regurgitation   . Mitral valve prolapse   . Rheumatoid arthritis (HCC)   . S/P minimally invasive mitral valve repair 12/15/2020   Complex valvuloplasty including artificial Gore-tex neochord placement x10 with 34 mm Medtronic Simuform ring annuloplasty via right mini thoracotomy approach    Past Surgical History: Past Surgical History:  Procedure Laterality Date  . BUBBLE STUDY  10/26/2020   Procedure: BUBBLE STUDY;  Surgeon: Sande Rives, MD;  Location: [facility] ENDOSCOPY;  Service: Cardiovascular;;  . HERNIA REPAIR    . LAPAROSCOPIC APPENDECTOMY N/A 11/27/2018   Procedure: APPENDECTOMY LAPAROSCOPIC;  Surgeon: Gaynelle Adu, MD;  Location: [facility] ORS;  Service: General;  Laterality: N/A;  . MITRAL VALVE REPAIR Right 12/15/2020   Procedure: MINIMALLY INVASIVE MITRAL VALVE REPAIR (MVR);  Surgeon: Purcell Nails, MD;  Location: [facility] OR;  Service: Open Heart Surgery;  Laterality: Right;  . TEE WITHOUT CARDIOVERSION N/A 10/26/2020   Procedure: TRANSESOPHAGEAL ECHOCARDIOGRAM (TEE);  Surgeon: Sande Rives, MD;  Location: [facility] ENDOSCOPY;  Service: Cardiovascular;  Laterality: N/A;  . TEE WITHOUT CARDIOVERSION N/A 12/15/2020   Procedure: TRANSESOPHAGEAL ECHOCARDIOGRAM (TEE);  Surgeon: Purcell Nails, MD;  Location: [facility]  OR;  Service: Open Heart Surgery;  Laterality: N/A;    Current Medications: Current Meds  Medication Sig  . amiodarone (PACERONE) 200 MG tablet Take 200 mg (1 tablet) two times daily for 14 days, then decrease to 200 mg once daily  . amoxicillin (AMOXIL) 500 MG tablet Take 4 tablets 30-60 minutes before dental procedures.  Marland Kitchen aspirin EC 81 MG EC tablet Take 1 tablet (81 mg total) by mouth daily. Swallow whole.  . folic acid (FOLVITE) 1 MG tablet Take 2 mg by mouth daily.  . furosemide  (LASIX) 40 MG tablet Take 1 tablet (40 mg total) by mouth daily for 28 days.  . methotrexate (RHEUMATREX) 2.5 MG tablet Take 15 mg by mouth every Tuesday. Caution:Chemotherapy. Protect from light.  . metoprolol tartrate (LOPRESSOR) 25 MG tablet Take 1 tablet (25 mg total) by mouth 2 (two) times daily.  . potassium chloride (KLOR-CON) 10 MEQ tablet Take 1 tablet (10 mEq total) by mouth daily.  Marland Kitchen warfarin (COUMADIN) 5 MG tablet Take 1 tablet (5 mg total) by mouth daily at 4 PM.     Allergies:    Patient has no known allergies.   Social History: Social History   Socioeconomic History  . Marital status: Single    Spouse name: Not on file  . Number of children: Not on file  . Years of education: Not on file  . Highest education level: Not on file  Occupational History  . Occupation: Adult nurse  Tobacco Use  . Smoking status: Former Games developer  . Smokeless tobacco: Never Used  Vaping Use  . Vaping Use: Never used  Substance and Sexual Activity  . Alcohol use: Yes    Comment: 2x week  . Drug use: Never  . Sexual activity: Not on file  Other Topics Concern  . Not on file  Social History Narrative  . Not on file   Social Determinants of Health   Financial Resource Strain: Not on file  Food Insecurity: Not on file  Transportation Needs: Not on file  Physical Activity: Not on file  Stress: Not on file  Social Connections: Not on file     Family History: The patient's family history is not on file.  ROS:   All other ROS reviewed and negative. Pertinent positives noted in the HPI.     EKGs/Labs/Other Studies Reviewed:   The following studies were personally reviewed by me today:  EKG:  EKG is ordered today.  The ekg ordered today demonstrates normal sinus rhythm heart rate 77, no acute ischemic changes or evidence of infarction, and was personally reviewed by me.   TTE 12/17/2020  1. Left ventricular ejection fraction, by estimation, is 50 to 55%. The  left  ventricle has low normal function. The left ventricle has no regional  wall motion abnormalities.  2. Right ventricular systolic function is normal. The right ventricular  size is normal. There is mildly elevated pulmonary artery systolic  pressure. The estimated right ventricular systolic pressure is 38.8 mmHg.  3. Left atrial size was mildly dilated.  4. The mitral leaflets are redundant and myxomatous. The annuloplasty  ring is well seated. There is mild systolic anterior motion of the  anterior mitral leaflet. At rest there is minimal obstruction in the LV  outflow tract (peak gradient 19, mean  gradient 9 mm Hg). This becomes moderate with the Valsalva maneuver (peak  gradient 40, mean gradient 15 mm Hg).. The mitral valve has been  repaired/replaced. Mild mitral valve  regurgitation. The mean mitral valve  gradient is 3.0 mmHg. There is a 34mm  Medtronic prosthetic annuloplasty ring present in the mitral position.  Echo findings are consistent with normal structure and function of the  mitral valve prosthesis.  5. The aortic valve is tricuspid. Aortic valve regurgitation is not  visualized. No aortic stenosis is present.  6. The inferior vena cava is dilated in size with <50% respiratory  variability, suggesting right atrial pressure of 15 mmHg.   Recent Labs: 12/16/2020: Magnesium 2.1 01/01/2021: ALT 57; BUN 9; Creatinine, Ser 0.74; Hemoglobin 10.4; Platelets 697; Potassium 4.9; Sodium 135; TSH 1.700   Recent Lipid Panel No results found for: CHOL, TRIG, HDL, CHOLHDL, VLDL, LDLCALC, LDLDIRECT  Physical Exam:   VS:  BP 122/70   Pulse 77   Ht  (1.753 m)   Wt 161 lb 12.8 oz (73.4 kg)   SpO2 97%   BMI 23.89 kg/m    Wt Readings from Last 3 Encounters:  01/06/21 161 lb 12.8 oz (73.4 kg)  01/04/21 168 lb (76.2 kg)  12/31/20 163 lb 12.8 oz (74.3 kg)    General: Well nourished, well developed, in no acute distress Head: Atraumatic, normal size  Eyes: PEERLA, EOMI   Neck: Supple, no JVD Endocrine: No thryomegaly Cardiac: Normal S1, S2; RRR; 2 out of 6 systolic ejection murmur, no increase with Valsalva Lungs: Clear to auscultation bilaterally, no wheezing, rhonchi or rales  Abd: Soft, nontender, no hepatomegaly  Ext: No edema, pulses 2+ Musculoskeletal: No deformities, BUE and BLE strength normal and equal Skin: Warm and dry, no rashes   Neuro: Alert and oriented to person, place, time, and situation, CNII-XII grossly intact, no focal deficits  Psych: Normal mood and affect   ASSESSMENT:   Laken Lobato is a 55 y.o. male who presents for the following: 1. S/P MVR (mitral valve repair)   2. Mitral valve prolapse   3. Mitral valve insufficiency, unspecified etiology   4. SBE (subacute bacterial endocarditis) prophylaxis candidate   5. Persistent atrial fibrillation (HCC)     PLAN:   1. S/P MVR (mitral valve repair) 2. Mitral valve prolapse 3. Mitral valve insufficiency, unspecified etiology -Status post complex mitral valve repair with 34 mm annuloplasty ring on 12/15/2020.  He does have a right pleural effusion which surgery is monitoring.  May need drainage.  Continue Lasix per their recommendations. -He does have residual Sam with LVOT obstruction.  Only up to 40 mmHg with Valsalva on recent echocardiogram.  This is related to excessive posterior mitral valve leaflet tissue.  I suspect this will continue to improve.  We will continue his metoprolol.  We will plan to recheck an echocardiogram in 2 to 3 months just to reevaluate this.  He seems to be doing well. -He should also remain well-hydrated. -We will complete 3 months of anticoagulation with warfarin. -He did have atrial fibrillation found last week.  On amiodarone.  We will plan for 1 month of this.  Suspect this is just related to mitral valve surgery. -He was counseled on antibiotics before dental work. -He will see me back in 6 weeks for repeat EKG and just to follow-up.  4. SBE  (subacute bacterial endocarditis) prophylaxis candidate -2 g of amoxicillin prescribed.  He will take this 30 to 60 minutes before any dental work.  5. Persistent atrial fibrillation (HCC) -Diagnosed with atrial fibrillation last week.  Suspect this is postoperative.  Started on amiodarone.  He is in sinus rhythm today.  We will continue with a 1 month course of amiodarone.  200 mg twice daily for 14 days and then 200 mg daily for 14 days.  We discussed the apple watch to monitor his rhythm.  We will likely plan for a formal monitor after he completes 1 month of amiodarone.  Disposition: Return in about 6 weeks (around 02/17/2021).  Medication Adjustments/Labs and Tests Ordered: Current medicines are reviewed at length with the patient today.  Concerns regarding medicines are outlined above.  Orders Placed This Encounter  Procedures  . EKG 12-Lead   Meds ordered this encounter  Medications  . amoxicillin (AMOXIL) 500 MG tablet    Sig: Take 4 tablets 30-60 minutes before dental procedures.    Dispense:  4 tablet    Refill:  1    Patient Instructions  Medication Instructions:  Take Amiodarone 200 mg daily for 2 weeks then stop  Take Amoxicillin 500 mg (take 4 tablets 30-60 minutes) before any dental work.  *If you need a refill on your cardiac medications before your next appointment, please call your pharmacy*   Follow-Up: At [facility], you and your health needs are our priority.  As part of our continuing mission to provide you with exceptional heart care, we have created designated Provider Care Teams.  These Care Teams include your primary Cardiologist (physician) and Advanced Practice Providers (APPs -  Physician Assistants and Nurse Practitioners) who all work together to provide you with the care you need, when you need it.  We recommend signing up for the patient portal called "MyChart".  Sign up information is provided on this After Visit Summary.  MyChart is used to  connect with patients for Virtual Visits (Telemedicine).  Patients are able to view lab/test results, encounter notes, upcoming appointments, etc.  Non-urgent messages can be sent to your provider as well.   To learn more about what you can do with MyChart, go to [email or web address].    Your next appointment:   6 week(s)  The format for your next appointment:   In Person  Provider:   Lennie Odor, MD         Time Spent with Patient: I have spent a total of 35 minutes with patient reviewing hospital notes, telemetry, EKGs, labs and examining the patient as well as establishing an assessment and plan that was discussed with the patient.  > 50% of time was spent in direct patient care.  Signed, Lenna Gilford. Flora Lipps, MD, [facility]  [facility]  [address], [address] [place], [place] [address] [phone]  01/06/2021 9:34 AM

## 2021-01-04 ENCOUNTER — Telehealth (HOSPITAL_COMMUNITY): Payer: Self-pay

## 2021-01-04 ENCOUNTER — Ambulatory Visit (INDEPENDENT_AMBULATORY_CARE_PROVIDER_SITE_OTHER): Payer: Self-pay | Admitting: Physician Assistant

## 2021-01-04 ENCOUNTER — Other Ambulatory Visit: Payer: Self-pay | Admitting: Physician Assistant

## 2021-01-04 ENCOUNTER — Other Ambulatory Visit: Payer: Self-pay

## 2021-01-04 ENCOUNTER — Ambulatory Visit
Admission: RE | Admit: 2021-01-04 | Discharge: 2021-01-04 | Disposition: A | Discharge status: Home / Self Care | Payer: BC Managed Care – PPO | Source: Ambulatory Visit | Attending: Thoracic Surgery (Cardiothoracic Vascular Surgery) | Admitting: Thoracic Surgery (Cardiothoracic Vascular Surgery)

## 2021-01-04 VITALS — BP 142/80 | HR 87 | Resp 20 | Wt 168.0 lb

## 2021-01-04 DIAGNOSIS — J90 Pleural effusion, not elsewhere classified: Secondary | ICD-10-CM

## 2021-01-04 DIAGNOSIS — Z9889 Other specified postprocedural states: Secondary | ICD-10-CM

## 2021-01-04 DIAGNOSIS — I480 Paroxysmal atrial fibrillation: Secondary | ICD-10-CM

## 2021-01-04 DIAGNOSIS — J9811 Atelectasis: Secondary | ICD-10-CM | POA: Diagnosis not present

## 2021-01-04 DIAGNOSIS — Z952 Presence of prosthetic heart valve: Secondary | ICD-10-CM | POA: Diagnosis not present

## 2021-01-04 MED ORDER — FUROSEMIDE 40 MG PO TABS
40.0000 mg | ORAL_TABLET | Freq: Every day | ORAL | 1 refills | Status: DC
Start: 2021-01-04 — End: 2021-02-05

## 2021-01-04 MED ORDER — POTASSIUM CHLORIDE CRYS ER 10 MEQ PO TBCR: 10.0000 meq | EXTENDED_RELEASE_TABLET | Freq: Every day | ORAL | 1 refills | Status: DC

## 2021-01-04 NOTE — Patient Instructions (Signed)
May resume driving. Begin Lasix 40mg  daily and K-dur daily.  Return in 2 weeks for follow up chest x-ray.

## 2021-01-04 NOTE — Progress Notes (Signed)
HPI:  Patient returns for routine postoperative follow-up having undergone minimally invasive mitral valve repair by Dr. Cornelius Moras on 12/15/20.  The patient's early postoperative recovery while in the hospital was uneventful and he was discharged on post-op day 4.  Since hospital discharge he has developed atrial fibrillation. This was initially discovered when he presented for an INR on 12/31/20.  He was started on oral amiodarone at 200mg  PO BID and does not feel that he has had any recurrence.  He does report that he fatigues more easily with his walks compared to a few weeks ago.  He has resumed working from home.     Current Outpatient Medications  Medication Sig Dispense Refill  . acetaminophen (TYLENOL) 500 MG tablet Take 2 tablets (1,000 mg total) by mouth every 6 (six) hours as needed. 30 tablet 0  . amiodarone (PACERONE) 200 MG tablet Take 200 mg (1 tablet) two times daily for 14 days, then decrease to 200 mg once daily 60 tablet 0  . aspirin EC 81 MG EC tablet Take 1 tablet (81 mg total) by mouth daily. Swallow whole. 30 tablet 11  . folic acid (FOLVITE) 1 MG tablet Take 2 mg by mouth daily.    . methotrexate (RHEUMATREX) 2.5 MG tablet Take 15 mg by mouth every Tuesday. Caution:Chemotherapy. Protect from light.    . metoprolol tartrate (LOPRESSOR) 25 MG tablet Take 1 tablet (25 mg total) by mouth 2 (two) times daily. 60 tablet 2  . warfarin (COUMADIN) 5 MG tablet Take 1 tablet (5 mg total) by mouth daily at 4 PM. 30 tablet 2   No current facility-administered medications for this visit.    Physical Exam   VS: P  87 BP  142/80 RR  20 SaO2  98%  General: no distess, says he feels well. He is not requiring any pain medication.  Heart: RRR, rhythm strip done here in the office show NSR.  There is a 2-3/6 systolic murmur.  Chest- breath sounds are clear. Wound- the right chest incision and chest tube exit sites are well healed.      Diagnostic Tests: CLINICAL DATA:  Status  post mitral valve repair 12/15/2020.  EXAM: CHEST - 2 VIEW  COMPARISON:  Radiograph 12/19/2020  FINDINGS: Prosthetic mitral valve. Normal heart size with stable mediastinal contours. Right pleural effusion, small to moderate with associated basilar atelectasis. Suggestion of small right apical pneumothorax visualized at the level of the right posterior third rib, similar in appearance to prior exam. Left lung is clear. There is no pulmonary edema. No acute osseous abnormalities are seen.  IMPRESSION: 1. Small to moderate right pleural effusion with associated basilar atelectasis. 2. Suggestion of small right apical pneumothorax, similar in appearance to prior exam.  These results will be called to the ordering clinician or representative by the Radiologist Assistant, and communication documented in the PACS or 12/21/2020.   Electronically Signed   By: [employer] M.D.   On: 01/04/2021 15:20  Impression / Plan:  -1 month s/p mitral valve repair for severe MVP with MR. He is progressing well overall but has noted increased fatigue and shortness of breath in recent days. He developed atrial fibrillation since discharge that was picked up 5 days ago during a nurse visit to his cardiologist for an INR. He was started on an oral amiodarone load and has continued his metoprolol.  He has not had any sensation of irregular rhythm or palpitations since that episode.  He is also noted to  have a new right pleural effusion and a tiny apical PTX on the CXR today. I do not think this should require drainage at this stage and will treat with Lasix for now. Will schedule return visit with repeat CXR in 2 weeks.  I also asked him to contact us sooner if his shortness of breath worsens.   The INR was 1.9 on 01/01/21 and will be repeated later this week.  He has scheduled follow up with his cardiologist later this week as well.    Leary Roca, PA-C [facility] [phone]

## 2021-01-04 NOTE — Telephone Encounter (Signed)
Pt insurance is active and benefits verified through [employer] Co-pay 0, DED $3,000/$3,000 met, out of pocket $4,500/$4,500 met, co-insurance 20%. no pre-authorization required. Passport, 01/04/2021'@11' :08am, REF# [id number]  Will contact patient to see if he is interested in the Cardiac Rehab Program. If interested, patient will need to complete follow up appt. Once completed, patient will be contacted for scheduling upon review by the RN Navigator.

## 2021-01-05 ENCOUNTER — Encounter: Payer: Self-pay | Admitting: Thoracic Surgery (Cardiothoracic Vascular Surgery)

## 2021-01-05 ENCOUNTER — Ambulatory Visit: Payer: BC Managed Care – PPO | Admitting: General Practice

## 2021-01-05 ENCOUNTER — Encounter: Payer: Self-pay | Admitting: *Deleted

## 2021-01-06 ENCOUNTER — Ambulatory Visit (INDEPENDENT_AMBULATORY_CARE_PROVIDER_SITE_OTHER): Payer: BC Managed Care – PPO | Admitting: Cardiovascular Disease

## 2021-01-06 ENCOUNTER — Ambulatory Visit (INDEPENDENT_AMBULATORY_CARE_PROVIDER_SITE_OTHER): Payer: BC Managed Care – PPO

## 2021-01-06 ENCOUNTER — Encounter: Payer: Self-pay | Admitting: Cardiovascular Disease

## 2021-01-06 ENCOUNTER — Other Ambulatory Visit: Payer: Self-pay

## 2021-01-06 VITALS — BP 122/70 | HR 77 | Ht 69.0 in | Wt 161.8 lb

## 2021-01-06 DIAGNOSIS — Z9889 Other specified postprocedural states: Secondary | ICD-10-CM

## 2021-01-06 DIAGNOSIS — I340 Nonrheumatic mitral (valve) insufficiency: Secondary | ICD-10-CM

## 2021-01-06 DIAGNOSIS — Z298 Encounter for other specified prophylactic measures: Secondary | ICD-10-CM | POA: Diagnosis not present

## 2021-01-06 DIAGNOSIS — I341 Nonrheumatic mitral (valve) prolapse: Secondary | ICD-10-CM

## 2021-01-06 DIAGNOSIS — I4819 Other persistent atrial fibrillation: Secondary | ICD-10-CM

## 2021-01-06 DIAGNOSIS — Z7901 Long term (current) use of anticoagulants: Secondary | ICD-10-CM

## 2021-01-06 LAB — POCT INR: INR: 2 (ref 2.0–3.0)

## 2021-01-06 MED ORDER — AMOXICILLIN 500 MG PO TABS: ORAL_TABLET | ORAL | 1 refills | Status: DC

## 2021-01-06 NOTE — Patient Instructions (Addendum)
Medication Instructions:  Take Amiodarone 200 mg daily for 2 weeks then stop  Take Amoxicillin 500 mg (take 4 tablets 30-60 minutes) before any dental work.  *If you need a refill on your cardiac medications before your next appointment, please call your pharmacy*   Follow-Up: At [facility], you and your health needs are our priority.  As part of our continuing mission to provide you with exceptional heart care, we have created designated Provider Care Teams.  These Care Teams include your primary Cardiologist (physician) and Advanced Practice Providers (APPs -  Physician Assistants and Nurse Practitioners) who all work together to provide you with the care you need, when you need it.  We recommend signing up for the patient portal called "MyChart".  Sign up information is provided on this After Visit Summary.  MyChart is used to connect with patients for Virtual Visits (Telemedicine).  Patients are able to view lab/test results, encounter notes, upcoming appointments, etc.  Non-urgent messages can be sent to your provider as well.   To learn more about what you can do with MyChart, go to [email or web address].    Your next appointment:   6 week(s)  The format for your next appointment:   In Person  Provider:   Lennie Odor, MD

## 2021-01-06 NOTE — Patient Instructions (Signed)
Continue taking 1 tablet Daily, except 1.5 tablets on Monday and Wednesday.  INR in 3 weeks  [phone]

## 2021-01-14 ENCOUNTER — Other Ambulatory Visit: Payer: Self-pay | Admitting: Thoracic Surgery (Cardiothoracic Vascular Surgery)

## 2021-01-14 DIAGNOSIS — Z9889 Other specified postprocedural states: Secondary | ICD-10-CM

## 2021-01-18 ENCOUNTER — Other Ambulatory Visit: Payer: Self-pay

## 2021-01-18 ENCOUNTER — Ambulatory Visit (INDEPENDENT_AMBULATORY_CARE_PROVIDER_SITE_OTHER): Payer: Self-pay | Admitting: Physician Assistant

## 2021-01-18 ENCOUNTER — Ambulatory Visit
Admission: RE | Admit: 2021-01-18 | Discharge: 2021-01-18 | Disposition: A | Discharge status: Home / Self Care | Payer: BC Managed Care – PPO | Source: Ambulatory Visit | Attending: Thoracic Surgery (Cardiothoracic Vascular Surgery) | Admitting: Thoracic Surgery (Cardiothoracic Vascular Surgery)

## 2021-01-18 VITALS — BP 104/70 | HR 81 | Resp 20 | Ht 69.0 in | Wt 158.0 lb

## 2021-01-18 DIAGNOSIS — Z9889 Other specified postprocedural states: Secondary | ICD-10-CM | POA: Diagnosis not present

## 2021-01-18 DIAGNOSIS — R918 Other nonspecific abnormal finding of lung field: Secondary | ICD-10-CM | POA: Diagnosis not present

## 2021-01-18 DIAGNOSIS — I480 Paroxysmal atrial fibrillation: Secondary | ICD-10-CM

## 2021-01-18 DIAGNOSIS — Z952 Presence of prosthetic heart valve: Secondary | ICD-10-CM | POA: Diagnosis not present

## 2021-01-18 NOTE — Patient Instructions (Signed)
-  Discontinue the Lasix and K-dur.   -Continue the current prescription for amiodarone then discontinue.   -Follow up as needed.

## 2021-01-18 NOTE — Progress Notes (Signed)
HPI:  Patient returns for routine postoperative follow-up having undergone minimally invasive mitral valve repair placement of cervical neocortex by Dr. Cornelius Moras on 12/15/2020 for severe mitral insufficiency.  His postoperative course was notable for a finding of mild SAM on the postop echo.  This is been evaluated by Dr. Wandra Mannan and is optimistic that it will improve with time.  It is also notable that he was found to be in atrial fibrillation when he presented for an INR at his cardiologist office following discharge from the hospital.  He was started on amiodarone and has since converted back to sinus rhythm.  Since hospital discharge the patient reports    Current Outpatient Medications  Medication Sig Dispense Refill   amiodarone (PACERONE) 200 MG tablet Take 200 mg (1 tablet) two times daily for 14 days, then decrease to 200 mg once daily 60 tablet 0   amoxicillin (AMOXIL) 500 MG tablet Take 4 tablets 30-60 minutes before dental procedures. 4 tablet 1   aspirin EC 81 MG EC tablet Take 1 tablet (81 mg total) by mouth daily. Swallow whole. 30 tablet 11   folic acid (FOLVITE) 1 MG tablet Take 2 mg by mouth daily.     furosemide (LASIX) 40 MG tablet Take 1 tablet (40 mg total) by mouth daily for 28 days. 14 tablet 1   methotrexate (RHEUMATREX) 2.5 MG tablet Take 15 mg by mouth every Tuesday. Caution:Chemotherapy. Protect from light.     metoprolol tartrate (LOPRESSOR) 25 MG tablet Take 1 tablet (25 mg total) by mouth 2 (two) times daily. 60 tablet 2   potassium chloride (KLOR-CON) 10 MEQ tablet Take 1 tablet (10 mEq total) by mouth daily. 14 tablet 1   warfarin (COUMADIN) 5 MG tablet Take 1 tablet (5 mg total) by mouth daily at 4 PM. 30 tablet 2   No current facility-administered medications for this visit.    Physical Exam  VS: Blood pressure 114/70 Pulse: 81 Respirations: 20 Temperature: Afebrile O2 sat: 98% on room air  Heart: Regular rate and rhythm, soft systolic murmur   Chest: breath sounds are clear.  The right thoracotomy incision and chest tube sites are well-healed. Extremities: No edema   Diagnostic Tests:  EXAM: CHEST - 2 VIEW   COMPARISON:  01/04/2021   FINDINGS: Cardiac shadow is stable. Postsurgical changes are again seen. Minimal scarring is noted in the right base but significantly improved when compared with the prior exam. No effusion is seen. No bony abnormality is noted.   IMPRESSION: Minimal right basilar scarring.  No acute abnormality is seen.     Electronically Signed   By: Alcide Clever M.D.   On: 01/18/2021 15:49   Impression / Plan:  -Mr. Snowball is now about 5 weeks post complex mitral valve repair with placement of multiple new cords.  His ejection fraction is 50 to 55% on postop echo.  He was also noted to have some mild systolic anterior motion on postop echo.  This has been seen and evaluated by Dr. Bufford Buttner.  Further follow-up is planned next month in his office.  Last INR was 2.0 on 01/06/2021 and the next INR is scheduled for about a week from now.  -Postoperative atrial fibrillation-this was discovered during his last visit to the Coumadin clinic.  He was started on amiodarone and has since converted back to sinus rhythm.  He denies having any further palpitations.  Agree with cardiology's plan is to discontinue amiodarone after 1 month.  -Right pleural effusion-there is  minimal residual fluid in the right chest.  He will stop taking the Lasix and potassium.  -Mr. Revard has a follow-up echo planned for 01/29/2021 and subsequent follow-up visit with Dr. Bufford Buttner the next week.  We have not scheduled any further follow-up at our office but will be happy to assist in his care as needed.    Leary Roca, PA-C [facility] [phone]

## 2021-01-27 ENCOUNTER — Ambulatory Visit (INDEPENDENT_AMBULATORY_CARE_PROVIDER_SITE_OTHER): Payer: BC Managed Care – PPO | Admitting: Pharmacist Clinician (PhC)/ Clinical Pharmacy Specialist

## 2021-01-27 ENCOUNTER — Other Ambulatory Visit: Payer: Self-pay

## 2021-01-27 DIAGNOSIS — Z9889 Other specified postprocedural states: Secondary | ICD-10-CM | POA: Diagnosis not present

## 2021-01-27 DIAGNOSIS — Z7901 Long term (current) use of anticoagulants: Secondary | ICD-10-CM | POA: Diagnosis not present

## 2021-01-27 LAB — POCT INR: INR: 3 (ref 2.0–3.0)

## 2021-01-27 NOTE — Telephone Encounter (Signed)
Pt called to get scheduled for cardiac rehab, I advised pt that he has been cleared to schedule but we have a backlog unfortunately and that we would call him to schedule at a later date. Pt understood. 

## 2021-01-27 NOTE — Telephone Encounter (Signed)
Pt called to get scheduled for cardiac rehab, I advised pt that he has been cleared to schedule but we have a backlog unfortunately and that we would call him to schedule at a later date. Pt understood.

## 2021-01-29 ENCOUNTER — Ambulatory Visit (HOSPITAL_COMMUNITY): Payer: BC Managed Care – PPO | Attending: Cardiovascular Disease

## 2021-01-29 ENCOUNTER — Other Ambulatory Visit: Payer: Self-pay

## 2021-01-29 ENCOUNTER — Encounter (HOSPITAL_COMMUNITY): Payer: Self-pay

## 2021-02-03 ENCOUNTER — Ambulatory Visit (HOSPITAL_COMMUNITY): Payer: BC Managed Care – PPO | Attending: Cardiovascular Disease

## 2021-02-03 ENCOUNTER — Other Ambulatory Visit: Payer: Self-pay

## 2021-02-03 DIAGNOSIS — I340 Nonrheumatic mitral (valve) insufficiency: Secondary | ICD-10-CM

## 2021-02-03 DIAGNOSIS — Z9889 Other specified postprocedural states: Secondary | ICD-10-CM

## 2021-02-03 DIAGNOSIS — I341 Nonrheumatic mitral (valve) prolapse: Secondary | ICD-10-CM | POA: Diagnosis not present

## 2021-02-03 DIAGNOSIS — Z954 Presence of other heart-valve replacement: Secondary | ICD-10-CM | POA: Diagnosis not present

## 2021-02-03 LAB — ECHOCARDIOGRAM COMPLETE
AR max vel: 2.53 cm2
AV Area VTI: 2.15 cm2
AV Area mean vel: 2.06 cm2
AV Mean grad: 7.7 mmHg
AV Peak grad: 13.1 mmHg
Ao pk vel: 1.81 m/s
Area-P 1/2: 3.05 cm2
S' Lateral: 3 cm

## 2021-02-03 MED ORDER — PERFLUTREN LIPID MICROSPHERE
1.0000 mL | INTRAVENOUS | Status: AC | PRN
  Administered 2021-02-03: 2 mL via INTRAVENOUS

## 2021-02-04 NOTE — Progress Notes (Signed)
Cardiology Office Note:   Date:  02/05/2021  NAME:  Haywood Meinders    MRN: [id number] DOB:  [birth year]   PCP:  Eartha Inch, MD  Cardiologist:  None  Electrophysiologist:  None   Referring MD: Eartha Inch, MD   Chief Complaint  Patient presents with   Follow-up    History of Present Illness:   Donyae Kilner is a 55 y.o. male with a hx of severe MR s/p repair, postop Afib, RA who presents for follow-up. Echo shows that SAM of the mitral valve has improved.  He reports no recurrence of atrial fibrillation.  He is monitoring this periodically with his daughter's apple watch.  He reports he felt when he was in A. fib.  He has had no recurrence of those symptoms.  He will complete 1 more week of amiodarone and stop.  He has 1 more month of Coumadin after mitral valve repair.  Echocardiogram after surgery did show LVOT obstruction from redundant posterior mitral valve leaflets.  We did repeat an echocardiogram which shows a gradient of 9 mmHg.  He does have a prominent systolic ejection murmur on examination.  He has no symptoms.  He is biking 3 miles per session.  He is doing this 5 days/week.  No chest pain or shortness of breath reported.  BP 120/76.  He does report some dizziness and lightheadedness with position change.  We will stop his metoprolol to see how he does.  He denies any symptoms.  He understand that he needs to be on antibiotics before any dental work.  Overall doing well.  Problem List 1. Severe mitral regurgitation -s/p valvuloplasty/34 mm annuloplasty ring (12/15/2020 Dr. Cornelius Moras) -CCTA normal (10/07/2020) -EF 50-55% 12/17/2020 --residual SAM with LVOT obstruction (9 mmHG at rest; 40 mmHG w/valsalva; 2/2 excessive PMVL) -SAM gradients improved 02/03/2021 -> 9 mmHG 2. RA 3. HLD -T chol 216, LDL 141, TG 163, HDL 42 4. Postoperative atrial fibrillation  -12/31/2020 -converted back to NSR on amiodarone   Past Medical History: Past Medical History:  Diagnosis  Date   Mitral regurgitation    Mitral valve prolapse    Rheumatoid arthritis (HCC)    S/P minimally invasive mitral valve repair 12/15/2020   Complex valvuloplasty including artificial Gore-tex neochord placement x10 with 34 mm Medtronic Simuform ring annuloplasty via right mini thoracotomy approach    Past Surgical History: Past Surgical History:  Procedure Laterality Date   BUBBLE STUDY  10/26/2020   Procedure: BUBBLE STUDY;  Surgeon: Sande Rives, MD;  Location: [facility] ENDOSCOPY;  Service: Cardiovascular;;   HERNIA REPAIR     LAPAROSCOPIC APPENDECTOMY N/A 11/27/2018   Procedure: APPENDECTOMY LAPAROSCOPIC;  Surgeon: Gaynelle Adu, MD;  Location: [facility] ORS;  Service: General;  Laterality: N/A;   MITRAL VALVE REPAIR Right 12/15/2020   Procedure: MINIMALLY INVASIVE MITRAL VALVE REPAIR (MVR);  Surgeon: Purcell Nails, MD;  Location: [facility] OR;  Service: Open Heart Surgery;  Laterality: Right;   TEE WITHOUT CARDIOVERSION N/A 10/26/2020   Procedure: TRANSESOPHAGEAL ECHOCARDIOGRAM (TEE);  Surgeon: Sande Rives, MD;  Location: [facility] ENDOSCOPY;  Service: Cardiovascular;  Laterality: N/A;   TEE WITHOUT CARDIOVERSION N/A 12/15/2020   Procedure: TRANSESOPHAGEAL ECHOCARDIOGRAM (TEE);  Surgeon: Purcell Nails, MD;  Location: [facility] OR;  Service: Open Heart Surgery;  Laterality: N/A;    Current Medications: Current Meds  Medication Sig   amoxicillin (AMOXIL) 500 MG tablet Take 4 tablets 30-60 minutes before dental procedures.   aspirin EC 81 MG EC tablet  Take 1 tablet (81 mg total) by mouth daily. Swallow whole.   folic acid (FOLVITE) 1 MG tablet Take 2 mg by mouth daily.   methotrexate (RHEUMATREX) 2.5 MG tablet Take 15 mg by mouth every Tuesday. Caution:Chemotherapy. Protect from light.   warfarin (COUMADIN) 5 MG tablet Take 1 tablet (5 mg total) by mouth daily at 4 PM.   [DISCONTINUED] amiodarone (PACERONE) 200 MG tablet Take 200 mg (1 tablet) two times daily for 14 days, then decrease to 200 mg  once daily   [DISCONTINUED] metoprolol tartrate (LOPRESSOR) 25 MG tablet Take 1 tablet (25 mg total) by mouth 2 (two) times daily.     Allergies:    Patient has no known allergies.   Social History: Social History   Socioeconomic History   Marital status: Single    Spouse name: Not on file   Number of children: Not on file   Years of education: Not on file   Highest education level: Not on file  Occupational History   Occupation: Associate Professor buyer  Tobacco Use   Smoking status: Former    Pack years: 0.00   Smokeless tobacco: Never  Vaping Use   Vaping Use: Never used  Substance and Sexual Activity   Alcohol use: Yes    Comment: 2x week   Drug use: Never   Sexual activity: Not on file  Other Topics Concern   Not on file  Social History Narrative   Not on file   Social Determinants of Health   Financial Resource Strain: Not on file  Food Insecurity: Not on file  Transportation Needs: Not on file  Physical Activity: Not on file  Stress: Not on file  Social Connections: Not on file     Family History: The patient's family history is not on file.  ROS:   All other ROS reviewed and negative. Pertinent positives noted in the HPI.     EKGs/Labs/Other Studies Reviewed:   The following studies were personally reviewed by me today:   TTE 02/03/2021  1. There is systolic anterior motion of the mitral valve leaflets  (largely posterior). There is no significant LVOT obstructoin (peak  gradient ~9 mmHG). No provocative maneuvers performed. Left ventricular  ejection fraction, by estimation, is 55 to 60%.   The left ventricle has normal function. The left ventricle has no  regional wall motion abnormalities. Left ventricular diastolic function  could not be evaluated.   2. 34 mm Medtronic Simuform annuloplasty ring in the MV position  (12/15/2020). The mitral valve has been repaired/replaced. No evidence of  mitral valve regurgitation. The mean mitral valve  gradient is 4.7 mmHg  with average heart rate of 74 bpm. There is  a 34 prosthetic annuloplasty ring present in the mitral position.  Procedure Date: 12/15/2020.   3. Right ventricular systolic function is normal. The right ventricular  size is normal. There is normal pulmonary artery systolic pressure. The  estimated right ventricular systolic pressure is 22.4 mmHg.   4. Left atrial size was mild to moderately dilated.   5. The aortic valve is tricuspid. Aortic valve regurgitation is not  visualized. No aortic stenosis is present.   6. The inferior vena cava is normal in size with greater than 50%  respiratory variability, suggesting right atrial pressure of 3 mmHg.   Recent Labs: 12/16/2020: Magnesium 2.1 01/01/2021: ALT 57; BUN 9; Creatinine, Ser 0.74; Hemoglobin 10.4; Platelets 697; Potassium 4.9; Sodium 135; TSH 1.700   Recent Lipid Panel No results found  for: CHOL, TRIG, HDL, CHOLHDL, VLDL, LDLCALC, LDLDIRECT  Physical Exam:   VS:  BP 120/76   Pulse 78   Ht 5\' 9"  (1.753 m)   Wt 159 lb 6.4 oz (72.3 kg)   SpO2 98%   BMI 23.54 kg/m    Wt Readings from Last 3 Encounters:  02/05/21 159 lb 6.4 oz (72.3 kg)  01/18/21 158 lb (71.7 kg)  01/06/21 161 lb 12.8 oz (73.4 kg)    General: Well nourished, well developed, in no acute distress Head: Atraumatic, normal size  Eyes: PEERLA, EOMI  Neck: Supple, no JVD Endocrine: No thryomegaly Cardiac: Normal S1, S2; RRR; 3 out of 6 systolic ejection murmur, does increase with Valsalva Lungs: Clear to auscultation bilaterally, no wheezing, rhonchi or rales  Abd: Soft, nontender, no hepatomegaly  Ext: No edema, pulses 2+ Musculoskeletal: No deformities, BUE and BLE strength normal and equal Skin: Warm and dry, no rashes   Neuro: Alert and oriented to person, place, time, and situation, CNII-XII grossly intact, no focal deficits  Psych: Normal mood and affect   ASSESSMENT:   Theodis Weiss is a 55 y.o. male who presents for the  following: 1. S/P minimally invasive mitral valve repair   2. Mitral valve prolapse   3. Mitral valve insufficiency, unspecified etiology   4. SBE (subacute bacterial endocarditis) prophylaxis candidate   5. Persistent atrial fibrillation (HCC)     PLAN:   1. S/P minimally invasive mitral valve repair 2. Mitral valve prolapse 3. Mitral valve insufficiency, unspecified etiology 4. SBE (subacute bacterial endocarditis) prophylaxis candidate -Severe mitral vegetation.  Status post annuloplasty on 12/15/2020.  He does have residual Sam with LVOT obstruction up to 9 mmHg at rest.  More prominent with Valsalva.  No symptoms from this.  Appears to be stable.  He does report some dizziness lightheadedness.  We will stop his metoprolol.  No indications for repeat surgery at this time.  We will plan to repeat an echocardiogram in 1 year. -He will continue aspirin 81 mg daily.  He has 1 more month of Coumadin therapy.  He will stop after this. -He understands he needs SBE prophylaxis for dental work -He denies any major symptoms.  Overall is doing well. -Minimal trace pleural effusion.  No indications for thoracentesis.  5. Persistent atrial fibrillation (HCC) -He did have postoperative atrial fibrillation.  We have plan for 1 month of amiodarone therapy.  No symptoms from this.  He seems to be doing well.  He will monitor his heart rhythm with a mobile device.  Disposition: Return in about 6 months (around 08/08/2021).  Medication Adjustments/Labs and Tests Ordered: Current medicines are reviewed at length with the patient today.  Concerns regarding medicines are outlined above.  No orders of the defined types were placed in this encounter.  No orders of the defined types were placed in this encounter.   Patient Instructions  Medication Instructions:  Stop Amiodarone when you are finished.  Stop Metoprolol We will remove Lasix and Potassium from your list.   *If you need a refill on your  cardiac medications before your next appointment, please call your pharmacy*  Follow-Up: At [facility], you and your health needs are our priority.  As part of our continuing mission to provide you with exceptional heart care, we have created designated Provider Care Teams.  These Care Teams include your primary Cardiologist (physician) and Advanced Practice Providers (APPs -  Physician Assistants and Nurse Practitioners) who all work together to provide  you with the care you need, when you need it.  We recommend signing up for the patient portal called "MyChart".  Sign up information is provided on this After Visit Summary.  MyChart is used to connect with patients for Virtual Visits (Telemedicine).  Patients are able to view lab/test results, encounter notes, upcoming appointments, etc.  Non-urgent messages can be sent to your provider as well.   To learn more about what you can do with MyChart, go to [email or web address].    Your next appointment:   6 month(s)  The format for your next appointment:   In Person  Provider:   Lennie Odor, MD     Time Spent with Patient: I have spent a total of 35 minutes with patient reviewing hospital notes, telemetry, EKGs, labs and examining the patient as well as establishing an assessment and plan that was discussed with the patient.  > 50% of time was spent in direct patient care.  Signed, Lenna Gilford. Flora Lipps, MD, [facility]  [facility]  [address], [address] [place], [place] [address] [phone]  02/05/2021 8:20 AM

## 2021-02-05 ENCOUNTER — Encounter: Payer: Self-pay | Admitting: Cardiovascular Disease

## 2021-02-05 ENCOUNTER — Ambulatory Visit (INDEPENDENT_AMBULATORY_CARE_PROVIDER_SITE_OTHER): Payer: BC Managed Care – PPO | Admitting: Cardiovascular Disease

## 2021-02-05 ENCOUNTER — Ambulatory Visit (INDEPENDENT_AMBULATORY_CARE_PROVIDER_SITE_OTHER): Payer: BC Managed Care – PPO | Admitting: Pharmacist Clinician (PhC)/ Clinical Pharmacy Specialist

## 2021-02-05 ENCOUNTER — Other Ambulatory Visit: Payer: Self-pay

## 2021-02-05 VITALS — BP 120/76 | HR 78 | Ht 69.0 in | Wt 159.4 lb

## 2021-02-05 DIAGNOSIS — Z9889 Other specified postprocedural states: Secondary | ICD-10-CM

## 2021-02-05 DIAGNOSIS — Z298 Encounter for other specified prophylactic measures: Secondary | ICD-10-CM

## 2021-02-05 DIAGNOSIS — I341 Nonrheumatic mitral (valve) prolapse: Secondary | ICD-10-CM

## 2021-02-05 DIAGNOSIS — I4819 Other persistent atrial fibrillation: Secondary | ICD-10-CM

## 2021-02-05 DIAGNOSIS — Z7901 Long term (current) use of anticoagulants: Secondary | ICD-10-CM

## 2021-02-05 DIAGNOSIS — I340 Nonrheumatic mitral (valve) insufficiency: Secondary | ICD-10-CM | POA: Diagnosis not present

## 2021-02-05 LAB — POCT INR: INR: 3.2 — AB (ref 2.0–3.0)

## 2021-02-05 NOTE — Patient Instructions (Signed)
Medication Instructions:  Stop Amiodarone when you are finished.  Stop Metoprolol We will remove Lasix and Potassium from your list.   *If you need a refill on your cardiac medications before your next appointment, please call your pharmacy*  Follow-Up: At [facility], you and your health needs are our priority.  As part of our continuing mission to provide you with exceptional heart care, we have created designated Provider Care Teams.  These Care Teams include your primary Cardiologist (physician) and Advanced Practice Providers (APPs -  Physician Assistants and Nurse Practitioners) who all work together to provide you with the care you need, when you need it.  We recommend signing up for the patient portal called "MyChart".  Sign up information is provided on this After Visit Summary.  MyChart is used to connect with patients for Virtual Visits (Telemedicine).  Patients are able to view lab/test results, encounter notes, upcoming appointments, etc.  Non-urgent messages can be sent to your provider as well.   To learn more about what you can do with MyChart, go to [email or web address].    Your next appointment:   6 month(s)  The format for your next appointment:   In Person  Provider:   Lennie Odor, MD

## 2021-02-05 NOTE — Progress Notes (Signed)
Patient seen in office today. 

## 2021-02-26 ENCOUNTER — Other Ambulatory Visit: Payer: Self-pay

## 2021-02-26 ENCOUNTER — Ambulatory Visit (INDEPENDENT_AMBULATORY_CARE_PROVIDER_SITE_OTHER): Payer: BC Managed Care – PPO

## 2021-02-26 DIAGNOSIS — Z7901 Long term (current) use of anticoagulants: Secondary | ICD-10-CM | POA: Diagnosis not present

## 2021-02-26 DIAGNOSIS — Z9889 Other specified postprocedural states: Secondary | ICD-10-CM | POA: Diagnosis not present

## 2021-02-26 LAB — POCT INR: INR: 1.2 — AB (ref 2.0–3.0)

## 2021-02-26 NOTE — Patient Instructions (Signed)
Take 2 tablets today and tomorrow and then continue taking 1 tablet Daily, except 1.5 tablets on Monday and Friday.  INR in 2 weeks  [phone]

## 2021-03-04 ENCOUNTER — Encounter (HOSPITAL_COMMUNITY)
Admission: RE | Admit: 2021-03-04 | Discharge: 2021-03-04 | Disposition: A | Discharge status: Home / Self Care | Payer: BC Managed Care – PPO | Source: Ambulatory Visit | Attending: Cardiovascular Disease | Admitting: Cardiovascular Disease

## 2021-03-04 ENCOUNTER — Encounter (HOSPITAL_COMMUNITY): Payer: Self-pay

## 2021-03-04 ENCOUNTER — Other Ambulatory Visit: Payer: Self-pay

## 2021-03-04 VITALS — BP 122/72 | HR 91 | Ht 69.25 in | Wt 160.3 lb

## 2021-03-04 DIAGNOSIS — Z87891 Personal history of nicotine dependence: Secondary | ICD-10-CM | POA: Insufficient documentation

## 2021-03-04 DIAGNOSIS — Z9889 Other specified postprocedural states: Secondary | ICD-10-CM | POA: Insufficient documentation

## 2021-03-04 NOTE — Progress Notes (Signed)
Cardiac Individual Treatment Plan  Patient Details  Name: Perry Lewis MRN: [id number] Date of Birth: 07-11-1966 Referring Provider:   Flowsheet Row CARDIAC REHAB PHASE II ORIENTATION from 03/04/2021 in [place]  Referring Provider Dr. Geralynn Rile, MD       Initial Encounter Date:  Elias-Fela Solis from 03/04/2021 in [place]  Date 03/04/21       Visit Diagnosis: 12/18/20 S/P mitral valve repair, minimally invasive  Patient's Home Medications on Admission:  Current Outpatient Medications:    amoxicillin (AMOXIL) 500 MG tablet, Take 4 tablets 30-60 minutes before dental procedures., Disp: 4 tablet, Rfl: 1   aspirin EC 81 MG EC tablet, Take 1 tablet (81 mg total) by mouth daily. Swallow whole., Disp: 30 tablet, Rfl: 11   folic acid (FOLVITE) 1 MG tablet, Take 2 mg by mouth daily., Disp: , Rfl:    methotrexate (RHEUMATREX) 2.5 MG tablet, Take 15 mg by mouth every Tuesday. Caution:Chemotherapy. 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/15/2020   Complex valvuloplasty including artificial Gore-tex neochord placement x10 with 34 mm Medtronic Simuform ring annuloplasty via right mini thoracotomy approach    Tobacco Use: Social History   Tobacco Use  Smoking Status Former  Smokeless Tobacco Never    Labs: Recent Review Flowsheet Data     Labs for ITP Cardiac and Pulmonary Rehab Latest Ref Rng & Units 12/15/2020 12/15/2020 12/15/2020 12/15/2020 12/15/2020   Hemoglobin A1c 4.8 - 5.6 % - - - - -   PHART 7.350 - 7.450 7.409 - - 7.311(L) 7.287(L)   PCO2ART 32.0 - 48.0 mmHg 41.6 - -  48.3(H) 51.6(H)   HCO3 20.0 - 28.0 mmol/L 26.3 - - 24.4 24.7   TCO2 22 - 32 mmol/L '28 25 24 26 26   ' ACIDBASEDEF 0.0 - 2.0 mmol/L - - - 2.0 2.0   O2SAT % 100.0 - - 100.0 99.0       Capillary Blood Glucose: Lab Results  Component Value Date   GLUCAP 102 (H) 12/17/2020   GLUCAP 117 (H) 12/17/2020   GLUCAP 153 (H) 12/17/2020   GLUCAP 96 12/17/2020   GLUCAP 111 (H) 12/17/2020     Exercise Target Goals: Exercise Program Goal: Individual exercise prescription set using results from initial 6 min walk test and THRR while considering  patient's activity barriers and safety.   Exercise Prescription Goal: Starting with aerobic activity 30 plus minutes a day, 3 days per week for initial exercise prescription. Provide home exercise prescription and guidelines that participant acknowledges understanding prior to discharge.  Activity Barriers & Risk Stratification:  Activity Barriers & Cardiac Risk Stratification - 03/04/21 1017       Activity Barriers & Cardiac Risk Stratification   Activity Barriers None    Cardiac Risk Stratification Moderate   5.24 MET's            6 Minute Walk:  6 Minute Walk     Row Name 03/04/21 1014         6 Minute Walk   Phase Initial     Distance 2021 feet  Walk Time 6 minutes     # of Rest Breaks 0     MPH 3.83     METS 5.24     RPE 15     Perceived Dyspnea  0     VO2 Peak 18.36     Symptoms No     Resting HR 91 bpm     Resting BP 122/72     Resting Oxygen Saturation  97 %     Exercise Oxygen Saturation  during 6 min walk 99 %     Max Ex. HR 113 bpm     Max Ex. BP 142/68     2 Minute Post BP 122/78              Oxygen Initial Assessment:   Oxygen Re-Evaluation:   Oxygen Discharge (Final Oxygen Re-Evaluation):   Initial Exercise Prescription:  Initial Exercise Prescription - 03/04/21 1000       Date of Initial Exercise RX and Referring Provider   Date 03/04/21    Referring Provider Dr. Geralynn Rile, MD     Expected Discharge Date 04/30/21      Treadmill   MPH 2.5    Grade 1    Minutes 15    METs 3.26      Arm Ergometer   Level 2    RPM 60    Minutes 15    METs 3.2      Prescription Details   Frequency (times per week) 3    Duration Progress to 30 minutes of continuous aerobic without signs/symptoms of physical distress      Intensity   THRR 40-80% of Max Heartrate 66-132    Ratings of Perceived Exertion 11-13    Perceived Dyspnea 0-4      Progression   Progression Continue progressive overload as per policy without signs/symptoms or physical distress.      Resistance Training   Training Prescription Yes    Weight 5    Reps 10-15             Perform Capillary Blood Glucose checks as needed.  Exercise Prescription Changes:   Exercise Comments:   Exercise Goals and Review:   Exercise Goals     Row Name 03/04/21 1124             Exercise Goals   Increase Physical Activity Yes       Intervention Provide advice, education, support and counseling about physical activity/exercise needs.;Develop an individualized exercise prescription for aerobic and resistive training based on initial evaluation findings, risk stratification, comorbidities and participant's personal goals.       Expected Outcomes Short Term: Attend rehab on a regular basis to increase amount of physical activity.;Long Term: Add in home exercise to make exercise part of routine and to increase amount of physical activity.;Long Term: Exercising regularly at least 3-5 days a week.       Increase Strength and Stamina Yes       Intervention Provide advice, education, support and counseling about physical activity/exercise needs.;Develop an individualized exercise prescription for aerobic and resistive training based on initial evaluation findings, risk stratification, comorbidities and participant's personal goals.       Expected Outcomes Short Term: Increase workloads from initial exercise  prescription for resistance, speed, and METs.;Short Term: Perform resistance training exercises routinely during rehab and add in resistance training at home;Long Term: Improve cardiorespiratory fitness, muscular endurance and strength as measured by increased METs and functional capacity (6MWT)  Able to understand and use rate of perceived exertion (RPE) scale Yes       Intervention Provide education and explanation on how to use RPE scale       Expected Outcomes Long Term:  Able to use RPE to guide intensity level when exercising independently       Knowledge and understanding of Target Heart Rate Range (THRR) Yes       Intervention Provide education and explanation of THRR including how the numbers were predicted and where they are located for reference       Expected Outcomes Short Term: Able to state/look up THRR;Long Term: Able to use THRR to govern intensity when exercising independently;Short Term: Able to use daily as guideline for intensity in rehab       Understanding of Exercise Prescription Yes       Intervention Provide education, explanation, and written materials on patient's individual exercise prescription       Expected Outcomes Short Term: Able to explain program exercise prescription;Long Term: Able to explain home exercise prescription to exercise independently                Exercise Goals Re-Evaluation :    Discharge Exercise Prescription (Final Exercise Prescription Changes):   Nutrition:  Target Goals: Understanding of nutrition guidelines, daily intake of sodium <1578m, cholesterol <2027m calories 30% from fat and 7% or less from saturated fats, daily to have 5 or more servings of fruits and vegetables.  Biometrics:  Pre Biometrics - 03/04/21 1125       Pre Biometrics   Height 5' 9.25" (1.759 m)    Weight 72.7 kg    Waist Circumference 35.25 inches    Hip Circumference 38 inches    Waist to Hip Ratio 0.93 %    BMI (Calculated) 23.5    Triceps  Skinfold 14 mm    % Body Fat 23 %    Grip Strength 41 kg    Flexibility 10.75 in    Single Leg Stand 30 seconds              Nutrition Therapy Plan and Nutrition Goals:   Nutrition Assessments:  MEDIFICTS Score Key: ?70 Need to make dietary changes  40-70 Heart Healthy Diet ? 40 Therapeutic Level Cholesterol Diet   Picture Your Plate Scores: <4<54nhealthy dietary pattern with much room for improvement. 41-50 Dietary pattern unlikely to meet recommendations for good health and room for improvement. 51-60 More healthful dietary pattern, with some room for improvement.  >60 Healthy dietary pattern, although there may be some specific behaviors that could be improved.    Nutrition Goals Re-Evaluation:   Nutrition Goals Discharge (Final Nutrition Goals Re-Evaluation):   Psychosocial: Target Goals: Acknowledge presence or absence of significant depression and/or stress, maximize coping skills, provide positive support system. Participant is able to verbalize types and ability to use techniques and skills needed for reducing stress and depression.  Initial Review & Psychosocial Screening:  Initial Psych Review & Screening - 03/04/21 0924       Initial Review   Current issues with None Identified      Family Dynamics   Good Support System? Yes   BrRobbyas his daughter and family in the area for support     Barriers   Psychosocial barriers to participate in program There are no identifiable barriers or psychosocial needs.      Screening Interventions   Interventions Encouraged to exercise  Quality of Life Scores:  Quality of Life - 03/04/21 1140       Quality of Life   Select Quality of Life      Quality of Life Scores   Health/Function Pre 21.8 %    Socioeconomic Pre 17.64 %    Psych/Spiritual Pre 18.58 %    Family Pre 18.2 %    GLOBAL Pre 19.79 %            Scores of 19 and below usually indicate a poorer quality of life in these  areas.  A difference of  2-3 points is a clinically meaningful difference.  A difference of 2-3 points in the total score of the Quality of Life Index has been associated with significant improvement in overall quality of life, self-image, physical symptoms, and general health in studies assessing change in quality of life.  PHQ-9: Recent Review Flowsheet Data     Depression screen [facility] 2/9 03/04/2021   Decreased Interest 0   Down, Depressed, Hopeless 0   PHQ - 2 Score 0      Interpretation of Total Score  Total Score Depression Severity:  1-4 = Minimal depression, 5-9 = Mild depression, 10-14 = Moderate depression, 15-19 = Moderately severe depression, 20-27 = Severe depression   Psychosocial Evaluation and Intervention:   Psychosocial Re-Evaluation:   Psychosocial Discharge (Final Psychosocial Re-Evaluation):   Vocational Rehabilitation: Provide vocational rehab assistance to qualifying candidates.   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 Participant will state understanding/return demonstration of topics presented.  Learning Barriers/Preferences:  Learning Barriers/Preferences - 03/04/21 1133       Learning Barriers/Preferences   Learning Barriers None    Learning Preferences Group Instruction;Individual Instruction;Skilled Demonstration             Education Topics: Hypertension, Hypertension Reduction -Define heart disease and high blood pressure. Discus how high blood pressure affects the body and ways to reduce high blood pressure.   Exercise and Your Heart -Discuss why it is important to exercise, the  FITT principles of exercise, normal and abnormal responses to exercise, and how to exercise safely.   Angina -Discuss definition of angina, causes of angina, treatment of angina, and how to decrease risk of having angina.   Cardiac Medications -Review what the following cardiac medications are used for, how they affect the body, and side effects that may occur when taking the medications.  Medications include Aspirin, Beta blockers, calcium channel blockers, ACE Inhibitors, angiotensin receptor blockers, diuretics, digoxin, and antihyperlipidemics.   Congestive Heart Failure -Discuss the definition of CHF, how to live with CHF, the signs and symptoms of CHF, and how keep track of weight and sodium intake.   Heart Disease and Intimacy -Discus the effect sexual activity has on the heart, how changes occur during intimacy as we age, and safety during sexual activity.   Smoking Cessation / COPD -Discuss different methods to quit smoking, the health benefits of quitting smoking, and the definition of COPD.   Nutrition I: Fats -Discuss the types of cholesterol, what cholesterol does to the heart, and how cholesterol levels  can be controlled.   Nutrition II: Labels -Discuss the different components of food labels and how to read food label   Heart Parts/Heart Disease and PAD -Discuss the anatomy of the heart, the pathway of blood circulation through the heart, and these are affected by heart disease.   Stress I: Signs and Symptoms -Discuss the causes of stress, how stress may lead to anxiety and depression, and ways to limit stress.   Stress II: Relaxation -Discuss different types of relaxation techniques to limit stress.   Warning Signs of Stroke / TIA -Discuss definition of a stroke, what the signs and symptoms are of a stroke, and how to identify when someone is having stroke.   Knowledge Questionnaire Score:  Knowledge Questionnaire Score - 03/04/21 1136        Knowledge Questionnaire Score   Pre Score 22/24             Core Components/Risk Factors/Patient Goals at Admission:  Personal Goals and Risk Factors at Admission - 03/04/21 1128       Core Components/Risk Factors/Patient Goals on Admission    Weight Management Yes;Weight Maintenance    Intervention Weight Management: Develop a combined nutrition and exercise program designed to reach desired caloric intake, while maintaining appropriate intake of nutrient and fiber, sodium and fats, and appropriate energy expenditure required for the weight goal.;Weight Management: Provide education and appropriate resources to help participant work on and attain dietary goals.    Expected Outcomes Short Term: Continue to assess and modify interventions until short term weight is achieved;Long Term: Adherence to nutrition and physical activity/exercise program aimed toward attainment of established weight goal;Weight Maintenance: Understanding of the daily nutrition guidelines, which includes 25-35% calories from fat, 7% or less cal from saturated fats, less than 230m cholesterol, less than 1.5gm of sodium, & 5 or more servings of fruits and vegetables daily    Lipids Yes    Intervention Provide education and support for participant on nutrition & aerobic/resistive exercise along with prescribed medications to achieve LDL <72m HDL >4022m   Expected Outcomes Short Term: Participant states understanding of desired cholesterol values and is compliant with medications prescribed. Participant is following exercise prescription and nutrition guidelines.;Long Term: Cholesterol controlled with medications as prescribed, with individualized exercise RX and with personalized nutrition plan. Value goals: LDL < 34m3mDL > 40 mg.    Personal Goal Other Yes    Personal Goal short term: start running Long term: run a 5 k, strength training    Intervention will continue to montor pt and progress workloads as tolerated  without sign or symptom    Expected Outcomes Pt will achieve their goals in a safe and timely manner             Core Components/Risk Factors/Patient Goals Review:    Core Components/Risk Factors/Patient Goals at Discharge (Final Review):    ITP Comments:  ITP Comments     Row Name 03/04/21 0923           ITP Comments Dr TracFransico Him Medical Director                Comments: BriaHutsonended orientation on 03/04/2021 to review rules and guidelines for program.  Completed 6 minute walk test, Intitial ITP, and exercise prescription.  VSS. Telemetry-Sinus Rhythm with arrhythmia, rare PAC. Rare PVC .  Asymptomatic. Safety measures and social distancing in place per CDC guidelines.MariBarnet Pall,BSN 03/04/2021 12:47 PM

## 2021-03-04 NOTE — Progress Notes (Signed)
Cardiac Rehab Medication Review by a Nurse  Does the patient  feel that his/her medications are working for him/her?  yes  Has the patient been experiencing any side effects to the medications prescribed?  no  Does the patient measure his/her own blood pressure or blood glucose at home?  YES   Does the patient have any problems obtaining medications due to transportation or finances?  NO  Understanding of regimen: excellent Understanding of indications: excellent Potential of compliance: excellent    Nurse comments: Jerami is taking his medications as prescribed and has a good understanding of what they are for. Mycheal says he will be completing his warfarin in the next few days.    Thayer Headings RN BSN 03/04/2021 9:25 AM

## 2021-03-08 ENCOUNTER — Encounter (HOSPITAL_COMMUNITY)
Admission: RE | Admit: 2021-03-08 | Discharge: 2021-03-08 | Disposition: A | Discharge status: Home / Self Care | Payer: BC Managed Care – PPO | Source: Ambulatory Visit | Attending: Cardiovascular Disease | Admitting: Cardiovascular Disease

## 2021-03-08 ENCOUNTER — Other Ambulatory Visit: Payer: Self-pay

## 2021-03-08 DIAGNOSIS — Z87891 Personal history of nicotine dependence: Secondary | ICD-10-CM | POA: Diagnosis not present

## 2021-03-08 DIAGNOSIS — Z9889 Other specified postprocedural states: Secondary | ICD-10-CM | POA: Diagnosis not present

## 2021-03-08 NOTE — Progress Notes (Signed)
Pt started cardiac rehab today.  Pt tolerated light exercise without difficulty. VSS, telemetry-Normal Sinus rhythm at check-in, asymptomatic.  Medication list reconciled by Gladstone Lighter RN on 03/04/2021. Pt denies barriers to medication compliance. Pt oriented to exercise equipment and routine.  Understanding verbalized.

## 2021-03-09 MED ORDER — WARFARIN SODIUM 5 MG PO TABS: 5.0000 mg | ORAL_TABLET | Freq: Every day | ORAL | 0 refills | Status: DC

## 2021-03-10 ENCOUNTER — Other Ambulatory Visit: Payer: Self-pay

## 2021-03-10 ENCOUNTER — Encounter (HOSPITAL_COMMUNITY)
Admission: RE | Admit: 2021-03-10 | Discharge: 2021-03-10 | Disposition: A | Discharge status: Home / Self Care | Payer: BC Managed Care – PPO | Source: Ambulatory Visit | Attending: Cardiovascular Disease | Admitting: Cardiovascular Disease

## 2021-03-10 DIAGNOSIS — Z9889 Other specified postprocedural states: Secondary | ICD-10-CM

## 2021-03-10 DIAGNOSIS — Z87891 Personal history of nicotine dependence: Secondary | ICD-10-CM | POA: Diagnosis not present

## 2021-03-11 NOTE — Progress Notes (Signed)
Cardiac Individual Treatment Plan  Patient Details  Name: Perry Lewis MRN: [id number] Date of Birth: [birth year] Referring Provider:   Flowsheet Row CARDIAC REHAB PHASE II ORIENTATION from 03/04/2021 in [place]  Referring Provider Dr. Geralynn Rile, MD       Initial Encounter Date:  [facility] from 03/04/2021 in [place]  Date 03/04/21       Visit Diagnosis: 12/18/20 S/P mitral valve repair, minimally invasive  Patient's Home Medications on Admission:  Current Outpatient Medications:    amoxicillin (AMOXIL) 500 MG tablet, Take 4 tablets 30-60 minutes before dental procedures., Disp: 4 tablet, Rfl: 1   aspirin EC 81 MG EC tablet, Take 1 tablet (81 mg total) by mouth daily. Swallow whole., Disp: 30 tablet, Rfl: 11   folic acid (FOLVITE) 1 MG tablet, Take 2 mg by mouth daily., Disp: , Rfl:    methotrexate (RHEUMATREX) 2.5 MG tablet, Take 15 mg by mouth every Tuesday. Caution:Chemotherapy. 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   Complex valvuloplasty including artificial Gore-tex neochord placement x10 with 34 mm Medtronic Simuform ring annuloplasty via right mini thoracotomy approach    Tobacco Use: Social History   Tobacco Use  Smoking Status Former  Smokeless Tobacco Never    Labs: Recent Review Flowsheet Data     Labs for ITP Cardiac and Pulmonary Rehab Latest Ref Rng & Units 12/15/2020 12/15/2020 12/15/2020 12/15/2020 12/15/2020   Hemoglobin A1c 4.8 - 5.6 % - - - - -   PHART 7.350 - 7.450 7.409 - - 7.311(L) 7.287(L)   PCO2ART 32.0 - 48.0 mmHg 41.6 - -  48.3(H) 51.6(H)   HCO3 20.0 - 28.0 mmol/L 26.3 - - 24.4 24.7   TCO2 22 - 32 mmol/L _0 ACIDBASEDEF 0.0 - 2.0 mmol/L - - - 2.0 2.0   O2SAT % 100.0 - - 100.0 99.0       Capillary Blood Glucose: Lab Results  Component Value Date   GLUCAP 102 (H) 12/17/2020   GLUCAP 117 (H) 12/17/2020   GLUCAP 153 (H) 12/17/2020   GLUCAP 96 12/17/2020   GLUCAP 111 (H) 12/17/2020     Exercise Target Goals: Exercise Program Goal: Individual exercise prescription set using results from initial 6 min walk test and THRR while considering  patient's activity barriers and safety.   Exercise Prescription Goal: Initial exercise prescription builds to 30-45 minutes a day of aerobic activity, 2-3 days per week.  Home exercise guidelines will be given to patient during program as part of exercise prescription that the participant will acknowledge.  Activity Barriers & Risk Stratification:  Activity Barriers & Cardiac Risk Stratification - 03/04/21 1017       Activity Barriers & Cardiac Risk Stratification   Activity Barriers None    Cardiac Risk Stratification Moderate   5.24 MET's            6 Minute Walk:  6 Minute Walk     Row Name 03/04/21 1014         6 Minute Walk   Phase Initial  Distance 2021 feet     Walk Time 6 minutes     # of Rest Breaks 0     MPH 3.83     METS 5.24     RPE 15     Perceived Dyspnea  0     VO2 Peak 18.36     Symptoms No     Resting HR 91 bpm     Resting BP 122/72     Resting Oxygen Saturation  97 %     Exercise Oxygen Saturation  during 6 min walk 99 %     Max Ex. HR 113 bpm     Max Ex. BP 142/68     2 Minute Post BP 122/78              Oxygen Initial Assessment:   Oxygen Re-Evaluation:   Oxygen Discharge (Final Oxygen Re-Evaluation):   Initial Exercise Prescription:  Initial Exercise Prescription - 03/04/21 1000       Date of Initial Exercise RX and Referring Provider   Date 03/04/21    Referring Provider Dr. Geralynn Rile, MD    Expected Discharge Date 04/30/21      Treadmill   MPH 2.5    Grade 1    Minutes 15    METs 3.26      Arm Ergometer   Level 2    RPM 60    Minutes 15    METs 3.2      Prescription Details   Frequency (times per week) 3    Duration Progress to 30 minutes of continuous aerobic without signs/symptoms of physical distress      Intensity   THRR 40-80% of Max Heartrate 66-132    Ratings of Perceived Exertion 11-13    Perceived Dyspnea 0-4      Progression   Progression Continue progressive overload as per policy without signs/symptoms or physical distress.      Resistance Training   Training Prescription Yes    Weight 5    Reps 10-15             Perform Capillary Blood Glucose checks as needed.  Exercise Prescription Changes:   Exercise Prescription Changes     Row Name 03/08/21 [phone]             Response to Exercise   Blood Pressure (Admit) 124/60       Blood Pressure (Exercise) 142/70       Blood Pressure (Exit) 118/62       Heart Rate (Admit) 91 bpm       Heart Rate (Exercise) 111 bpm       Heart Rate (Exit) 90 bpm       Rating of Perceived Exertion (Exercise) 11       Perceived Dyspnea (Exercise) 0       Symptoms 0       Comments Pt first day of exercise       Duration Progress to 30 minutes of  aerobic without signs/symptoms of physical distress       Intensity THRR unchanged               Progression   Progression Continue to progress workloads to maintain intensity without signs/symptoms of physical distress.       Average METs 3.4               Resistance Training   Training Prescription Yes       Weight 5  Reps 10-15       Time 10 Minutes               Treadmill   MPH 2.5       Grade 1       Minutes 15       METs 3.26               Arm Ergometer   Level 2       RPM 60       Minutes 15       METs 3.5                Exercise Comments:   Exercise Comments     Row Name 03/08/21 0830            Exercise Comments pt first day of exercise in the [facility] program. Pt tolerated exercise well with an average MET level of 3.4. Pt understands his RPE scale, THRR and exercise Rx. Will continue to moitor pt and progress workloads as tolerated without sign or symptom.                Exercise Goals and Review:   Exercise Goals     Row Name 03/04/21 1124             Exercise Goals   Increase Physical Activity Yes       Intervention Provide advice, education, support and counseling about physical activity/exercise needs.;Develop an individualized exercise prescription for aerobic and resistive training based on initial evaluation findings, risk stratification, comorbidities and participant's personal goals.       Expected Outcomes Short Term: Attend rehab on a regular basis to increase amount of physical activity.;Long Term: Add in home exercise to make exercise part of routine and to increase amount of physical activity.;Long Term: Exercising regularly at least 3-5 days a week.       Increase Strength and Stamina Yes       Intervention Provide advice, education, support and counseling about physical activity/exercise needs.;Develop an individualized exercise prescription for aerobic and resistive training based on initial evaluation findings, risk stratification, comorbidities and participant's personal goals.       Expected Outcomes Short Term: Increase workloads from initial exercise prescription for resistance, speed, and METs.;Short Term: Perform resistance training exercises routinely during rehab and add in resistance training at home;Long Term: Improve cardiorespiratory fitness, muscular endurance and strength as measured by increased METs and functional capacity (6MWT)       Able to understand and use rate of perceived exertion (RPE) scale Yes       Intervention Provide education and explanation on how to use RPE scale       Expected Outcomes Long Term:  Able to use RPE to guide  intensity level when exercising independently       Knowledge and understanding of Target Heart Rate Range (THRR) Yes       Intervention Provide education and explanation of THRR including how the numbers were predicted and where they are located for reference       Expected Outcomes Short Term: Able to state/look up THRR;Long Term: Able to use THRR to govern intensity when exercising independently;Short Term: Able to use daily as guideline for intensity in rehab       Understanding of Exercise Prescription Yes       Intervention Provide education, explanation, and written materials on patient's individual exercise prescription       Expected Outcomes Short Term: Able  to explain program exercise prescription;Long Term: Able to explain home exercise prescription to exercise independently                Exercise Goals Re-Evaluation :  Exercise Goals Re-Evaluation     Row Name 03/08/21 0830             Exercise Goal Re-Evaluation   Exercise Goals Review Increase Physical Activity;Increase Strength and Stamina;Able to understand and use rate of perceived exertion (RPE) scale;Knowledge and understanding of Target Heart Rate Range (THRR);Understanding of Exercise Prescription       Comments pt first day of exercise in the CRP2 program. Pt tolerated exercise well with an average MET level of 3.4. Pt understands his RPE scale, THRR and exercise Rx       Expected Outcomes Will continue to monitor pt and progress worklads as tolerated without sign or symptom                Discharge Exercise Prescription (Final Exercise Prescription Changes):  Exercise Prescription Changes - 03/08/21 0841       Response to Exercise   Blood Pressure (Admit) 124/60    Blood Pressure (Exercise) 142/70    Blood Pressure (Exit) 118/62    Heart Rate (Admit) 91 bpm    Heart Rate (Exercise) 111 bpm    Heart Rate (Exit) 90 bpm    Rating of Perceived Exertion (Exercise) 11    Perceived Dyspnea (Exercise) 0     Symptoms 0    Comments Pt first day of exercise    Duration Progress to 30 minutes of  aerobic without signs/symptoms of physical distress    Intensity THRR unchanged      Progression   Progression Continue to progress workloads to maintain intensity without signs/symptoms of physical distress.    Average METs 3.4      Resistance Training   Training Prescription Yes    Weight 5    Reps 10-15    Time 10 Minutes      Treadmill   MPH 2.5    Grade 1    Minutes 15    METs 3.26      Arm Ergometer   Level 2    RPM 60    Minutes 15    METs 3.5             Nutrition:  Target Goals: Understanding of nutrition guidelines, daily intake of sodium <1528m, cholesterol <2058m calories 30% from fat and 7% or less from saturated fats, daily to have 5 or more servings of fruits and vegetables.  Biometrics:  Pre Biometrics - 03/04/21 1125       Pre Biometrics   Height 5' 9.25" (1.759 m)    Weight 72.7 kg    Waist Circumference 35.25 inches    Hip Circumference 38 inches    Waist to Hip Ratio 0.93 %    BMI (Calculated) 23.5    Triceps Skinfold 14 mm    % Body Fat 23 %    Grip Strength 41 kg    Flexibility 10.75 in    Single Leg Stand 30 seconds              Nutrition Therapy Plan and Nutrition Goals:   Nutrition Assessments:  MEDIFICTS Score Key: ?70 Need to make dietary changes  40-70 Heart Healthy Diet ? 40 Therapeutic Level Cholesterol Diet    Picture Your Plate Scores: <4<65nhealthy dietary pattern with much room for improvement. 41-50 Dietary pattern unlikely to  meet recommendations for good health and room for improvement. 51-60 More healthful dietary pattern, with some room for improvement.  >60 Healthy dietary pattern, although there may be some specific behaviors that could be improved.    Nutrition Goals Re-Evaluation:   Nutrition Goals Re-Evaluation:   Nutrition Goals Discharge (Final Nutrition Goals  Re-Evaluation):   Psychosocial: Target Goals: Acknowledge presence or absence of significant depression and/or stress, maximize coping skills, provide positive support system. Participant is able to verbalize types and ability to use techniques and skills needed for reducing stress and depression.  Initial Review & Psychosocial Screening:  Initial Psych Review & Screening - 03/04/21 0924       Initial Review   Current issues with None Identified      Family Dynamics   Good Support System? Yes   Gautam has his daughter and family in the area for support     Barriers   Psychosocial barriers to participate in program There are no identifiable barriers or psychosocial needs.      Screening Interventions   Interventions Encouraged to exercise             Quality of Life Scores:  Quality of Life - 03/04/21 1140       Quality of Life   Select Quality of Life      Quality of Life Scores   Health/Function Pre 21.8 %    Socioeconomic Pre 17.64 %    Psych/Spiritual Pre 18.58 %    Family Pre 18.2 %    GLOBAL Pre 19.79 %            Scores of 19 and below usually indicate a poorer quality of life in these areas.  A difference of  2-3 points is a clinically meaningful difference.  A difference of 2-3 points in the total score of the Quality of Life Index has been associated with significant improvement in overall quality of life, self-image, physical symptoms, and general health in studies assessing change in quality of life.  PHQ-9: Recent Review Flowsheet Data     Depression screen [facility] 2/9 03/04/2021   Decreased Interest 0   Down, Depressed, Hopeless 0   PHQ - 2 Score 0      Interpretation of Total Score  Total Score Depression Severity:  1-4 = Minimal depression, 5-9 = Mild depression, 10-14 = Moderate depression, 15-19 = Moderately severe depression, 20-27 = Severe depression   Psychosocial Evaluation and Intervention:   Psychosocial Re-Evaluation:  Psychosocial  Re-Evaluation     [place] Name 03/11/21 1333             Psychosocial Re-Evaluation   Current issues with None Identified       Comments Will review [facility]'s quality of life questionnaire in the near future       Interventions Encouraged to attend Cardiac Rehabilitation for the exercise;Stress management education       Continue Psychosocial Services  No Follow up required                Psychosocial Discharge (Final Psychosocial Re-Evaluation):  Psychosocial Re-Evaluation - 03/11/21 1333       Psychosocial Re-Evaluation   Current issues with None Identified    Comments Will review [facility]'s quality of life questionnaire in the near future    Interventions Encouraged to attend Cardiac Rehabilitation for the exercise;Stress management education    Continue Psychosocial Services  No Follow up required  Vocational Rehabilitation: Provide vocational rehab assistance to qualifying candidates.   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 Participant will state understanding/return demonstration of topics presented.  Learning Barriers/Preferences:  Learning Barriers/Preferences - 03/04/21 1133       Learning Barriers/Preferences   Learning Barriers None    Learning Preferences Group Instruction;Individual Instruction;Skilled Demonstration             Education Topics: Count Your Pulse:  -Group instruction provided by verbal instruction, demonstration, patient participation and written materials to support subject.  Instructors address importance of being able to find your pulse and how to count your pulse  when at home without a heart monitor.  Patients get hands on experience counting their pulse with staff help and individually.   Heart Attack, Angina, and Risk Factor Modification:  -Group instruction provided by verbal instruction, video, and written materials to support subject.  Instructors address signs and symptoms of angina and heart attacks.    Also discuss risk factors for heart disease and how to make changes to improve heart health risk factors.   Functional Fitness:  -Group instruction provided by verbal instruction, demonstration, patient participation, and written materials to support subject.  Instructors address safety measures for doing things around the house.  Discuss how to get up and down off the floor, how to pick things up properly, how to safely get out of a chair without assistance, and balance training.   Meditation and Mindfulness:  -Group instruction provided by verbal instruction, patient participation, and written materials to support subject.  Instructor addresses importance of mindfulness and meditation practice to help reduce stress and improve awareness.  Instructor also leads participants through a meditation exercise.    Stretching for Flexibility and Mobility:  -Group instruction provided by verbal instruction, patient participation, and written materials to support subject.  Instructors lead participants through series of stretches that are designed to increase flexibility thus improving mobility.  These stretches are additional exercise for major muscle groups that are typically performed during regular warm up and cool down.   Hands Only CPR:  -Group verbal, video, and participation provides a basic overview of AHA guidelines for community CPR. Role-play of emergencies allow participants the opportunity to practice calling for help and chest compression technique with discussion of AED use.   Hypertension: -Group verbal and written instruction that  provides a basic overview of hypertension including the most recent diagnostic guidelines, risk factor reduction with self-care instructions and medication management.    Nutrition I class: Heart Healthy Eating:  -Group instruction provided by PowerPoint slides, verbal discussion, and written materials to support subject matter. The instructor gives an explanation and review of the Therapeutic Lifestyle Changes diet recommendations, which includes a discussion on lipid goals, dietary fat, sodium, fiber, plant stanol/sterol esters, sugar, and the components of a well-balanced, healthy diet.   Nutrition II class: Lifestyle Skills:  -Group instruction provided by PowerPoint slides, verbal discussion, and written materials to support subject matter. The instructor gives an explanation and review of label reading, grocery shopping for heart health, heart healthy recipe modifications, and ways to  make healthier choices when eating out.   Diabetes Question & Answer:  -Group instruction provided by PowerPoint slides, verbal discussion, and written materials to support subject matter. The instructor gives an explanation and review of diabetes co-morbidities, pre- and post-prandial blood glucose goals, pre-exercise blood glucose goals, signs, symptoms, and treatment of hypoglycemia and hyperglycemia, and foot care basics.   Diabetes Blitz:  -Group instruction provided by PowerPoint slides, verbal discussion, and written materials to support subject matter. The instructor gives an explanation and review of the physiology behind type 1 and type 2 diabetes, diabetes medications and rational behind using different medications, pre- and post-prandial blood glucose recommendations and Hemoglobin A1c goals, diabetes diet, and exercise including blood glucose guidelines for exercising safely.    Portion Distortion:  -Group instruction provided by PowerPoint slides, verbal discussion, written materials, and food  models to support subject matter. The instructor gives an explanation of serving size versus portion size, changes in portions sizes over the last 20 years, and what consists of a serving from each food group.   Stress Management:  -Group instruction provided by verbal instruction, video, and written materials to support subject matter.  Instructors review role of stress in heart disease and how to cope with stress positively.     Exercising on Your Own:  -Group instruction provided by verbal instruction, power point, and written materials to support subject.  Instructors discuss benefits of exercise, components of exercise, frequency and intensity of exercise, and end points for exercise.  Also discuss use of nitroglycerin and activating EMS.  Review options of places to exercise outside of rehab.  Review guidelines for sex with heart disease.   Cardiac Drugs I:  -Group instruction provided by verbal instruction and written materials to support subject.  Instructor reviews cardiac drug classes: antiplatelets, anticoagulants, beta blockers, and statins.  Instructor discusses reasons, side effects, and lifestyle considerations for each drug class.   Cardiac Drugs II:  -Group instruction provided by verbal instruction and written materials to support subject.  Instructor reviews cardiac drug classes: angiotensin converting enzyme inhibitors (ACE-I), angiotensin II receptor blockers (ARBs), nitrates, and calcium channel blockers.  Instructor discusses reasons, side effects, and lifestyle considerations for each drug class.   Anatomy and Physiology of the Circulatory System:  Group verbal and written instruction and models provide basic cardiac anatomy and physiology, with the coronary electrical and arterial systems. Review of: AMI, Angina, Valve disease, Heart Failure, Peripheral Artery Disease, Cardiac Arrhythmia, Pacemakers, and the ICD.   Other Education:  -Group or individual verbal,  written, or video instructions that support the educational goals of the cardiac rehab program.   Holiday Eating Survival Tips:  -Group instruction provided by PowerPoint slides, verbal discussion, and written materials to support subject matter. The instructor gives patients tips, tricks, and techniques to help them not only survive but enjoy the holidays despite the onslaught of food that accompanies the holidays.   Knowledge Questionnaire Score:  Knowledge Questionnaire Score - 03/04/21 1136       Knowledge Questionnaire Score   Pre Score 22/24             Core Components/Risk Factors/Patient Goals at Admission:  Personal Goals and Risk Factors at Admission - 03/04/21 1128       Core Components/Risk Factors/Patient Goals on Admission    Weight Management Yes;Weight Maintenance    Intervention Weight Management: Develop a combined nutrition and exercise program designed to reach desired caloric intake, while maintaining appropriate intake of nutrient and fiber,  sodium and fats, and appropriate energy expenditure required for the weight goal.;Weight Management: Provide education and appropriate resources to help participant work on and attain dietary goals.    Expected Outcomes Short Term: Continue to assess and modify interventions until short term weight is achieved;Long Term: Adherence to nutrition and physical activity/exercise program aimed toward attainment of established weight goal;Weight Maintenance: Understanding of the daily nutrition guidelines, which includes 25-35% calories from fat, 7% or less cal from saturated fats, less than 265m cholesterol, less than 1.5gm of sodium, & 5 or more servings of fruits and vegetables daily    Lipids Yes    Intervention Provide education and support for participant on nutrition & aerobic/resistive exercise along with prescribed medications to achieve LDL <781m HDL >4072m   Expected Outcomes Short Term: Participant states understanding  of desired cholesterol values and is compliant with medications prescribed. Participant is following exercise prescription and nutrition guidelines.;Long Term: Cholesterol controlled with medications as prescribed, with individualized exercise RX and with personalized nutrition plan. Value goals: LDL < 13m54mDL > 40 mg.    Personal Goal Other Yes    Personal Goal short term: start running Long term: run a 5 k, strength training    Intervention will continue to montor pt and progress workloads as tolerated without sign or symptom    Expected Outcomes Pt will achieve their goals in a safe and timely manner             Core Components/Risk Factors/Patient Goals Review:   Goals and Risk Factor Review     Row Name 03/08/21 1636 03/11/21 1336           Core Components/Risk Factors/Patient Goals Review   Personal Goals Review Weight Management/Obesity;Lipids;Stress Weight Management/Obesity;Lipids;Stress      Review BriaKailerrted exercise on 03/08/21 and did well with exercise. VSS BriaTamimrted exercise on 03/08/21 and is off to a good start to exercise. Vital signs have been stable      Expected Outcomes BriaOdiel continue to participate in phase 2 cardiac rehab for exercise, nutritijn and lifestyle modifications BriaLeull continue to participate in phase 2 cardiac rehab for exercise, nutritijn and lifestyle modifications               Core Components/Risk Factors/Patient Goals at Discharge (Final Review):   Goals and Risk Factor Review - 03/11/21 1336       Core Components/Risk Factors/Patient Goals Review   Personal Goals Review Weight Management/Obesity;Lipids;Stress    Review BriaBlainerted exercise on 03/08/21 and is off to a good start to exercise. Vital signs have been stable    Expected Outcomes BriaAlicel continue to participate in phase 2 cardiac rehab for exercise, nutritijn and lifestyle modifications             ITP Comments:  ITP Comments     Row Name 03/04/21  [id number] 03/11/21 1333       ITP Comments Dr TracFransico Him Medical Director 30 Day ITP Review. Brain started exercise on 03/08/21 and did well with exercise 30 Day ITP Review. Brain started exercise on 03/08/21 and is off to a good start to exercise.              Comments: See ITP comment

## 2021-03-12 ENCOUNTER — Telehealth (HOSPITAL_COMMUNITY): Payer: Self-pay | Admitting: *Deleted

## 2021-03-12 ENCOUNTER — Other Ambulatory Visit: Payer: Self-pay

## 2021-03-12 ENCOUNTER — Ambulatory Visit (INDEPENDENT_AMBULATORY_CARE_PROVIDER_SITE_OTHER): Payer: BC Managed Care – PPO

## 2021-03-12 ENCOUNTER — Encounter (HOSPITAL_COMMUNITY): Payer: BC Managed Care – PPO

## 2021-03-12 DIAGNOSIS — Z7901 Long term (current) use of anticoagulants: Secondary | ICD-10-CM

## 2021-03-12 DIAGNOSIS — Z9889 Other specified postprocedural states: Secondary | ICD-10-CM | POA: Diagnosis not present

## 2021-03-12 LAB — POCT INR: INR: 2.1 (ref 2.0–3.0)

## 2021-03-12 NOTE — Patient Instructions (Signed)
continue taking 1 tablet Daily, except 1.5 tablets on Monday and Friday.  INR in 2 weeks  [phone]

## 2021-03-12 NOTE — Telephone Encounter (Signed)
Patient called to say he overslept this morning and will be absent from cardiac rehab today.  Artist Pais, MS, ACSM CEP 03/12/2021 [phone]

## 2021-03-15 ENCOUNTER — Encounter (HOSPITAL_COMMUNITY)
Admission: RE | Admit: 2021-03-15 | Discharge: 2021-03-15 | Disposition: A | Discharge status: Home / Self Care | Payer: BC Managed Care – PPO | Source: Ambulatory Visit | Attending: Cardiovascular Disease | Admitting: Cardiovascular Disease

## 2021-03-15 ENCOUNTER — Other Ambulatory Visit: Payer: Self-pay

## 2021-03-15 DIAGNOSIS — Z87891 Personal history of nicotine dependence: Secondary | ICD-10-CM | POA: Diagnosis not present

## 2021-03-15 DIAGNOSIS — Z9889 Other specified postprocedural states: Secondary | ICD-10-CM

## 2021-03-17 ENCOUNTER — Encounter (HOSPITAL_COMMUNITY)
Admission: RE | Admit: 2021-03-17 | Discharge: 2021-03-17 | Disposition: A | Discharge status: Home / Self Care | Payer: BC Managed Care – PPO | Source: Ambulatory Visit | Attending: Cardiovascular Disease | Admitting: Cardiovascular Disease

## 2021-03-17 ENCOUNTER — Other Ambulatory Visit: Payer: Self-pay

## 2021-03-17 DIAGNOSIS — Z9889 Other specified postprocedural states: Secondary | ICD-10-CM | POA: Diagnosis not present

## 2021-03-17 DIAGNOSIS — Z87891 Personal history of nicotine dependence: Secondary | ICD-10-CM | POA: Diagnosis not present

## 2021-03-17 NOTE — Progress Notes (Signed)
QUALITY OF LIFE SCORE REVIEW Patient completed Quality of Life survey as a participant in Cardiac Rehab.  Scores 19.0 or below are considered low.  Patient scored low in several areas Overall 19.79, Health and Function 21.80, socioeconomic 17.64, physiological and spiritual 18.58, family 18.20. Patient shared his daughter is local and feels he can reach out to her if there is a need. Shared work is fine and works independently and when work was remote that was fine for him and now adjusting to return to onsite work. He feels good about his health and very satisfied with his recovery and ability to return to work so quickly. Patient exhibits positive coping skills, has a hopeful outlook with the support he needs. There are no psychosocial needs identified at this time or interventions necessary. Do not foresee any barriers to participation in Cardiac rehab. Will continue to offer support and reassurance as needed.  Will continue to monitor and intervene as necessary.

## 2021-03-19 ENCOUNTER — Other Ambulatory Visit: Payer: Self-pay

## 2021-03-19 ENCOUNTER — Encounter (HOSPITAL_COMMUNITY)
Admission: RE | Admit: 2021-03-19 | Discharge: 2021-03-19 | Disposition: A | Discharge status: Home / Self Care | Payer: BC Managed Care – PPO | Source: Ambulatory Visit | Attending: Cardiovascular Disease | Admitting: Cardiovascular Disease

## 2021-03-19 DIAGNOSIS — Z9889 Other specified postprocedural states: Secondary | ICD-10-CM | POA: Diagnosis not present

## 2021-03-19 DIAGNOSIS — M0579 Rheumatoid arthritis with rheumatoid factor of multiple sites without organ or systems involvement: Secondary | ICD-10-CM | POA: Diagnosis not present

## 2021-03-19 DIAGNOSIS — Z79899 Other long term (current) drug therapy: Secondary | ICD-10-CM | POA: Diagnosis not present

## 2021-03-19 DIAGNOSIS — Z87891 Personal history of nicotine dependence: Secondary | ICD-10-CM | POA: Diagnosis not present

## 2021-03-22 ENCOUNTER — Other Ambulatory Visit: Payer: Self-pay

## 2021-03-22 ENCOUNTER — Ambulatory Visit (INDEPENDENT_AMBULATORY_CARE_PROVIDER_SITE_OTHER): Payer: BC Managed Care – PPO

## 2021-03-22 ENCOUNTER — Encounter (HOSPITAL_COMMUNITY)
Admission: RE | Admit: 2021-03-22 | Discharge: 2021-03-22 | Disposition: A | Discharge status: Home / Self Care | Payer: BC Managed Care – PPO | Source: Ambulatory Visit | Attending: Cardiovascular Disease | Admitting: Cardiovascular Disease

## 2021-03-22 DIAGNOSIS — Z87891 Personal history of nicotine dependence: Secondary | ICD-10-CM | POA: Diagnosis not present

## 2021-03-22 DIAGNOSIS — Z9889 Other specified postprocedural states: Secondary | ICD-10-CM

## 2021-03-22 DIAGNOSIS — Z7901 Long term (current) use of anticoagulants: Secondary | ICD-10-CM

## 2021-03-22 LAB — POCT INR: INR: 2.4 (ref 2.0–3.0)

## 2021-03-22 NOTE — Patient Instructions (Signed)
Description   Last dose of Warfarin taken on 8/22.   [phone]

## 2021-03-24 ENCOUNTER — Telehealth (HOSPITAL_COMMUNITY): Payer: Self-pay

## 2021-03-24 ENCOUNTER — Encounter (HOSPITAL_COMMUNITY)
Admission: RE | Admit: 2021-03-24 | Discharge: 2021-03-24 | Disposition: A | Discharge status: Home / Self Care | Payer: BC Managed Care – PPO | Source: Ambulatory Visit | Attending: Cardiovascular Disease | Admitting: Cardiovascular Disease

## 2021-03-24 ENCOUNTER — Other Ambulatory Visit: Payer: Self-pay

## 2021-03-24 DIAGNOSIS — Z87891 Personal history of nicotine dependence: Secondary | ICD-10-CM | POA: Diagnosis not present

## 2021-03-24 DIAGNOSIS — Z9889 Other specified postprocedural states: Secondary | ICD-10-CM | POA: Diagnosis not present

## 2021-03-24 NOTE — Progress Notes (Signed)
CARDIAC REHAB PHASE 2  Reviewed home Ex Rx with pt today. Pt is tolerating exercise well with an average MET level of 4.49.  Pt is currently meeting ACSM guidlines by walking 2-3 days and biking 2-3 days, each session about 30 minutes, in addition to his 3 days in CRP2. Pt has received jogging clearance from Dr. Audie Box and he looks forward to adding this into his workout routine. Encouraged warm up, cool down and stretches. Informed PT of S/S to stop exercise and when to call MD vs 911. Reviewed THRR and RPE parameters. Also talked about Temperature precautions and hydration with outdoor activities Pt verbalized understanding, all questions were answered and pt was given a copy.  Kirby Funk ACSM-EP 03/24/2021 10:28 AM

## 2021-03-24 NOTE — Telephone Encounter (Signed)
-----  Message from Geralynn Rile, MD sent at 03/23/2021  6:49 AM EDT ----- Regarding: RE: Target Heart Rate Increase and jogging Ok to increase as much as he needs.   [place] T. Audie Box, MD, [place]  [address], [place], [place] [address] [phone]  6:49 AM  ----- Message ----- From: Kirby Funk Sent: 03/22/2021   9:23 AM EDT To: Geralynn Rile, MD Subject: Target Heart Rate Increase and jogging         CARDIAC REHAB PHASE 2  Your patient Perry Lewis has been in the cardiac rehabilitation program approximately 2 weeks and is doing well. Patient would like to begin jogging. His current MET level is 4.0. Please indicate if you are agreeable to this change in exercise prescription.  Blood pressure is within normal limits, but exercise heart rates will likely exceed Target Heart Rate Range(THRR) of 66-132 bpm (40%-80% of age predicted max HR).  If no GXT is planned for the near future and MD agrees, request to increase THR to 40% -90% of age predicted max HR 66-149 bpm.   We appreciate your assistance and referral of your patient to our program.  Kirby Funk ACSM-EP 03/22/2021 9:19 AM

## 2021-03-26 ENCOUNTER — Other Ambulatory Visit: Payer: Self-pay

## 2021-03-26 ENCOUNTER — Encounter (HOSPITAL_COMMUNITY)
Admission: RE | Admit: 2021-03-26 | Discharge: 2021-03-26 | Disposition: A | Discharge status: Home / Self Care | Payer: BC Managed Care – PPO | Source: Ambulatory Visit | Attending: Cardiovascular Disease | Admitting: Cardiovascular Disease

## 2021-03-26 DIAGNOSIS — Z9889 Other specified postprocedural states: Secondary | ICD-10-CM | POA: Diagnosis not present

## 2021-03-26 DIAGNOSIS — Z87891 Personal history of nicotine dependence: Secondary | ICD-10-CM | POA: Diagnosis not present

## 2021-03-29 ENCOUNTER — Other Ambulatory Visit: Payer: Self-pay

## 2021-03-29 ENCOUNTER — Encounter (HOSPITAL_COMMUNITY)
Admission: RE | Admit: 2021-03-29 | Discharge: 2021-03-29 | Disposition: A | Discharge status: Home / Self Care | Payer: BC Managed Care – PPO | Source: Ambulatory Visit | Attending: Cardiovascular Disease | Admitting: Cardiovascular Disease

## 2021-03-29 DIAGNOSIS — Z9889 Other specified postprocedural states: Secondary | ICD-10-CM

## 2021-03-29 DIAGNOSIS — Z87891 Personal history of nicotine dependence: Secondary | ICD-10-CM | POA: Diagnosis not present

## 2021-03-31 ENCOUNTER — Encounter (HOSPITAL_COMMUNITY)
Admission: RE | Admit: 2021-03-31 | Discharge: 2021-03-31 | Disposition: A | Discharge status: Home / Self Care | Payer: BC Managed Care – PPO | Source: Ambulatory Visit | Attending: Cardiovascular Disease | Admitting: Cardiovascular Disease

## 2021-03-31 ENCOUNTER — Other Ambulatory Visit: Payer: Self-pay

## 2021-03-31 DIAGNOSIS — Z9889 Other specified postprocedural states: Secondary | ICD-10-CM | POA: Diagnosis not present

## 2021-03-31 DIAGNOSIS — Z87891 Personal history of nicotine dependence: Secondary | ICD-10-CM | POA: Diagnosis not present

## 2021-04-02 ENCOUNTER — Encounter (HOSPITAL_COMMUNITY)
Admission: RE | Admit: 2021-04-02 | Discharge: 2021-04-02 | Disposition: A | Discharge status: Home / Self Care | Payer: BC Managed Care – PPO | Source: Ambulatory Visit | Attending: Cardiovascular Disease | Admitting: Cardiovascular Disease

## 2021-04-02 ENCOUNTER — Other Ambulatory Visit: Payer: Self-pay

## 2021-04-02 DIAGNOSIS — Z9889 Other specified postprocedural states: Secondary | ICD-10-CM | POA: Insufficient documentation

## 2021-04-07 ENCOUNTER — Other Ambulatory Visit: Payer: Self-pay

## 2021-04-07 ENCOUNTER — Encounter (HOSPITAL_COMMUNITY)
Admission: RE | Admit: 2021-04-07 | Discharge: 2021-04-07 | Disposition: A | Discharge status: Home / Self Care | Payer: BC Managed Care – PPO | Source: Ambulatory Visit | Attending: Cardiovascular Disease | Admitting: Cardiovascular Disease

## 2021-04-07 DIAGNOSIS — Z9889 Other specified postprocedural states: Secondary | ICD-10-CM

## 2021-04-09 ENCOUNTER — Other Ambulatory Visit: Payer: Self-pay

## 2021-04-09 ENCOUNTER — Encounter (HOSPITAL_COMMUNITY)
Admission: RE | Admit: 2021-04-09 | Discharge: 2021-04-09 | Disposition: A | Discharge status: Home / Self Care | Payer: BC Managed Care – PPO | Source: Ambulatory Visit | Attending: Cardiovascular Disease | Admitting: Cardiovascular Disease

## 2021-04-09 DIAGNOSIS — Z9889 Other specified postprocedural states: Secondary | ICD-10-CM | POA: Diagnosis not present

## 2021-04-09 NOTE — Progress Notes (Signed)
At 7:21am this morning spoke with Perry Page, NP regarding Perry Lewis's rhythm at the onset of his Cardiac rehab exercise this morning while doing a slow jog on the treadmill.  Noted several frequent PVC's, pairs, and a 5- beat run. Had instructed Perry Lewis to slow to a walk, he said he felt fine and takes no medications in the morning and said has his usual routine with 2 cups of coffee and some water this morning.  Check-in BP 120/60.  Patient had reported no symptoms and felt fine. Allowed to proceed with light exercise for remainder of session. Tolerated light exercise well and had occasional single PVCs the remainder of the time. Will fax rhythm tracings to Perry Lewis.  Perry Page NP to arrange an office appointment and labs.  Informed Perry Lewis of the follow-up and instructed if any changes or concerns to contact Perry Lewis. Understanding voiced.

## 2021-04-12 ENCOUNTER — Encounter (HOSPITAL_COMMUNITY): Payer: BC Managed Care – PPO

## 2021-04-14 ENCOUNTER — Encounter (HOSPITAL_COMMUNITY)
Admission: RE | Admit: 2021-04-14 | Discharge: 2021-04-14 | Disposition: A | Discharge status: Home / Self Care | Payer: BC Managed Care – PPO | Source: Ambulatory Visit | Attending: Cardiovascular Disease | Admitting: Cardiovascular Disease

## 2021-04-14 ENCOUNTER — Other Ambulatory Visit: Payer: Self-pay

## 2021-04-14 DIAGNOSIS — Z9889 Other specified postprocedural states: Secondary | ICD-10-CM

## 2021-04-14 NOTE — Progress Notes (Signed)
Cardiology Office Note:    Date:  04/21/2021   ID:  Perry Lewis, DOB 1965/12/25, MRN [id number]  PCP:  Eartha Inch, MD   [facility] Providers Cardiologist:  Reatha Harps, MD      Referring MD: Eartha Inch, MD   Seen for evaluation of PVCs  History of Present Illness:    Perry Lewis is a 55 y.o. male with a hx of mitral valve regurgitation status post mitral valve repair 12/15/2020, postoperative atrial fibrillation, RA, and HLD.  He was seen by Dr. Bufford Buttner on 02/05/2021 postoperatively.  His echocardiogram showed residual Sam with LVOT.  This was more prominent with Valsalva maneuver.  He denied symptoms.  His metoprolol was stopped.  Plan for repeat echocardiogram in 1 year was made.  He was continued on 81 mg aspirin.  He was continued on 1 month of Coumadin therapy with plans to stop after.  He expressed understanding of SBE prophylaxis.  Overall he was doing well.  He was noted to have minimal/trace pleural effusion.  Postoperatively he was given 1 month of amiodarone therapy.  He plans to monitor his heart rhythm with his mobile device.  Plans for follow-up were made for 6 months.  He was noted to have increased PVCs during cardiac rehab.  He presents to the clinic today for evaluation and states he does not notice palpitations or extra heartbeats.  He reports that he was on the treadmill at cardiac rehab, had done a 10-minute walk, and started to run, when cardiac rehab nurses stopped him due to what was observed on his telemetry.  His EKG today shows normal sinus rhythm 84 bpm.  At home he has been using his bicycle and riding 3 to 4 miles 3 to 4 days/week.  He does not notice any dizziness, lightheadedness, or palpitations with this physical activity.  He reports that he also enjoys using the rowing machine.  We reviewed options for evaluation.  We will order a cardiac event monitor to evaluate his PVCs.  I have asked him to maintain his physical  activity, continue his current diet, and follow-up in 6 months or sooner if management is needed.  Today he denies chest pain, shortness of breath, lower extremity edema, fatigue, palpitations, melena, hematuria, hemoptysis, diaphoresis, weakness, presyncope, syncope, orthopnea, and PND.   Past Medical History:  Diagnosis Date   Mitral regurgitation    Mitral valve prolapse    Rheumatoid arthritis (HCC)    S/P minimally invasive mitral valve repair 12/15/2020   Complex valvuloplasty including artificial Gore-tex neochord placement x10 with 34 mm Medtronic Simuform ring annuloplasty via right mini thoracotomy approach    Past Surgical History:  Procedure Laterality Date   BUBBLE STUDY  10/26/2020   Procedure: BUBBLE STUDY;  Surgeon: Sande Rives, MD;  Location: [facility] ENDOSCOPY;  Service: Cardiovascular;;   HERNIA REPAIR     LAPAROSCOPIC APPENDECTOMY N/A 11/27/2018   Procedure: APPENDECTOMY LAPAROSCOPIC;  Surgeon: Gaynelle Adu, MD;  Location: [facility] ORS;  Service: General;  Laterality: N/A;   MITRAL VALVE REPAIR Right 12/15/2020   Procedure: MINIMALLY INVASIVE MITRAL VALVE REPAIR (MVR);  Surgeon: Purcell Nails, MD;  Location: [facility] OR;  Service: Open Heart Surgery;  Laterality: Right;   TEE WITHOUT CARDIOVERSION N/A 10/26/2020   Procedure: TRANSESOPHAGEAL ECHOCARDIOGRAM (TEE);  Surgeon: Sande Rives, MD;  Location: [facility] ENDOSCOPY;  Service: Cardiovascular;  Laterality: N/A;   TEE WITHOUT CARDIOVERSION N/A 12/15/2020   Procedure: TRANSESOPHAGEAL ECHOCARDIOGRAM (TEE);  Surgeon:  Purcell Nails, MD;  Location: [facility] OR;  Service: Open Heart Surgery;  Laterality: N/A;    Current Medications: Current Meds  Medication Sig   amoxicillin (AMOXIL) 500 MG tablet Take 4 tablets 30-60 minutes before dental procedures.   aspirin EC 81 MG EC tablet Take 1 tablet (81 mg total) by mouth daily. Swallow whole.   folic acid (FOLVITE) 1 MG tablet Take 2 mg by mouth daily.   methotrexate (RHEUMATREX)  2.5 MG tablet Take 15 mg by mouth every Tuesday. Caution:Chemotherapy. Protect from light.     Allergies:   Patient has no known allergies.   Social History   Socioeconomic History   Marital status: Single    Spouse name: Not on file   Number of children: 1   Years of education: Not on file   Highest education level: Not on file  Occupational History   Occupation: Adult nurse  Tobacco Use   Smoking status: Former   Smokeless tobacco: Never  Building services engineer Use: Never used  Substance and Sexual Activity   Alcohol use: Yes    Comment: 2x week   Drug use: Never   Sexual activity: Not Currently  Other Topics Concern   Not on file  Social History Narrative   Not on file   Social Determinants of Health   Financial Resource Strain: Not on file  Food Insecurity: Not on file  Transportation Needs: Not on file  Physical Activity: Not on file  Stress: Not on file  Social Connections: Not on file     Family History: The patient's family history is not on file.  ROS:   Please see the history of present illness.     All other systems reviewed and are negative.   Risk Assessment/Calculations:           Physical Exam:    VS:  BP 124/72   Pulse 84   Ht 5' 9.25" (1.759 m)   Wt 163 lb 12.8 oz (74.3 kg)   BMI 24.01 kg/m     Wt Readings from Last 3 Encounters:  04/21/21 163 lb 12.8 oz (74.3 kg)  03/04/21 160 lb 4.4 oz (72.7 kg)  02/05/21 159 lb 6.4 oz (72.3 kg)     GEN:  Well nourished, well developed in no acute distress HEENT: Normal NECK: No JVD; No carotid bruits LYMPHATICS: No lymphadenopathy CARDIAC: RRR, no murmurs, rubs, gallops RESPIRATORY:  Clear to auscultation without rales, wheezing or rhonchi  ABDOMEN: Soft, non-tender, non-distended MUSCULOSKELETAL:  No edema; No deformity  SKIN: Warm and dry NEUROLOGIC:  Alert and oriented x 3 PSYCHIATRIC:  Normal affect    EKGs/Labs/Other Studies Reviewed:    The following studies were  reviewed today:  Echocardiogram 02/03/2021  IMPRESSIONS     1. There is systolic anterior motion of the mitral valve leaflets  (largely posterior). There is no significant LVOT obstructoin (peak  gradient ~9 mmHG). No provocative maneuvers performed. Left ventricular  ejection fraction, by estimation, is 55 to 60%.   The left ventricle has normal function. The left ventricle has no  regional wall motion abnormalities. Left ventricular diastolic function  could not be evaluated.   2. 34 mm Medtronic Simuform annuloplasty ring in the MV position  (12/15/2020). The mitral valve has been repaired/replaced. No evidence of  mitral valve regurgitation. The mean mitral valve gradient is 4.7 mmHg  with average heart rate of 74 bpm. There is  a 34 prosthetic annuloplasty ring present in the  mitral position.  Procedure Date: 12/15/2020.   3. Right ventricular systolic function is normal. The right ventricular  size is normal. There is normal pulmonary artery systolic pressure. The  estimated right ventricular systolic pressure is 22.4 mmHg.   4. Left atrial size was mild to moderately dilated.   5. The aortic valve is tricuspid. Aortic valve regurgitation is not  visualized. No aortic stenosis is present.   6. The inferior vena cava is normal in size with greater than 50%  respiratory variability, suggesting right atrial pressure of 3 mmHg.   Comparison(s): No significant change from prior study.   EKG:  EKG is  ordered today.  The ekg ordered today demonstrates normal sinus rhythm no ST or T wave deviation 84 bpm  Recent Labs: 12/16/2020: Magnesium 2.1 01/01/2021: ALT 57; BUN 9; Creatinine, Ser 0.74; Hemoglobin 10.4; Platelets 697; Potassium 4.9; Sodium 135; TSH 1.700  Recent Lipid Panel No results found for: CHOL, TRIG, HDL, CHOLHDL, VLDL, LDLCALC, LDLDIRECT  ASSESSMENT & PLAN    Palpitations-EKG today shows Normal sinus rhythm 84 bpm.  Was previously noted to have increased PVCs during  cardiac rehab.   Denies presyncope or syncope.  Reports he is maintaining hydration.  Continues to be very physically active at home.  He denies sensation of palpitations. Order 7-day cardiac event monitor Heart healthy low-sodium diet-salty 6 given Increase physical activity as tolerated  Mitral valve insufficiency-status post mitral valve repair 12/15/2020.  Continues to increase his physical activity.  No increased DOE or activity intolerance.  Euvolemic. Continue SBE prophylaxis Continue aspirin Heart healthy low-sodium diet Increase physical activity as tolerated  Dizziness-notices feeling of dizziness when moving from seated to standing position. Increase p.o. hydration-May use sports drinks  Follow-up with Dr. Flora Lipps in 6 months.       Medication Adjustments/Labs and Tests Ordered: Current medicines are reviewed at length with the patient today.  Concerns regarding medicines are outlined above.  Orders Placed This Encounter  Procedures   LONG TERM MONITOR (3-14 DAYS)   EKG 12-Lead   No orders of the defined types were placed in this encounter.   Patient Instructions  Medication Instructions:  Your physician recommends that you continue on your current medications as directed. Please refer to the Current Medication list given to you today.  *If you need a refill on your cardiac medications before your next appointment, please call your pharmacy*    Testing/Procedures: Your physician has recommended that you wear a 7 day Zio XT monitor.   This monitor is a medical device that records the heart's electrical activity. Doctors most often use these monitors to diagnose arrhythmias. Arrhythmias are problems with the speed or rhythm of the heartbeat. The monitor is a small device applied to your chest. You can wear one while you do your normal daily activities. While wearing this monitor if you have any symptoms to push the button and record what you felt. Once you have worn  this monitor for the period of time provider prescribed (Usually 14 days), you will return the monitor device in the postage paid box. Once it is returned they will download the data collected and provide [place] with a report which the provider will then review and we will call you with those results. Important tips:  Avoid showering during the first 24 hours of wearing the monitor. Avoid excessive sweating to help maximize wear time. Do not submerge the device, no hot tubs, and no swimming pools. Keep any lotions or oils away  from the patch. After 24 hours you may shower with the patch on. Take brief showers with your back facing the shower head.  Do not remove patch once it has been placed because that will interrupt data and decrease adhesive wear time. Push the button when you have any symptoms and write down what you were feeling. Once you have completed wearing your monitor, remove and place into box which has postage paid and place in your outgoing mailbox.  If for some reason you have misplaced your box then call our office and we can provide another box and/or mail it off for you.   Follow-Up: At [facility], you and your health needs are our priority.  As part of our continuing mission to provide you with exceptional heart care, we have created designated Provider Care Teams.  These Care Teams include your primary Cardiologist (physician) and Advanced Practice Providers (APPs -  Physician Assistants and Nurse Practitioners) who all work together to provide you with the care you need, when you need it.  We recommend signing up for the patient portal called "MyChart".  Sign up information is provided on this After Visit Summary.  MyChart is used to connect with patients for Virtual Visits (Telemedicine).  Patients are able to view lab/test results, encounter notes, upcoming appointments, etc.  Non-urgent messages can be sent to your provider as well.   To learn more about what you can do with  MyChart, go to [email or web address].    Your next appointment:   6 month(s)  The format for your next appointment:   In Person  Provider:   You may see Dr. Flora Lipps or one of the following Advanced Practice Providers on your designated Care Team:   Edd Fabian, NP   Other Instructions Verdon Cummins, NP has recommended continuing your diet and physical activity. He has also recommended increasing your hydration, ok to drink sports drinks like Gatorade and Propel.   Signed, Ronney Asters, NP  04/21/2021 12:14 PM      Notice: This dictation was prepared with Dragon dictation along with smaller phrase technology. Any transcriptional errors that result from this process are unintentional and may not be corrected upon review.  I spent 14 minutes examining this patient, reviewing medications, and using patient centered shared decision making involving her cardiac care.  Prior to her visit I spent greater than 20 minutes reviewing her past medical history,  medications, and prior cardiac tests.

## 2021-04-14 NOTE — Progress Notes (Deleted)
Cardiac Individual Treatment Plan  Patient Details  Name: Perry Lewis MRN: [id number] Date of Birth: May 18, 1966 Referring Provider:   Flowsheet Row CARDIAC REHAB PHASE II ORIENTATION from 03/04/2021 in [place]  Referring Provider Dr. Geralynn Rile, MD       Initial Encounter Date:  [place] from 03/04/2021 in [place]  Date 03/04/21       Visit Diagnosis: 12/18/20 S/P mitral valve repair, minimally invasive  Patient's Home Medications on Admission:  Current Outpatient Medications:    amoxicillin (AMOXIL) 500 MG tablet, Take 4 tablets 30-60 minutes before dental procedures., Disp: 4 tablet, Rfl: 1   aspirin EC 81 MG EC tablet, Take 1 tablet (81 mg total) by mouth daily. Swallow whole., Disp: 30 tablet, Rfl: 11   folic acid (FOLVITE) 1 MG tablet, Take 2 mg by mouth daily., Disp: , Rfl:    methotrexate (RHEUMATREX) 2.5 MG tablet, Take 15 mg by mouth every Tuesday. Caution:Chemotherapy. 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   Complex valvuloplasty including artificial Gore-tex neochord placement x10 with 34 mm Medtronic Simuform ring annuloplasty via right mini thoracotomy approach    Tobacco Use: Social History   Tobacco Use  Smoking Status Former  Smokeless Tobacco Never    Labs: Recent Review Flowsheet Data     Labs for ITP Cardiac and Pulmonary Rehab Latest Ref Rng & Units 12/15/2020 12/15/2020 12/15/2020 12/15/2020 12/15/2020   Hemoglobin A1c 4.8 - 5.6 % - - - - -   PHART 7.350 - 7.450 7.409 - - 7.311(L) 7.287(L)   PCO2ART 32.0 - 48.0 mmHg 41.6 - -  48.3(H) 51.6(H)   HCO3 20.0 - 28.0 mmol/L 26.3 - - 24.4 24.7   TCO2 22 - 32 mmol/L _0 ACIDBASEDEF 0.0 - 2.0 mmol/L - - - 2.0 2.0   O2SAT % 100.0 - - 100.0 99.0       Capillary Blood Glucose: Lab Results  Component Value Date   GLUCAP 102 (H) 12/17/2020   GLUCAP 117 (H) 12/17/2020   GLUCAP 153 (H) 12/17/2020   GLUCAP 96 12/17/2020   GLUCAP 111 (H) 12/17/2020     Exercise Target Goals: Exercise Program Goal: Individual exercise prescription set using results from initial 6 min walk test and THRR while considering  patient's activity barriers and safety.   Exercise Prescription Goal: Starting with aerobic activity 30 plus minutes a day, 3 days per week for initial exercise prescription. Provide home exercise prescription and guidelines that participant acknowledges understanding prior to discharge.  Activity Barriers & Risk Stratification:  Activity Barriers & Cardiac Risk Stratification - 03/04/21 1017       Activity Barriers & Cardiac Risk Stratification   Activity Barriers None    Cardiac Risk Stratification Moderate   5.24 MET's            6 Minute Walk:  6 Minute Walk     Row Name 03/04/21 1014         6 Minute Walk   Phase Initial     Distance 2021 feet  Walk Time 6 minutes     # of Rest Breaks 0     MPH 3.83     METS 5.24     RPE 15     Perceived Dyspnea  0     VO2 Peak 18.36     Symptoms No     Resting HR 91 bpm     Resting BP 122/72     Resting Oxygen Saturation  97 %     Exercise Oxygen Saturation  during 6 min walk 99 %     Max Ex. HR 113 bpm     Max Ex. BP 142/68     2 Minute Post BP 122/78              Oxygen Initial Assessment:   Oxygen Re-Evaluation:   Oxygen Discharge (Final Oxygen Re-Evaluation):   Initial Exercise Prescription:  Initial Exercise Prescription - 03/04/21 1000       Date of Initial Exercise RX and Referring Provider   Date 03/04/21    Referring Provider Dr. Geralynn Rile, MD     Expected Discharge Date 04/30/21      Treadmill   MPH 2.5    Grade 1    Minutes 15    METs 3.26      Arm Ergometer   Level 2    RPM 60    Minutes 15    METs 3.2      Prescription Details   Frequency (times per week) 3    Duration Progress to 30 minutes of continuous aerobic without signs/symptoms of physical distress      Intensity   THRR 40-80% of Max Heartrate 66-132    Ratings of Perceived Exertion 11-13    Perceived Dyspnea 0-4      Progression   Progression Continue progressive overload as per policy without signs/symptoms or physical distress.      Resistance Training   Training Prescription Yes    Weight 5    Reps 10-15             Perform Capillary Blood Glucose checks as needed.  Exercise Prescription Changes:   Exercise Prescription Changes     Row Name 03/08/21 0841 03/22/21 0830 04/09/21 0833         Response to Exercise   Blood Pressure (Admit) 124/60 118/58 120/60     Blood Pressure (Exercise) 142/70 148/78 134/64     Blood Pressure (Exit) 118/62 110/70 120/74     Heart Rate (Admit) 91 bpm 75 bpm 93 bpm     Heart Rate (Exercise) 111 bpm 118 bpm 126 bpm     Heart Rate (Exit) 90 bpm 85 bpm 98 bpm     Rating of Perceived Exertion (Exercise) _0 Perceived Dyspnea (Exercise) 0 0 0     Symptoms 0 0 0     Comments Pt first day of exercise Reviewed MET's, goals and home exercise Reviewed MET's     Duration Progress to 30 minutes of  aerobic without signs/symptoms of physical distress Progress to 30 minutes of  aerobic without signs/symptoms of physical distress Progress to 30 minutes of  aerobic without signs/symptoms of physical distress     Intensity THRR unchanged THRR unchanged THRR unchanged           Progression   Progression Continue to progress workloads to maintain intensity without signs/symptoms of physical distress. Continue to progress workloads to maintain intensity without signs/symptoms of physical distress. Continue  to  progress workloads to maintain intensity without signs/symptoms of physical distress.     Average METs 3.4 4.49 5.33           Resistance Training   Training Prescription Yes No Yes     Weight 5 -- 6     Reps 10-15 -- 10-15     Time 10 Minutes -- 10 Minutes           Treadmill   MPH 2.5 3.2 3.5     Grade 1 1 0     Minutes _0 METs 3.26 4.77 3.68           Arm Ergometer   Level 2 3 --     RPM 60 60 --     Minutes 15 15 --     METs 3.5 4.2 --           Rower   Level -- -- 5     Watts -- -- 34     Minutes -- -- 15     METs -- -- 6.98           Home Exercise Plan   Plans to continue exercise at -- Home (comment) Home (comment)     Frequency -- Add 4 additional days to program exercise sessions. Add 4 additional days to program exercise sessions.     Initial Home Exercises Provided -- 03/22/21 03/22/21              Exercise Comments:   Exercise Comments     Row Name 03/08/21 0830 03/22/21 0830 04/09/21 0836       Exercise Comments pt first day of exercise in the [facility] program. Pt tolerated exercise well with an average MET level of 3.4. Pt understands his RPE scale, THRR and exercise Rx. Will continue to moitor pt and progress workloads as tolerated without sign or symptom. Reviwed MET's goals and home Ex Rx. Pt is tolerating exercise well with an average MET level of 4.49. Pt goals are to run again and strength training. Pt states he feels comfortable and on track. Will be adding in jogging with cardiologist approval soon. Pt is currently meeting ACSM guidlines by walking 2-3 days and biking 2-3 days, each session about 30 minutes, in addition to his 3 days in [place]. Pt is tolerating exercise well with an average MET level of 5.33. Will continue to monitor pt and progress workloads as tolerated without sign or symptom.              Exercise Goals and Review:   Exercise Goals     Row Name 03/04/21 1124             Exercise Goals    Increase Physical Activity Yes       Intervention Provide advice, education, support and counseling about physical activity/exercise needs.;Develop an individualized exercise prescription for aerobic and resistive training based on initial evaluation findings, risk stratification, comorbidities and participant's personal goals.       Expected Outcomes Short Term: Attend rehab on a regular basis to increase amount of physical activity.;Long Term: Add in home exercise to make exercise part of routine and to increase amount of physical activity.;Long Term: Exercising regularly at least 3-5 days a week.       Increase Strength and Stamina Yes       Intervention Provide advice, education, support and counseling about physical activity/exercise needs.;Develop an individualized exercise prescription for  aerobic and resistive training based on initial evaluation findings, risk stratification, comorbidities and participant's personal goals.       Expected Outcomes Short Term: Increase workloads from initial exercise prescription for resistance, speed, and METs.;Short Term: Perform resistance training exercises routinely during rehab and add in resistance training at home;Long Term: Improve cardiorespiratory fitness, muscular endurance and strength as measured by increased METs and functional capacity (6MWT)       Able to understand and use rate of perceived exertion (RPE) scale Yes       Intervention Provide education and explanation on how to use RPE scale       Expected Outcomes Long Term:  Able to use RPE to guide intensity level when exercising independently       Knowledge and understanding of Target Heart Rate Range (THRR) Yes       Intervention Provide education and explanation of THRR including how the numbers were predicted and where they are located for reference       Expected Outcomes Short Term: Able to state/look up THRR;Long Term: Able to use THRR to govern intensity when exercising  independently;Short Term: Able to use daily as guideline for intensity in rehab       Understanding of Exercise Prescription Yes       Intervention Provide education, explanation, and written materials on patient's individual exercise prescription       Expected Outcomes Short Term: Able to explain program exercise prescription;Long Term: Able to explain home exercise prescription to exercise independently                Exercise Goals Re-Evaluation :  Exercise Goals Re-Evaluation     [place] Name 03/08/21 0830 03/22/21 0830           Exercise Goal Re-Evaluation   Exercise Goals Review Increase Physical Activity;Increase Strength and Stamina;Able to understand and use rate of perceived exertion (RPE) scale;Knowledge and understanding of Target Heart Rate Range (THRR);Understanding of Exercise Prescription Increase Physical Activity;Increase Strength and Stamina;Able to understand and use rate of perceived exertion (RPE) scale;Knowledge and understanding of Target Heart Rate Range (THRR);Understanding of Exercise Prescription      Comments pt first day of exercise in the [facility] program. Pt tolerated exercise well with an average MET level of 3.4. Pt understands his RPE scale, THRR and exercise Rx Reviwed MET's goals and home Ex Rx. Pt is tolerating exercise well with an average MET level of 4.49. Pt goals are to run again and strength training. Pt states he feels comfortable and on track. Will be adding in jogging with cardiologist approval soon. Pt is currently meeting ACSM guidlines by walking 2-3 days and biking 2-3 days, each session about 30 minutes, in addition to his 3 days in [facility].      Expected Outcomes Will continue to monitor pt and progress worklads as tolerated without sign or symptom Will continue to monitor pt and progress worklads as tolerated without sign or symptom                Discharge Exercise Prescription (Final Exercise Prescription Changes):  Exercise Prescription  Changes - 04/09/21 0833       Response to Exercise   Blood Pressure (Admit) 120/60    Blood Pressure (Exercise) 134/64    Blood Pressure (Exit) 120/74    Heart Rate (Admit) 93 bpm    Heart Rate (Exercise) 126 bpm    Heart Rate (Exit) 98 bpm    Rating of Perceived Exertion (Exercise) 13  Perceived Dyspnea (Exercise) 0    Symptoms 0    Comments Reviewed MET's    Duration Progress to 30 minutes of  aerobic without signs/symptoms of physical distress    Intensity THRR unchanged      Progression   Progression Continue to progress workloads to maintain intensity without signs/symptoms of physical distress.    Average METs 5.33      Resistance Training   Training Prescription Yes    Weight 6    Reps 10-15    Time 10 Minutes      Treadmill   MPH 3.5    Grade 0    Minutes 15    METs 3.68      Rower   Level 5    Watts 34    Minutes 15    METs 6.98      Home Exercise Plan   Plans to continue exercise at Home (comment)    Frequency Add 4 additional days to program exercise sessions.    Initial Home Exercises Provided 03/22/21             Nutrition:  Target Goals: Understanding of nutrition guidelines, daily intake of sodium <1514m, cholesterol <2048m calories 30% from fat and 7% or less from saturated fats, daily to have 5 or more servings of fruits and vegetables.  Biometrics:  Pre Biometrics - 03/04/21 1125       Pre Biometrics   Height 5' 9.25" (1.759 m)    Weight 72.7 kg    Waist Circumference 35.25 inches    Hip Circumference 38 inches    Waist to Hip Ratio 0.93 %    BMI (Calculated) 23.5    Triceps Skinfold 14 mm    % Body Fat 23 %    Grip Strength 41 kg    Flexibility 10.75 in    Single Leg Stand 30 seconds              Nutrition Therapy Plan and Nutrition Goals:  Nutrition Therapy & Goals - 04/12/21 0929       Nutrition Therapy   RD appointment deferred Yes             Nutrition Assessments:  MEDIFICTS Score Key: ?70 Need to  make dietary changes  40-70 Heart Healthy Diet ? 40 Therapeutic Level Cholesterol Diet  Flowsheet Row CARDIAC REHAB [facility] [place] [facility] [place] on [place] 60      Picture Your Plate Scores: <4<38nhealthy dietary pattern with much room for improvement. 41-50 Dietary pattern unlikely to meet recommendations for good health and room for improvement. 51-60 More healthful dietary pattern, with some room for improvement.  >60 Healthy dietary pattern, although there may be some specific behaviors that could be improved.    Nutrition Goals Re-Evaluation:   Nutrition Goals Discharge (Final Nutrition Goals Re-Evaluation):   Psychosocial: Target Goals: Acknowledge presence or absence of significant depression and/or stress, maximize coping skills, provide positive support system. Participant is able to verbalize types and ability to use techniques and skills needed for reducing stress and depression.  Initial Review & Psychosocial Screening:  Initial Psych Review & Screening - 03/04/21 0924       Initial Review   Current issues with None Identified      Family Dynamics   Good Support System? Yes   BrCruzeas his daughter and family in the area for support     Barriers  Psychosocial barriers to participate in program There are no identifiable barriers or psychosocial needs.      Screening Interventions   Interventions Encouraged to exercise             Quality of Life Scores:  Quality of Life - 03/04/21 1140       Quality of Life   Select Quality of Life      Quality of Life Scores   Health/Function Pre 21.8 %    Socioeconomic Pre 17.64 %    Psych/Spiritual Pre 18.58 %    Family Pre 18.2 %    GLOBAL Pre 19.79 %            Scores of 19 and below usually indicate a poorer quality of life in these areas.  A difference of  2-3 points is a clinically meaningful difference.  A  difference of 2-3 points in the total score of the Quality of Life Index has been associated with significant improvement in overall quality of life, self-image, physical symptoms, and general health in studies assessing change in quality of life.  PHQ-9: Recent Review Flowsheet Data     Depression screen [facility] 2/9 03/04/2021   Decreased Interest 0   Down, Depressed, Hopeless 0   PHQ - 2 Score 0      Interpretation of Total Score  Total Score Depression Severity:  1-4 = Minimal depression, 5-9 = Mild depression, 10-14 = Moderate depression, 15-19 = Moderately severe depression, 20-27 = Severe depression   Psychosocial Evaluation and Intervention:   Psychosocial Re-Evaluation:  Psychosocial Re-Evaluation     [place] Name 03/11/21 1333 04/13/21 0840           Psychosocial Re-Evaluation   Current issues with None Identified None Identified      Comments Will review Okey's quality of life questionnaire in the near future No concerns voiced per quality of life review      Interventions Encouraged to attend Cardiac Rehabilitation for the exercise;Stress management education Encouraged to attend Cardiac Rehabilitation for the exercise;Stress management education      Continue Psychosocial Services  No Follow up required No Follow up required               Psychosocial Discharge (Final Psychosocial Re-Evaluation):  Psychosocial Re-Evaluation - 04/13/21 0840       Psychosocial Re-Evaluation   Current issues with None Identified    Comments No concerns voiced per quality of life review    Interventions Encouraged to attend Cardiac Rehabilitation for the exercise;Stress management education    Continue Psychosocial Services  No Follow up required             Vocational Rehabilitation: Provide vocational rehab assistance to qualifying candidates.   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 Participant will state understanding/return demonstration of topics presented.  Learning Barriers/Preferences:  Learning Barriers/Preferences - 03/04/21 1133       Learning Barriers/Preferences   Learning Barriers None    Learning Preferences Group Instruction;Individual Instruction;Skilled Demonstration             Education Topics: Hypertension,  Hypertension Reduction -Define heart disease and high blood pressure. Discus how high blood pressure affects the body and ways to reduce high blood pressure.   Exercise and Your Heart -Discuss why it is important to exercise, the FITT principles of exercise, normal and abnormal responses to exercise, and how to exercise safely.   Angina -Discuss definition of angina, causes of angina, treatment of angina, and how to decrease risk of having angina.   Cardiac Medications -Review what the following cardiac medications are used for, how they affect the body, and side effects that may occur when taking the medications.  Medications include Aspirin, Beta blockers, calcium channel blockers, ACE Inhibitors, angiotensin receptor blockers, diuretics, digoxin, and antihyperlipidemics.   Congestive Heart Failure -Discuss the definition of CHF, how to live with CHF, the signs and symptoms of CHF, and how keep track of weight and sodium intake.   Heart Disease and Intimacy -Discus the effect sexual activity has on the heart, how changes occur during intimacy as we age, and safety during sexual activity.   Smoking Cessation / COPD -Discuss different methods to quit smoking, the health benefits of quitting smoking, and the definition of COPD.   Nutrition I:  Fats -Discuss the types of cholesterol, what cholesterol does to the heart, and how cholesterol levels can be controlled.   Nutrition II: Labels -Discuss the different components of food labels and how to read food label   Heart Parts/Heart Disease and PAD -Discuss the anatomy of the heart, the pathway of blood circulation through the heart, and these are affected by heart disease.   Stress I: Signs and Symptoms -Discuss the causes of stress, how stress may lead to anxiety and depression, and ways to limit stress.   Stress II: Relaxation -Discuss different types of relaxation techniques to limit stress.   Warning Signs of Stroke / TIA -Discuss definition of a stroke, what the signs and symptoms are of a stroke, and how to identify when someone is having stroke.   Knowledge Questionnaire Score:  Knowledge Questionnaire Score - 03/04/21 1136       Knowledge Questionnaire Score   Pre Score 22/24             Core Components/Risk Factors/Patient Goals at Admission:  Personal Goals and Risk Factors at Admission - 03/04/21 1128       Core Components/Risk Factors/Patient Goals on Admission    Weight Management Yes;Weight Maintenance    Intervention Weight Management: Develop a combined nutrition and exercise program designed to reach desired caloric intake, while maintaining appropriate intake of nutrient and fiber, sodium and fats, and appropriate energy expenditure required for the weight goal.;Weight Management: Provide education and appropriate resources to help participant work on and attain dietary goals.    Expected Outcomes Short Term: Continue to assess and modify interventions until short term weight is achieved;Long Term: Adherence to nutrition and physical activity/exercise program aimed toward attainment of established weight goal;Weight Maintenance: Understanding of the daily nutrition guidelines, which includes 25-35% calories from fat, 7% or less cal from saturated  fats, less than 258m cholesterol, less than 1.5gm of sodium, & 5 or more servings of fruits and vegetables daily    Lipids Yes    Intervention Provide education and support for participant on nutrition & aerobic/resistive exercise along with prescribed medications to achieve LDL <755m HDL >4063m   Expected Outcomes Short Term: Participant states understanding of desired cholesterol values and is compliant with medications prescribed. Participant is following exercise prescription and nutrition  guidelines.;Long Term: Cholesterol controlled with medications as prescribed, with individualized exercise RX and with personalized nutrition plan. Value goals: LDL < 25m, HDL > 40 mg.    Personal Goal Other Yes    Personal Goal short term: start running Long term: run a 5 k, strength training    Intervention will continue to montor pt and progress workloads as tolerated without sign or symptom    Expected Outcomes Pt will achieve their goals in a safe and timely manner             Core Components/Risk Factors/Patient Goals Review:   Goals and Risk Factor Review     Row Name 03/08/21 1636 03/11/21 1336 04/13/21 0841         Core Components/Risk Factors/Patient Goals Review   Personal Goals Review Weight Management/Obesity;Lipids;Stress Weight Management/Obesity;Lipids;Stress Weight Management/Obesity;Lipids;Stress (P)      Review BJahazielstarted exercise on 03/08/21 and did well with exercise. VSS BDyamistarted exercise on 03/08/21 and is off to a good start to exercise. Vital signs have been stable BCarollstarted exercise on 03/08/21 and is off to a good start to exercise. Vital signs have been stable (P)      Expected Outcomes BMartinewill continue to participate in phase 2 cardiac rehab for exercise, nutritijn and lifestyle modifications BErcelwill continue to participate in phase 2 cardiac rehab for exercise, nutritijn and lifestyle modifications BNadeemwill continue to participate in phase 2 cardiac rehab  for exercise, nutritijn and lifestyle modifications (P)               Core Components/Risk Factors/Patient Goals at Discharge (Final Review):   Goals and Risk Factor Review - 04/13/21 0841       Core Components/Risk Factors/Patient Goals Review   Personal Goals Review Weight Management/Obesity;Lipids;Stress (P)     Review BAbdullastarted exercise on 03/08/21 and is off to a good start to exercise. Vital signs have been stable (P)     Expected Outcomes BDorselwill continue to participate in phase 2 cardiac rehab for exercise, nutritijn and lifestyle modifications (P)              ITP Comments:  ITP Comments     Row Name 03/04/21 0923 03/08/21 1527 03/11/21 1333 04/13/21 0837     ITP Comments Dr TFransico HimMD, Medical Director 30 Day ITP Review. Brain started exercise on 03/08/21 and did well with exercise 30 Day ITP Review. Brain started exercise on 03/08/21 and is off to a good start to exercise. 30 Day ITP Review. Brain has good attendance and participation             Comments: See ITP comments.MHarrell GaveRN BSN

## 2021-04-14 NOTE — Progress Notes (Signed)
Cardiac Individual Treatment Plan  Patient Details  Name: Berk Pilot MRN: [id number] Date of Birth: May 18, 1966 Referring Provider:   Flowsheet Row CARDIAC REHAB PHASE II ORIENTATION from 03/04/2021 in [place]  Referring Provider Dr. Geralynn Rile, MD       Initial Encounter Date:  [place] from 03/04/2021 in [place]  Date 03/04/21       Visit Diagnosis: 12/18/20 S/P mitral valve repair, minimally invasive  Patient's Home Medications on Admission:  Current Outpatient Medications:    amoxicillin (AMOXIL) 500 MG tablet, Take 4 tablets 30-60 minutes before dental procedures., Disp: 4 tablet, Rfl: 1   aspirin EC 81 MG EC tablet, Take 1 tablet (81 mg total) by mouth daily. Swallow whole., Disp: 30 tablet, Rfl: 11   folic acid (FOLVITE) 1 MG tablet, Take 2 mg by mouth daily., Disp: , Rfl:    methotrexate (RHEUMATREX) 2.5 MG tablet, Take 15 mg by mouth every Tuesday. Caution:Chemotherapy. 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   Complex valvuloplasty including artificial Gore-tex neochord placement x10 with 34 mm Medtronic Simuform ring annuloplasty via right mini thoracotomy approach    Tobacco Use: Social History   Tobacco Use  Smoking Status Former  Smokeless Tobacco Never    Labs: Recent Review Flowsheet Data     Labs for ITP Cardiac and Pulmonary Rehab Latest Ref Rng & Units 12/15/2020 12/15/2020 12/15/2020 12/15/2020 12/15/2020   Hemoglobin A1c 4.8 - 5.6 % - - - - -   PHART 7.350 - 7.450 7.409 - - 7.311(L) 7.287(L)   PCO2ART 32.0 - 48.0 mmHg 41.6 - -  48.3(H) 51.6(H)   HCO3 20.0 - 28.0 mmol/L 26.3 - - 24.4 24.7   TCO2 22 - 32 mmol/L _0 ACIDBASEDEF 0.0 - 2.0 mmol/L - - - 2.0 2.0   O2SAT % 100.0 - - 100.0 99.0       Capillary Blood Glucose: Lab Results  Component Value Date   GLUCAP 102 (H) 12/17/2020   GLUCAP 117 (H) 12/17/2020   GLUCAP 153 (H) 12/17/2020   GLUCAP 96 12/17/2020   GLUCAP 111 (H) 12/17/2020     Exercise Target Goals: Exercise Program Goal: Individual exercise prescription set using results from initial 6 min walk test and THRR while considering  patient's activity barriers and safety.   Exercise Prescription Goal: Starting with aerobic activity 30 plus minutes a day, 3 days per week for initial exercise prescription. Provide home exercise prescription and guidelines that participant acknowledges understanding prior to discharge.  Activity Barriers & Risk Stratification:  Activity Barriers & Cardiac Risk Stratification - 03/04/21 1017       Activity Barriers & Cardiac Risk Stratification   Activity Barriers None    Cardiac Risk Stratification Moderate   5.24 MET's            6 Minute Walk:  6 Minute Walk     Row Name 03/04/21 1014         6 Minute Walk   Phase Initial     Distance 2021 feet  Walk Time 6 minutes     # of Rest Breaks 0     MPH 3.83     METS 5.24     RPE 15     Perceived Dyspnea  0     VO2 Peak 18.36     Symptoms No     Resting HR 91 bpm     Resting BP 122/72     Resting Oxygen Saturation  97 %     Exercise Oxygen Saturation  during 6 min walk 99 %     Max Ex. HR 113 bpm     Max Ex. BP 142/68     2 Minute Post BP 122/78              Oxygen Initial Assessment:   Oxygen Re-Evaluation:   Oxygen Discharge (Final Oxygen Re-Evaluation):   Initial Exercise Prescription:  Initial Exercise Prescription - 03/04/21 1000       Date of Initial Exercise RX and Referring Provider   Date 03/04/21    Referring Provider Dr. Geralynn Rile, MD     Expected Discharge Date 04/30/21      Treadmill   MPH 2.5    Grade 1    Minutes 15    METs 3.26      Arm Ergometer   Level 2    RPM 60    Minutes 15    METs 3.2      Prescription Details   Frequency (times per week) 3    Duration Progress to 30 minutes of continuous aerobic without signs/symptoms of physical distress      Intensity   THRR 40-80% of Max Heartrate 66-132    Ratings of Perceived Exertion 11-13    Perceived Dyspnea 0-4      Progression   Progression Continue progressive overload as per policy without signs/symptoms or physical distress.      Resistance Training   Training Prescription Yes    Weight 5    Reps 10-15             Perform Capillary Blood Glucose checks as needed.  Exercise Prescription Changes:   Exercise Prescription Changes     Row Name 03/08/21 0841 03/22/21 0830 04/09/21 0833         Response to Exercise   Blood Pressure (Admit) 124/60 118/58 120/60     Blood Pressure (Exercise) 142/70 148/78 134/64     Blood Pressure (Exit) 118/62 110/70 120/74     Heart Rate (Admit) 91 bpm 75 bpm 93 bpm     Heart Rate (Exercise) 111 bpm 118 bpm 126 bpm     Heart Rate (Exit) 90 bpm 85 bpm 98 bpm     Rating of Perceived Exertion (Exercise) _0 Perceived Dyspnea (Exercise) 0 0 0     Symptoms 0 0 0     Comments Pt first day of exercise Reviewed MET's, goals and home exercise Reviewed MET's     Duration Progress to 30 minutes of  aerobic without signs/symptoms of physical distress Progress to 30 minutes of  aerobic without signs/symptoms of physical distress Progress to 30 minutes of  aerobic without signs/symptoms of physical distress     Intensity THRR unchanged THRR unchanged THRR unchanged           Progression   Progression Continue to progress workloads to maintain intensity without signs/symptoms of physical distress. Continue to progress workloads to maintain intensity without signs/symptoms of physical distress. Continue  to  progress workloads to maintain intensity without signs/symptoms of physical distress.     Average METs 3.4 4.49 5.33           Resistance Training   Training Prescription Yes No Yes     Weight 5 -- 6     Reps 10-15 -- 10-15     Time 10 Minutes -- 10 Minutes           Treadmill   MPH 2.5 3.2 3.5     Grade 1 1 0     Minutes _0 METs 3.26 4.77 3.68           Arm Ergometer   Level 2 3 --     RPM 60 60 --     Minutes 15 15 --     METs 3.5 4.2 --           Rower   Level -- -- 5     Watts -- -- 34     Minutes -- -- 15     METs -- -- 6.98           Home Exercise Plan   Plans to continue exercise at -- Home (comment) Home (comment)     Frequency -- Add 4 additional days to program exercise sessions. Add 4 additional days to program exercise sessions.     Initial Home Exercises Provided -- 03/22/21 03/22/21              Exercise Comments:   Exercise Comments     Row Name 03/08/21 0830 03/22/21 0830 04/09/21 0836       Exercise Comments pt first day of exercise in the [facility] program. Pt tolerated exercise well with an average MET level of 3.4. Pt understands his RPE scale, THRR and exercise Rx. Will continue to moitor pt and progress workloads as tolerated without sign or symptom. Reviwed MET's goals and home Ex Rx. Pt is tolerating exercise well with an average MET level of 4.49. Pt goals are to run again and strength training. Pt states he feels comfortable and on track. Will be adding in jogging with cardiologist approval soon. Pt is currently meeting ACSM guidlines by walking 2-3 days and biking 2-3 days, each session about 30 minutes, in addition to his 3 days in [place]. Pt is tolerating exercise well with an average MET level of 5.33. Will continue to monitor pt and progress workloads as tolerated without sign or symptom.              Exercise Goals and Review:   Exercise Goals     Row Name 03/04/21 1124             Exercise Goals    Increase Physical Activity Yes       Intervention Provide advice, education, support and counseling about physical activity/exercise needs.;Develop an individualized exercise prescription for aerobic and resistive training based on initial evaluation findings, risk stratification, comorbidities and participant's personal goals.       Expected Outcomes Short Term: Attend rehab on a regular basis to increase amount of physical activity.;Long Term: Add in home exercise to make exercise part of routine and to increase amount of physical activity.;Long Term: Exercising regularly at least 3-5 days a week.       Increase Strength and Stamina Yes       Intervention Provide advice, education, support and counseling about physical activity/exercise needs.;Develop an individualized exercise prescription for  aerobic and resistive training based on initial evaluation findings, risk stratification, comorbidities and participant's personal goals.       Expected Outcomes Short Term: Increase workloads from initial exercise prescription for resistance, speed, and METs.;Short Term: Perform resistance training exercises routinely during rehab and add in resistance training at home;Long Term: Improve cardiorespiratory fitness, muscular endurance and strength as measured by increased METs and functional capacity (6MWT)       Able to understand and use rate of perceived exertion (RPE) scale Yes       Intervention Provide education and explanation on how to use RPE scale       Expected Outcomes Long Term:  Able to use RPE to guide intensity level when exercising independently       Knowledge and understanding of Target Heart Rate Range (THRR) Yes       Intervention Provide education and explanation of THRR including how the numbers were predicted and where they are located for reference       Expected Outcomes Short Term: Able to state/look up THRR;Long Term: Able to use THRR to govern intensity when exercising  independently;Short Term: Able to use daily as guideline for intensity in rehab       Understanding of Exercise Prescription Yes       Intervention Provide education, explanation, and written materials on patient's individual exercise prescription       Expected Outcomes Short Term: Able to explain program exercise prescription;Long Term: Able to explain home exercise prescription to exercise independently                Exercise Goals Re-Evaluation :  Exercise Goals Re-Evaluation     [place] Name 03/08/21 0830 03/22/21 0830           Exercise Goal Re-Evaluation   Exercise Goals Review Increase Physical Activity;Increase Strength and Stamina;Able to understand and use rate of perceived exertion (RPE) scale;Knowledge and understanding of Target Heart Rate Range (THRR);Understanding of Exercise Prescription Increase Physical Activity;Increase Strength and Stamina;Able to understand and use rate of perceived exertion (RPE) scale;Knowledge and understanding of Target Heart Rate Range (THRR);Understanding of Exercise Prescription      Comments pt first day of exercise in the [facility] program. Pt tolerated exercise well with an average MET level of 3.4. Pt understands his RPE scale, THRR and exercise Rx Reviwed MET's goals and home Ex Rx. Pt is tolerating exercise well with an average MET level of 4.49. Pt goals are to run again and strength training. Pt states he feels comfortable and on track. Will be adding in jogging with cardiologist approval soon. Pt is currently meeting ACSM guidlines by walking 2-3 days and biking 2-3 days, each session about 30 minutes, in addition to his 3 days in [facility].      Expected Outcomes Will continue to monitor pt and progress worklads as tolerated without sign or symptom Will continue to monitor pt and progress worklads as tolerated without sign or symptom                Discharge Exercise Prescription (Final Exercise Prescription Changes):  Exercise Prescription  Changes - 04/09/21 0833       Response to Exercise   Blood Pressure (Admit) 120/60    Blood Pressure (Exercise) 134/64    Blood Pressure (Exit) 120/74    Heart Rate (Admit) 93 bpm    Heart Rate (Exercise) 126 bpm    Heart Rate (Exit) 98 bpm    Rating of Perceived Exertion (Exercise) 13  Perceived Dyspnea (Exercise) 0    Symptoms 0    Comments Reviewed MET's    Duration Progress to 30 minutes of  aerobic without signs/symptoms of physical distress    Intensity THRR unchanged      Progression   Progression Continue to progress workloads to maintain intensity without signs/symptoms of physical distress.    Average METs 5.33      Resistance Training   Training Prescription Yes    Weight 6    Reps 10-15    Time 10 Minutes      Treadmill   MPH 3.5    Grade 0    Minutes 15    METs 3.68      Rower   Level 5    Watts 34    Minutes 15    METs 6.98      Home Exercise Plan   Plans to continue exercise at Home (comment)    Frequency Add 4 additional days to program exercise sessions.    Initial Home Exercises Provided 03/22/21             Nutrition:  Target Goals: Understanding of nutrition guidelines, daily intake of sodium <1514m, cholesterol <2048m calories 30% from fat and 7% or less from saturated fats, daily to have 5 or more servings of fruits and vegetables.  Biometrics:  Pre Biometrics - 03/04/21 1125       Pre Biometrics   Height 5' 9.25" (1.759 m)    Weight 72.7 kg    Waist Circumference 35.25 inches    Hip Circumference 38 inches    Waist to Hip Ratio 0.93 %    BMI (Calculated) 23.5    Triceps Skinfold 14 mm    % Body Fat 23 %    Grip Strength 41 kg    Flexibility 10.75 in    Single Leg Stand 30 seconds              Nutrition Therapy Plan and Nutrition Goals:  Nutrition Therapy & Goals - 04/12/21 0929       Nutrition Therapy   RD appointment deferred Yes             Nutrition Assessments:  MEDIFICTS Score Key: ?70 Need to  make dietary changes  40-70 Heart Healthy Diet ? 40 Therapeutic Level Cholesterol Diet  Flowsheet Row CARDIAC REHAB [facility] [place] [facility] [place] on [place] 60      Picture Your Plate Scores: <4<38nhealthy dietary pattern with much room for improvement. 41-50 Dietary pattern unlikely to meet recommendations for good health and room for improvement. 51-60 More healthful dietary pattern, with some room for improvement.  >60 Healthy dietary pattern, although there may be some specific behaviors that could be improved.    Nutrition Goals Re-Evaluation:   Nutrition Goals Discharge (Final Nutrition Goals Re-Evaluation):   Psychosocial: Target Goals: Acknowledge presence or absence of significant depression and/or stress, maximize coping skills, provide positive support system. Participant is able to verbalize types and ability to use techniques and skills needed for reducing stress and depression.  Initial Review & Psychosocial Screening:  Initial Psych Review & Screening - 03/04/21 0924       Initial Review   Current issues with None Identified      Family Dynamics   Good Support System? Yes   [place] his daughter and family in the area for support     Barriers  Psychosocial barriers to participate in program There are no identifiable barriers or psychosocial needs.      Screening Interventions   Interventions Encouraged to exercise             Quality of Life Scores:  Quality of Life - 03/04/21 1140       Quality of Life   Select Quality of Life      Quality of Life Scores   Health/Function Pre 21.8 %    Socioeconomic Pre 17.64 %    Psych/Spiritual Pre 18.58 %    Family Pre 18.2 %    GLOBAL Pre 19.79 %            Scores of 19 and below usually indicate a poorer quality of life in these areas.  A difference of  2-3 points is a clinically meaningful difference.  A  difference of 2-3 points in the total score of the Quality of Life Index has been associated with significant improvement in overall quality of life, self-image, physical symptoms, and general health in studies assessing change in quality of life.  PHQ-9: Recent Review Flowsheet Data     Depression screen [facility] 2/9 03/04/2021   Decreased Interest 0   Down, Depressed, Hopeless 0   PHQ - 2 Score 0      Interpretation of Total Score  Total Score Depression Severity:  1-4 = Minimal depression, 5-9 = Mild depression, 10-14 = Moderate depression, 15-19 = Moderately severe depression, 20-27 = Severe depression   Psychosocial Evaluation and Intervention:   Psychosocial Re-Evaluation:  Psychosocial Re-Evaluation     [place] Name 03/11/21 1333 04/13/21 0840           Psychosocial Re-Evaluation   Current issues with None Identified None Identified      Comments Will review Okey's quality of life questionnaire in the near future No concerns voiced per quality of life review      Interventions Encouraged to attend Cardiac Rehabilitation for the exercise;Stress management education Encouraged to attend Cardiac Rehabilitation for the exercise;Stress management education      Continue Psychosocial Services  No Follow up required No Follow up required               Psychosocial Discharge (Final Psychosocial Re-Evaluation):  Psychosocial Re-Evaluation - 04/13/21 0840       Psychosocial Re-Evaluation   Current issues with None Identified    Comments No concerns voiced per quality of life review    Interventions Encouraged to attend Cardiac Rehabilitation for the exercise;Stress management education    Continue Psychosocial Services  No Follow up required             Vocational Rehabilitation: Provide vocational rehab assistance to qualifying candidates.   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 Participant will state understanding/return demonstration of topics presented.  Learning Barriers/Preferences:  Learning Barriers/Preferences - 03/04/21 1133       Learning Barriers/Preferences   Learning Barriers None    Learning Preferences Group Instruction;Individual Instruction;Skilled Demonstration             Education Topics: Hypertension,  Hypertension Reduction -Define heart disease and high blood pressure. Discus how high blood pressure affects the body and ways to reduce high blood pressure.   Exercise and Your Heart -Discuss why it is important to exercise, the FITT principles of exercise, normal and abnormal responses to exercise, and how to exercise safely.   Angina -Discuss definition of angina, causes of angina, treatment of angina, and how to decrease risk of having angina.   Cardiac Medications -Review what the following cardiac medications are used for, how they affect the body, and side effects that may occur when taking the medications.  Medications include Aspirin, Beta blockers, calcium channel blockers, ACE Inhibitors, angiotensin receptor blockers, diuretics, digoxin, and antihyperlipidemics.   Congestive Heart Failure -Discuss the definition of CHF, how to live with CHF, the signs and symptoms of CHF, and how keep track of weight and sodium intake.   Heart Disease and Intimacy -Discus the effect sexual activity has on the heart, how changes occur during intimacy as we age, and safety during sexual activity.   Smoking Cessation / COPD -Discuss different methods to quit smoking, the health benefits of quitting smoking, and the definition of COPD.   Nutrition I:  Fats -Discuss the types of cholesterol, what cholesterol does to the heart, and how cholesterol levels can be controlled.   Nutrition II: Labels -Discuss the different components of food labels and how to read food label   Heart Parts/Heart Disease and PAD -Discuss the anatomy of the heart, the pathway of blood circulation through the heart, and these are affected by heart disease.   Stress I: Signs and Symptoms -Discuss the causes of stress, how stress may lead to anxiety and depression, and ways to limit stress.   Stress II: Relaxation -Discuss different types of relaxation techniques to limit stress.   Warning Signs of Stroke / TIA -Discuss definition of a stroke, what the signs and symptoms are of a stroke, and how to identify when someone is having stroke.   Knowledge Questionnaire Score:  Knowledge Questionnaire Score - 03/04/21 1136       Knowledge Questionnaire Score   Pre Score 22/24             Core Components/Risk Factors/Patient Goals at Admission:  Personal Goals and Risk Factors at Admission - 03/04/21 1128       Core Components/Risk Factors/Patient Goals on Admission    Weight Management Yes;Weight Maintenance    Intervention Weight Management: Develop a combined nutrition and exercise program designed to reach desired caloric intake, while maintaining appropriate intake of nutrient and fiber, sodium and fats, and appropriate energy expenditure required for the weight goal.;Weight Management: Provide education and appropriate resources to help participant work on and attain dietary goals.    Expected Outcomes Short Term: Continue to assess and modify interventions until short term weight is achieved;Long Term: Adherence to nutrition and physical activity/exercise program aimed toward attainment of established weight goal;Weight Maintenance: Understanding of the daily nutrition guidelines, which includes 25-35% calories from fat, 7% or less cal from saturated  fats, less than 258m cholesterol, less than 1.5gm of sodium, & 5 or more servings of fruits and vegetables daily    Lipids Yes    Intervention Provide education and support for participant on nutrition & aerobic/resistive exercise along with prescribed medications to achieve LDL <755m HDL >4063m   Expected Outcomes Short Term: Participant states understanding of desired cholesterol values and is compliant with medications prescribed. Participant is following exercise prescription and nutrition  guidelines.;Long Term: Cholesterol controlled with medications as prescribed, with individualized exercise RX and with personalized nutrition plan. Value goals: LDL < 70mg, HDL > 40 mg.    Personal Goal Other Yes    Personal Goal short term: start running Long term: run a 5 k, strength training    Intervention will continue to montor pt and progress workloads as tolerated without sign or symptom    Expected Outcomes Pt will achieve their goals in a safe and timely manner             Core Components/Risk Factors/Patient Goals Review:   Goals and Risk Factor Review     Row Name 03/08/21 1636 03/11/21 1336 04/13/21 0841         Core Components/Risk Factors/Patient Goals Review   Personal Goals Review Weight Management/Obesity;Lipids;Stress Weight Management/Obesity;Lipids;Stress Weight Management/Obesity;Lipids;Stress (P)      Review Damani started exercise on 03/08/21 and did well with exercise. VSS Eldra started exercise on 03/08/21 and is off to a good start to exercise. Vital signs have been stable Osamah started exercise on 03/08/21 and is off to a good start to exercise. Vital signs have been stable (P)      Expected Outcomes Korry will continue to participate in phase 2 cardiac rehab for exercise, nutritijn and lifestyle modifications Kaeden will continue to participate in phase 2 cardiac rehab for exercise, nutritijn and lifestyle modifications Jachai will continue to participate in phase 2 cardiac rehab  for exercise, nutritijn and lifestyle modifications (P)               Core Components/Risk Factors/Patient Goals at Discharge (Final Review):   Goals and Risk Factor Review - 04/13/21 0841       Core Components/Risk Factors/Patient Goals Review   Personal Goals Review Weight Management/Obesity;Lipids;Stress (P)     Review Abdulah started exercise on 03/08/21 and is off to a good start to exercise. Vital signs have been stable (P)     Expected Outcomes Raymund will continue to participate in phase 2 cardiac rehab for exercise, nutritijn and lifestyle modifications (P)              ITP Comments:  ITP Comments     Row Name 03/04/21 0923 03/08/21 1527 03/11/21 1333 04/13/21 0837     ITP Comments Dr Traci Turner MD, Medical Director 30 Day ITP Review. Brain started exercise on 03/08/21 and did well with exercise 30 Day ITP Review. Brain started exercise on 03/08/21 and is off to a good start to exercise. 30 Day ITP Review. Brain has good attendance and participation             Comments: See ITP comments.Blimie Vaness Walden Aydn Ferrara RN BSN  

## 2021-04-15 ENCOUNTER — Encounter (HOSPITAL_BASED_OUTPATIENT_CLINIC_OR_DEPARTMENT_OTHER): Payer: Self-pay

## 2021-04-16 ENCOUNTER — Other Ambulatory Visit: Payer: Self-pay

## 2021-04-16 ENCOUNTER — Encounter (HOSPITAL_COMMUNITY)
Admission: RE | Admit: 2021-04-16 | Discharge: 2021-04-16 | Disposition: A | Discharge status: Home / Self Care | Payer: BC Managed Care – PPO | Source: Ambulatory Visit | Attending: Cardiovascular Disease | Admitting: Cardiovascular Disease

## 2021-04-16 DIAGNOSIS — Z9889 Other specified postprocedural states: Secondary | ICD-10-CM

## 2021-04-19 ENCOUNTER — Other Ambulatory Visit: Payer: Self-pay

## 2021-04-19 ENCOUNTER — Encounter (HOSPITAL_COMMUNITY)
Admission: RE | Admit: 2021-04-19 | Discharge: 2021-04-19 | Disposition: A | Discharge status: Home / Self Care | Payer: BC Managed Care – PPO | Source: Ambulatory Visit | Attending: Cardiovascular Disease | Admitting: Cardiovascular Disease

## 2021-04-19 DIAGNOSIS — Z9889 Other specified postprocedural states: Secondary | ICD-10-CM

## 2021-04-21 ENCOUNTER — Encounter (HOSPITAL_BASED_OUTPATIENT_CLINIC_OR_DEPARTMENT_OTHER): Payer: Self-pay | Admitting: General Practice

## 2021-04-21 ENCOUNTER — Encounter (HOSPITAL_COMMUNITY)
Admission: RE | Admit: 2021-04-21 | Discharge: 2021-04-21 | Disposition: A | Discharge status: Home / Self Care | Payer: BC Managed Care – PPO | Source: Ambulatory Visit | Attending: Cardiovascular Disease | Admitting: Cardiovascular Disease

## 2021-04-21 ENCOUNTER — Ambulatory Visit (INDEPENDENT_AMBULATORY_CARE_PROVIDER_SITE_OTHER): Payer: BC Managed Care – PPO | Admitting: General Practice

## 2021-04-21 ENCOUNTER — Ambulatory Visit (INDEPENDENT_AMBULATORY_CARE_PROVIDER_SITE_OTHER): Payer: BC Managed Care – PPO

## 2021-04-21 ENCOUNTER — Other Ambulatory Visit: Payer: Self-pay

## 2021-04-21 VITALS — BP 124/72 | HR 84 | Ht 69.25 in | Wt 163.8 lb

## 2021-04-21 DIAGNOSIS — R002 Palpitations: Secondary | ICD-10-CM

## 2021-04-21 DIAGNOSIS — Z9889 Other specified postprocedural states: Secondary | ICD-10-CM | POA: Diagnosis not present

## 2021-04-21 DIAGNOSIS — R42 Dizziness and giddiness: Secondary | ICD-10-CM

## 2021-04-21 DIAGNOSIS — I340 Nonrheumatic mitral (valve) insufficiency: Secondary | ICD-10-CM

## 2021-04-21 NOTE — Patient Instructions (Signed)
Medication Instructions:  Your physician recommends that you continue on your current medications as directed. Please refer to the Current Medication list given to you today.  *If you need a refill on your cardiac medications before your next appointment, please call your pharmacy*    Testing/Procedures: Your physician has recommended that you wear a 7 day Zio XT monitor.   This monitor is a medical device that records the heart's electrical activity. Doctors most often use these monitors to diagnose arrhythmias. Arrhythmias are problems with the speed or rhythm of the heartbeat. The monitor is a small device applied to your chest. You can wear one while you do your normal daily activities. While wearing this monitor if you have any symptoms to push the button and record what you felt. Once you have worn this monitor for the period of time provider prescribed (Usually 14 days), you will return the monitor device in the postage paid box. Once it is returned they will download the data collected and provide [place] with a report which the provider will then review and we will call you with those results. Important tips:  Avoid showering during the first 24 hours of wearing the monitor. Avoid excessive sweating to help maximize wear time. Do not submerge the device, no hot tubs, and no swimming pools. Keep any lotions or oils away from the patch. After 24 hours you may shower with the patch on. Take brief showers with your back facing the shower head.  Do not remove patch once it has been placed because that will interrupt data and decrease adhesive wear time. Push the button when you have any symptoms and write down what you were feeling. Once you have completed wearing your monitor, remove and place into box which has postage paid and place in your outgoing mailbox.  If for some reason you have misplaced your box then call our office and we can provide another box and/or mail it off for  you.   Follow-Up: At [facility], you and your health needs are our priority.  As part of our continuing mission to provide you with exceptional heart care, we have created designated Provider Care Teams.  These Care Teams include your primary Cardiologist (physician) and Advanced Practice Providers (APPs -  Physician Assistants and Nurse Practitioners) who all work together to provide you with the care you need, when you need it.  We recommend signing up for the patient portal called "MyChart".  Sign up information is provided on this After Visit Summary.  MyChart is used to connect with patients for Virtual Visits (Telemedicine).  Patients are able to view lab/test results, encounter notes, upcoming appointments, etc.  Non-urgent messages can be sent to your provider as well.   To learn more about what you can do with MyChart, go to [email or web address].    Your next appointment:   6 month(s)  The format for your next appointment:   In Person  Provider:   You may see Dr. Flora Lipps or one of the following Advanced Practice Providers on your designated Care Team:   Edd Fabian, NP   Other Instructions Verdon Cummins, NP has recommended continuing your diet and physical activity. He has also recommended increasing your hydration, ok to drink sports drinks like Gatorade and Propel.

## 2021-04-22 DIAGNOSIS — R002 Palpitations: Secondary | ICD-10-CM | POA: Diagnosis not present

## 2021-04-23 ENCOUNTER — Other Ambulatory Visit: Payer: Self-pay

## 2021-04-23 ENCOUNTER — Encounter (HOSPITAL_COMMUNITY)
Admission: RE | Admit: 2021-04-23 | Discharge: 2021-04-23 | Disposition: A | Discharge status: Home / Self Care | Payer: BC Managed Care – PPO | Source: Ambulatory Visit | Attending: Cardiovascular Disease | Admitting: Cardiovascular Disease

## 2021-04-23 DIAGNOSIS — Z9889 Other specified postprocedural states: Secondary | ICD-10-CM | POA: Diagnosis not present

## 2021-04-26 ENCOUNTER — Other Ambulatory Visit: Payer: Self-pay

## 2021-04-26 ENCOUNTER — Encounter (HOSPITAL_COMMUNITY)
Admission: RE | Admit: 2021-04-26 | Discharge: 2021-04-26 | Disposition: A | Discharge status: Home / Self Care | Payer: BC Managed Care – PPO | Source: Ambulatory Visit | Attending: Cardiovascular Disease | Admitting: Cardiovascular Disease

## 2021-04-26 DIAGNOSIS — Z9889 Other specified postprocedural states: Secondary | ICD-10-CM

## 2021-04-28 ENCOUNTER — Encounter (HOSPITAL_COMMUNITY)
Admission: RE | Admit: 2021-04-28 | Discharge: 2021-04-28 | Disposition: A | Discharge status: Home / Self Care | Payer: BC Managed Care – PPO | Source: Ambulatory Visit | Attending: Cardiovascular Disease | Admitting: Cardiovascular Disease

## 2021-04-28 ENCOUNTER — Other Ambulatory Visit: Payer: Self-pay

## 2021-04-28 DIAGNOSIS — Z9889 Other specified postprocedural states: Secondary | ICD-10-CM | POA: Diagnosis not present

## 2021-04-30 ENCOUNTER — Other Ambulatory Visit: Payer: Self-pay

## 2021-04-30 ENCOUNTER — Encounter (HOSPITAL_COMMUNITY)
Admission: RE | Admit: 2021-04-30 | Discharge: 2021-04-30 | Disposition: A | Discharge status: Home / Self Care | Payer: BC Managed Care – PPO | Source: Ambulatory Visit | Attending: Cardiovascular Disease | Admitting: Cardiovascular Disease

## 2021-04-30 DIAGNOSIS — Z9889 Other specified postprocedural states: Secondary | ICD-10-CM

## 2021-05-03 ENCOUNTER — Encounter (HOSPITAL_COMMUNITY)
Admission: RE | Admit: 2021-05-03 | Discharge: 2021-05-03 | Disposition: A | Discharge status: Home / Self Care | Payer: BC Managed Care – PPO | Source: Ambulatory Visit | Attending: Cardiovascular Disease | Admitting: Cardiovascular Disease

## 2021-05-03 ENCOUNTER — Other Ambulatory Visit: Payer: Self-pay

## 2021-05-03 VITALS — Ht 69.25 in | Wt 160.5 lb

## 2021-05-03 DIAGNOSIS — Z9889 Other specified postprocedural states: Secondary | ICD-10-CM | POA: Diagnosis not present

## 2021-05-04 DIAGNOSIS — R002 Palpitations: Secondary | ICD-10-CM | POA: Diagnosis not present

## 2021-05-05 ENCOUNTER — Other Ambulatory Visit: Payer: Self-pay

## 2021-05-05 ENCOUNTER — Encounter (HOSPITAL_COMMUNITY)
Admission: RE | Admit: 2021-05-05 | Discharge: 2021-05-05 | Disposition: A | Discharge status: Home / Self Care | Payer: BC Managed Care – PPO | Source: Ambulatory Visit | Attending: Cardiovascular Disease | Admitting: Cardiovascular Disease

## 2021-05-05 DIAGNOSIS — Z9889 Other specified postprocedural states: Secondary | ICD-10-CM | POA: Diagnosis not present

## 2021-05-07 ENCOUNTER — Encounter (HOSPITAL_COMMUNITY)
Admission: RE | Admit: 2021-05-07 | Discharge: 2021-05-07 | Disposition: A | Discharge status: Home / Self Care | Payer: BC Managed Care – PPO | Source: Ambulatory Visit | Attending: Cardiovascular Disease | Admitting: Cardiovascular Disease

## 2021-05-07 ENCOUNTER — Other Ambulatory Visit: Payer: Self-pay

## 2021-05-07 DIAGNOSIS — Z9889 Other specified postprocedural states: Secondary | ICD-10-CM | POA: Diagnosis not present
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
## 2021-06-17 DIAGNOSIS — R351 Nocturia: Secondary | ICD-10-CM | POA: Diagnosis not present

## 2021-06-17 DIAGNOSIS — Z125 Encounter for screening for malignant neoplasm of prostate: Secondary | ICD-10-CM | POA: Diagnosis not present

## 2021-06-17 DIAGNOSIS — Z0000 Encounter for general adult medical examination without abnormal findings: Secondary | ICD-10-CM | POA: Diagnosis not present

## 2021-06-17 DIAGNOSIS — E781 Pure hyperglyceridemia: Secondary | ICD-10-CM | POA: Diagnosis not present

## 2021-06-17 DIAGNOSIS — I83891 Varicose veins of right lower extremities with other complications: Secondary | ICD-10-CM | POA: Insufficient documentation

## 2021-06-17 DIAGNOSIS — D84821 Immunodeficiency due to drugs: Secondary | ICD-10-CM | POA: Insufficient documentation

## 2021-06-18 DIAGNOSIS — Z79899 Other long term (current) drug therapy: Secondary | ICD-10-CM | POA: Diagnosis not present

## 2021-06-18 DIAGNOSIS — E781 Pure hyperglyceridemia: Secondary | ICD-10-CM | POA: Diagnosis not present

## 2021-06-18 DIAGNOSIS — Z125 Encounter for screening for malignant neoplasm of prostate: Secondary | ICD-10-CM | POA: Diagnosis not present

## 2021-06-18 DIAGNOSIS — D84821 Immunodeficiency due to drugs: Secondary | ICD-10-CM | POA: Diagnosis not present

## 2021-06-29 ENCOUNTER — Encounter: Payer: Self-pay | Admitting: Cardiovascular Disease

## 2021-06-29 MED ORDER — AMOXICILLIN 500 MG PO TABS: ORAL_TABLET | ORAL | 3 refills | Status: DC

## 2021-06-29 NOTE — Telephone Encounter (Signed)
Sent new prescription to pharmacy   Amoxicillin  2 gm   30- 60 min prior to dental procedure with 3 refills

## 2021-07-02 DIAGNOSIS — R351 Nocturia: Secondary | ICD-10-CM | POA: Diagnosis not present

## 2021-07-14 DIAGNOSIS — L570 Actinic keratosis: Secondary | ICD-10-CM | POA: Diagnosis not present

## 2021-07-14 DIAGNOSIS — Z1283 Encounter for screening for malignant neoplasm of skin: Secondary | ICD-10-CM | POA: Diagnosis not present

## 2021-07-14 DIAGNOSIS — D225 Melanocytic nevi of trunk: Secondary | ICD-10-CM | POA: Diagnosis not present

## 2021-07-14 DIAGNOSIS — X32XXXA Exposure to sunlight, initial encounter: Secondary | ICD-10-CM | POA: Diagnosis not present

## 2021-07-14 DIAGNOSIS — C4442 Squamous cell carcinoma of skin of scalp and neck: Secondary | ICD-10-CM | POA: Diagnosis not present

## 2021-07-22 DIAGNOSIS — M0579 Rheumatoid arthritis with rheumatoid factor of multiple sites without organ or systems involvement: Secondary | ICD-10-CM | POA: Diagnosis not present

## 2021-07-22 DIAGNOSIS — Z79899 Other long term (current) drug therapy: Secondary | ICD-10-CM | POA: Diagnosis not present

## 2021-10-15 DIAGNOSIS — M0579 Rheumatoid arthritis with rheumatoid factor of multiple sites without organ or systems involvement: Secondary | ICD-10-CM | POA: Diagnosis not present

## 2021-10-15 DIAGNOSIS — Z79899 Other long term (current) drug therapy: Secondary | ICD-10-CM | POA: Diagnosis not present

## 2021-11-26 DIAGNOSIS — I83891 Varicose veins of right lower extremities with other complications: Secondary | ICD-10-CM | POA: Diagnosis not present

## 2021-12-17 DIAGNOSIS — I8311 Varicose veins of right lower extremity with inflammation: Secondary | ICD-10-CM | POA: Diagnosis not present

## 2021-12-29 DIAGNOSIS — I8311 Varicose veins of right lower extremity with inflammation: Secondary | ICD-10-CM | POA: Diagnosis not present

## 2022-01-20 DIAGNOSIS — Z79899 Other long term (current) drug therapy: Secondary | ICD-10-CM | POA: Diagnosis not present

## 2022-01-20 DIAGNOSIS — M0579 Rheumatoid arthritis with rheumatoid factor of multiple sites without organ or systems involvement: Secondary | ICD-10-CM | POA: Diagnosis not present

## 2022-02-09 IMAGING — DX DG CHEST 1V PORT
1 series · 1 of 1 positions shown · non-contrast
Comparison: 12/16/2020.

CLINICAL DATA: Chest tube.  Open-heart surgery.

EXAM:
PORTABLE CHEST 1 VIEW

[chest ap]
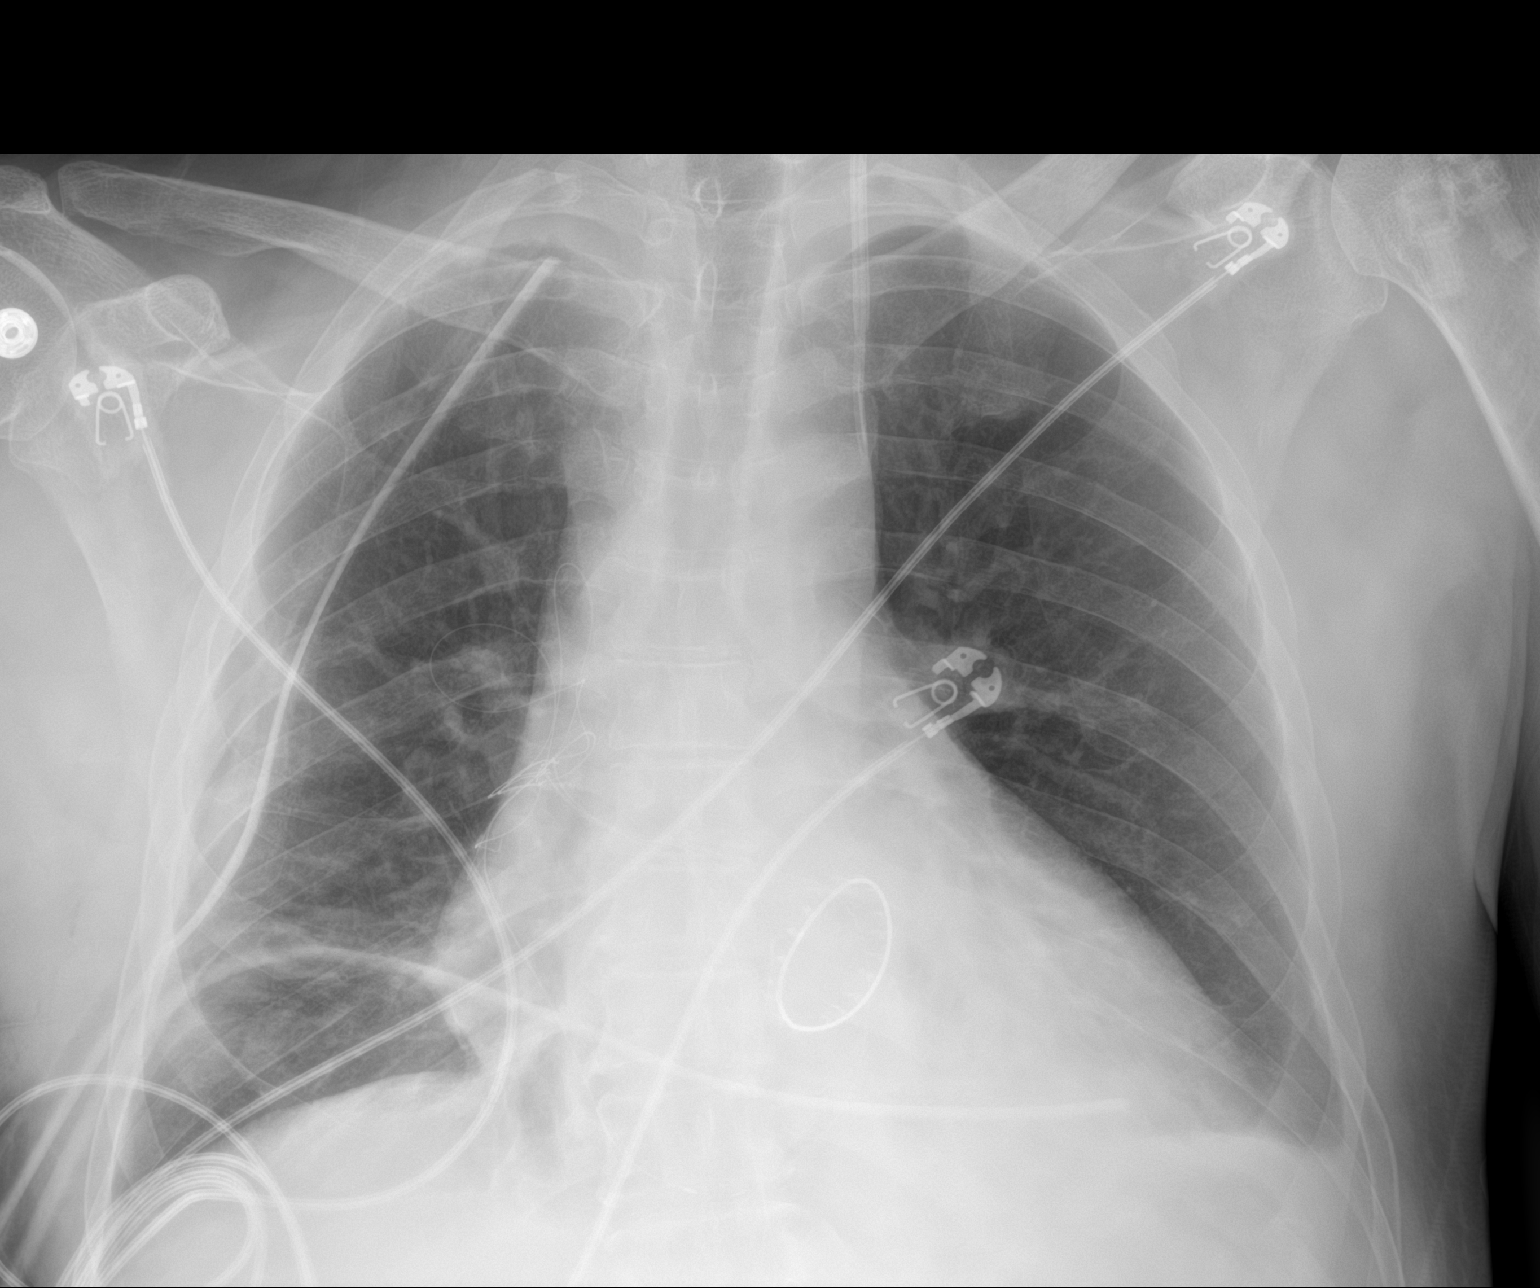

[1 of 1 positions shown; findings below may reference images not displayed]

FINDINGS: Left IJ line, right chest tube, mediastinal drainage catheter stable
position. Prior cardiac valve replacement. Cardiomegaly. No focal
infiltrate. Tiny left pleural effusion. No pneumothorax on today's
exam.
IMPRESSION: 1. Lines and tubes including right chest tube in stable position. No
pneumothorax noted on today's exam.

2. Prior cardiac valve replacement. Cardiomegaly. No pulmonary
venous congestion.

## 2022-02-11 IMAGING — CR DG CHEST 2V
2 series · 2 of 2 positions shown · non-contrast
Comparison: December 17, 2020

CLINICAL DATA: Chest tube removal

EXAM:
CHEST - 2 VIEW

[chest pa]
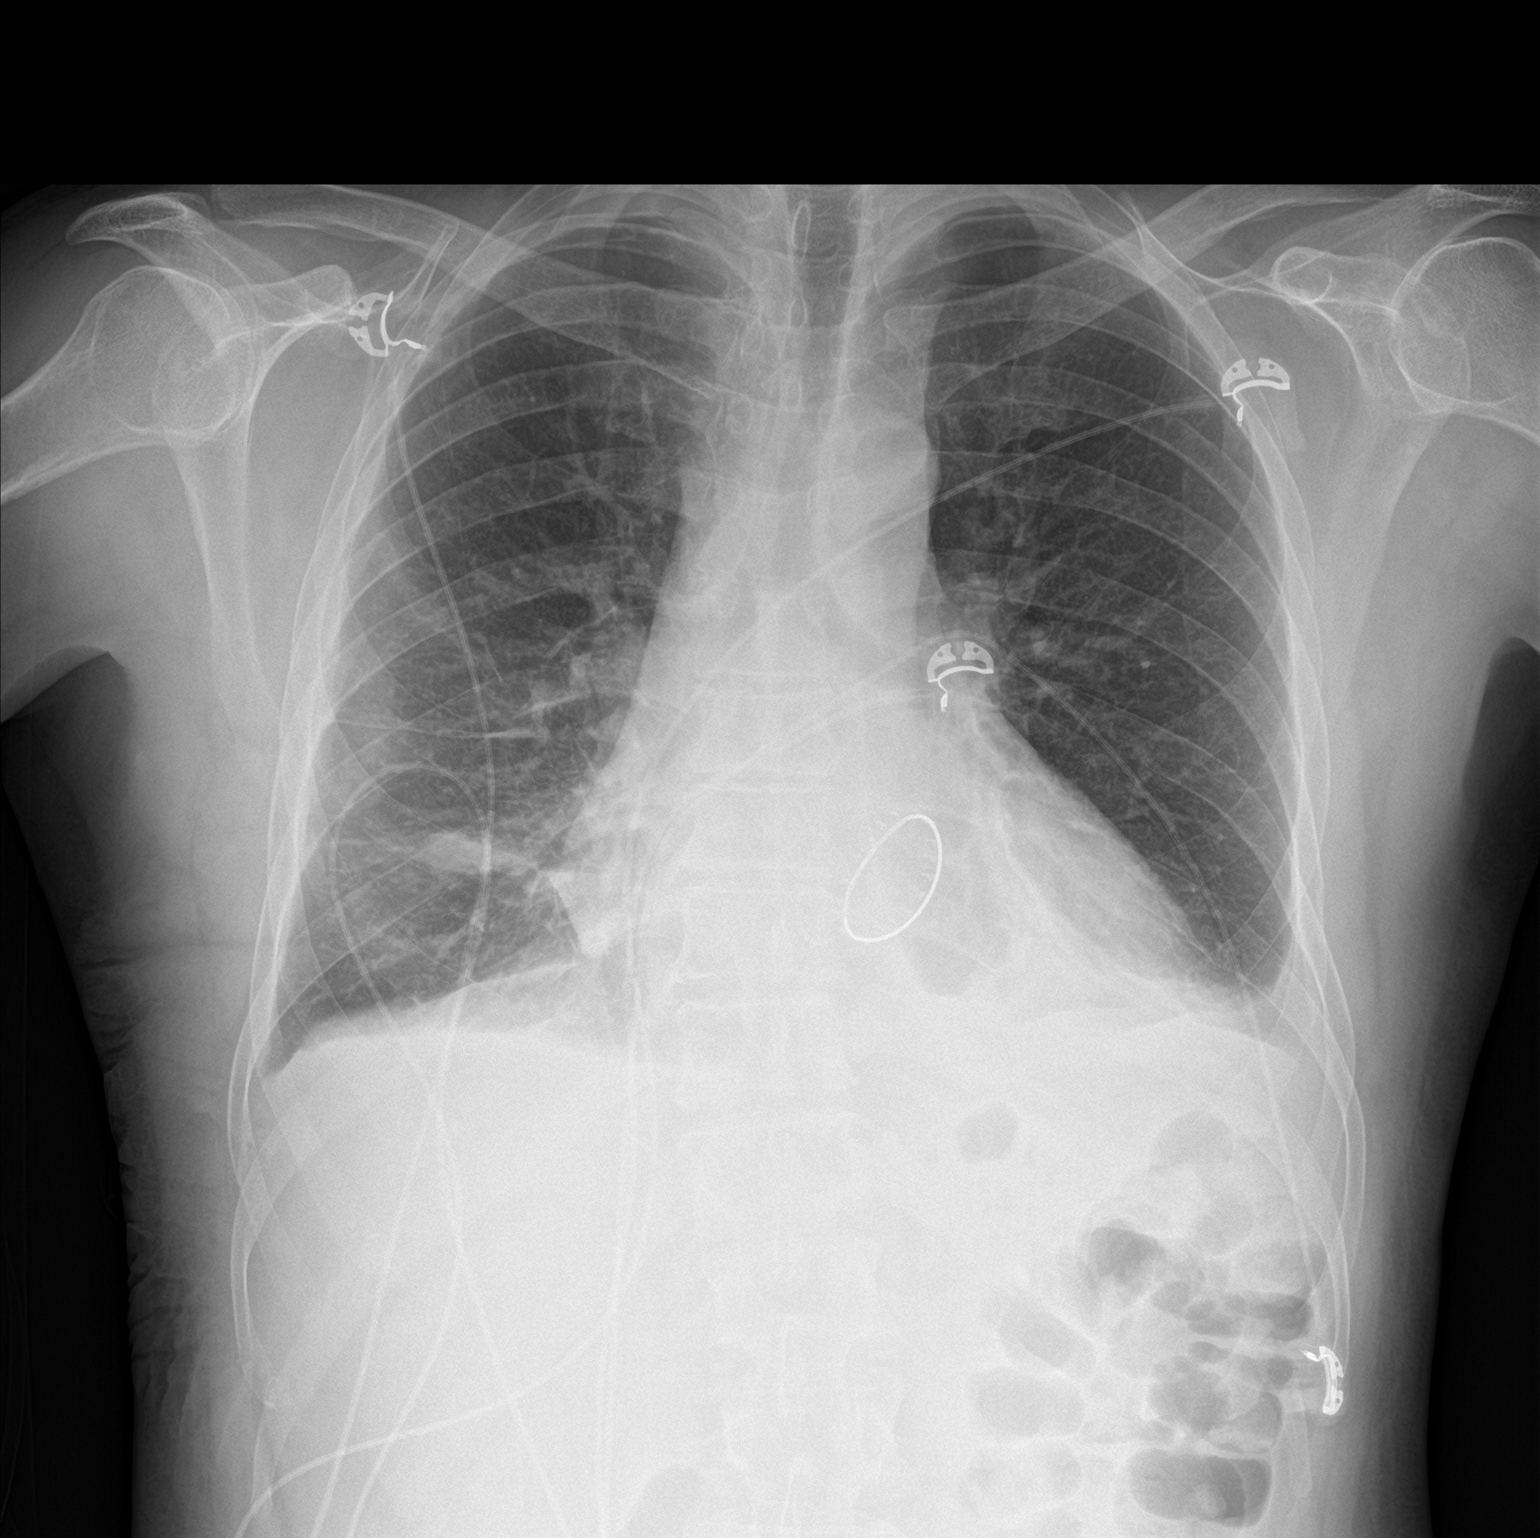

[chest lat]
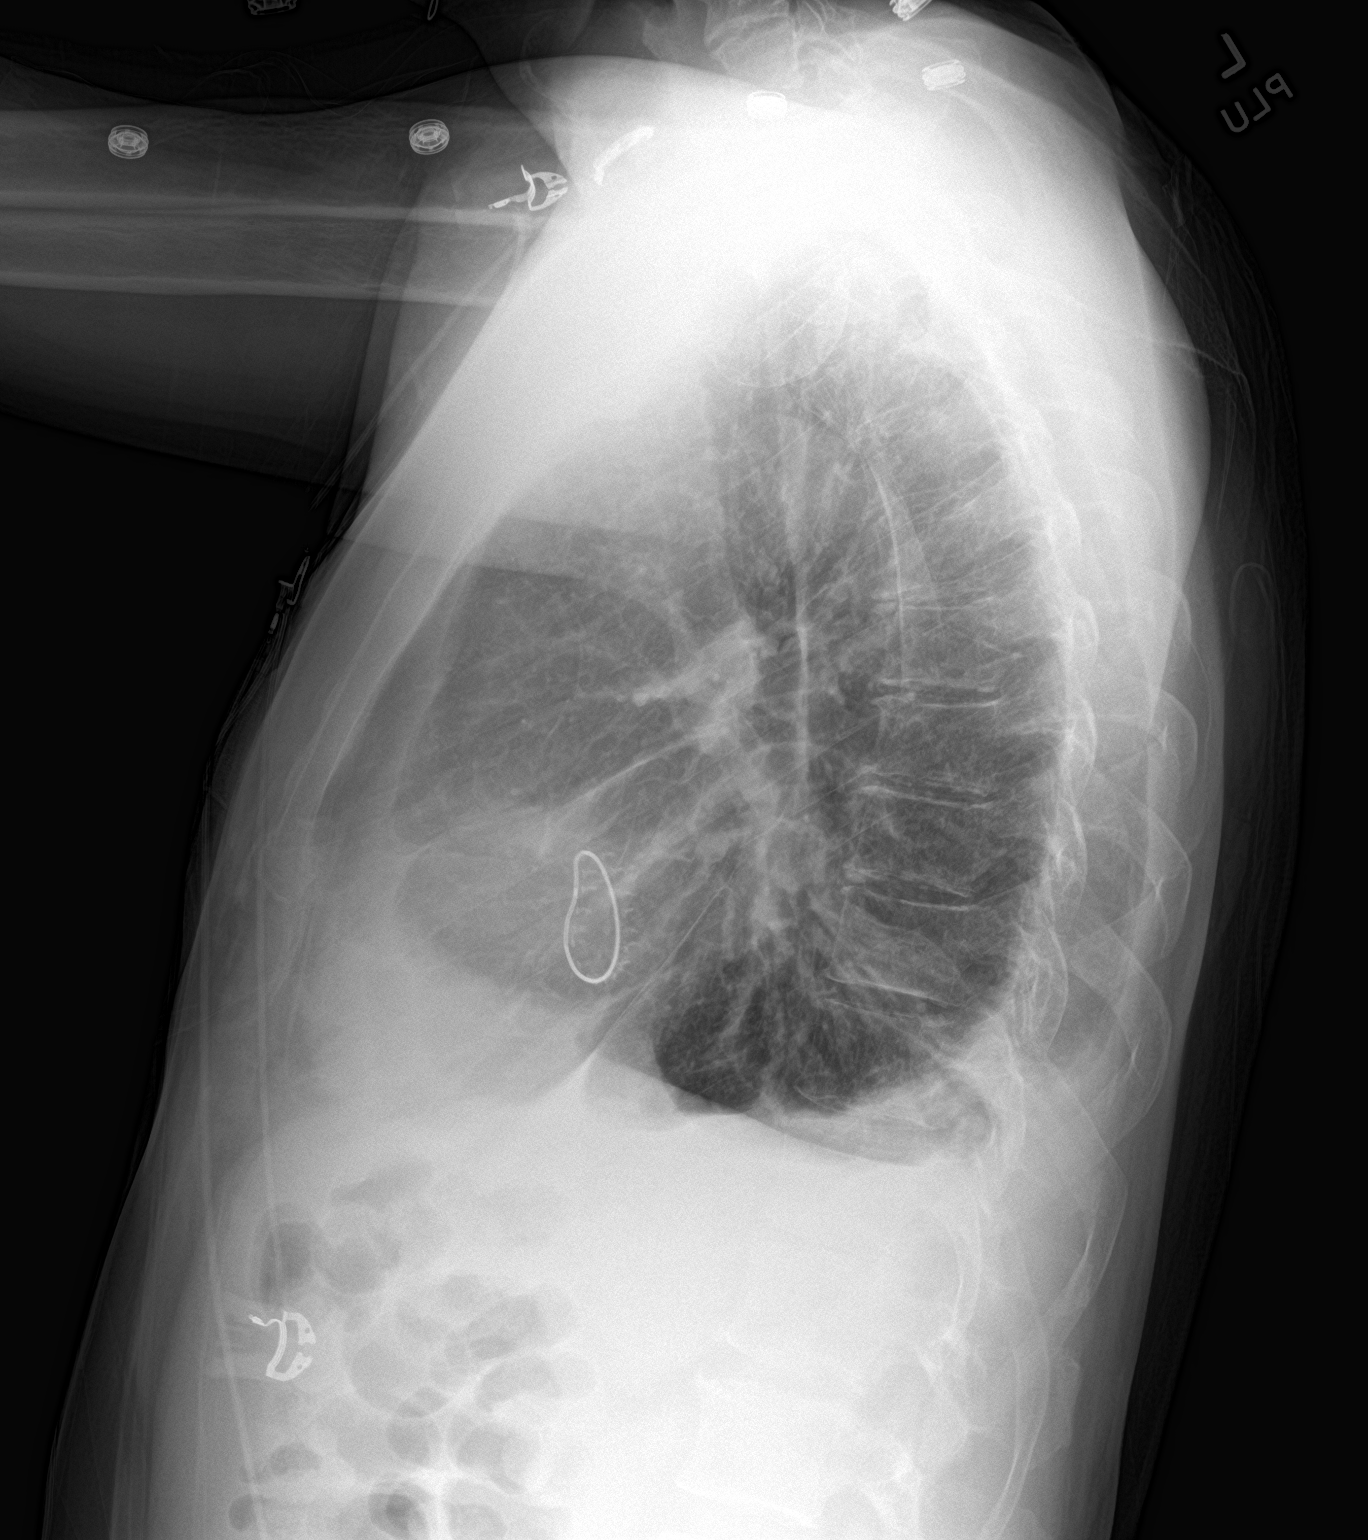

[2 of 2 positions shown; findings below may reference images not displayed]

FINDINGS: The right chest tube is been removed. A tiny right apical
pneumothorax is not excluded. Platelike opacity in the right
infrahilar region is probably atelectasis. Small bilateral pleural
effusions are identified. The lungs are otherwise clear. The
cardiomediastinal silhouette is stable. The left central line is
also been removed.
IMPRESSION: 1. Removal of support apparatus in the interval. A tiny right apical
pneumothorax is not excluded. Recommend attention on follow-up.
2. Small bilateral pleural effusions with underlying atelectasis.
3. No other changes.

These results will be called to the ordering clinician or
representative by the Radiologist Assistant, and communication
documented in the PACS or [REDACTED].

## 2022-03-13 IMAGING — DX DG CHEST 2V
2 series · 2 of 2 positions shown · non-contrast
Comparison: 01/04/2021

CLINICAL DATA: Status post mitral valve repair

EXAM:
CHEST - 2 VIEW

[dg chest 2 view (1 of 2)]
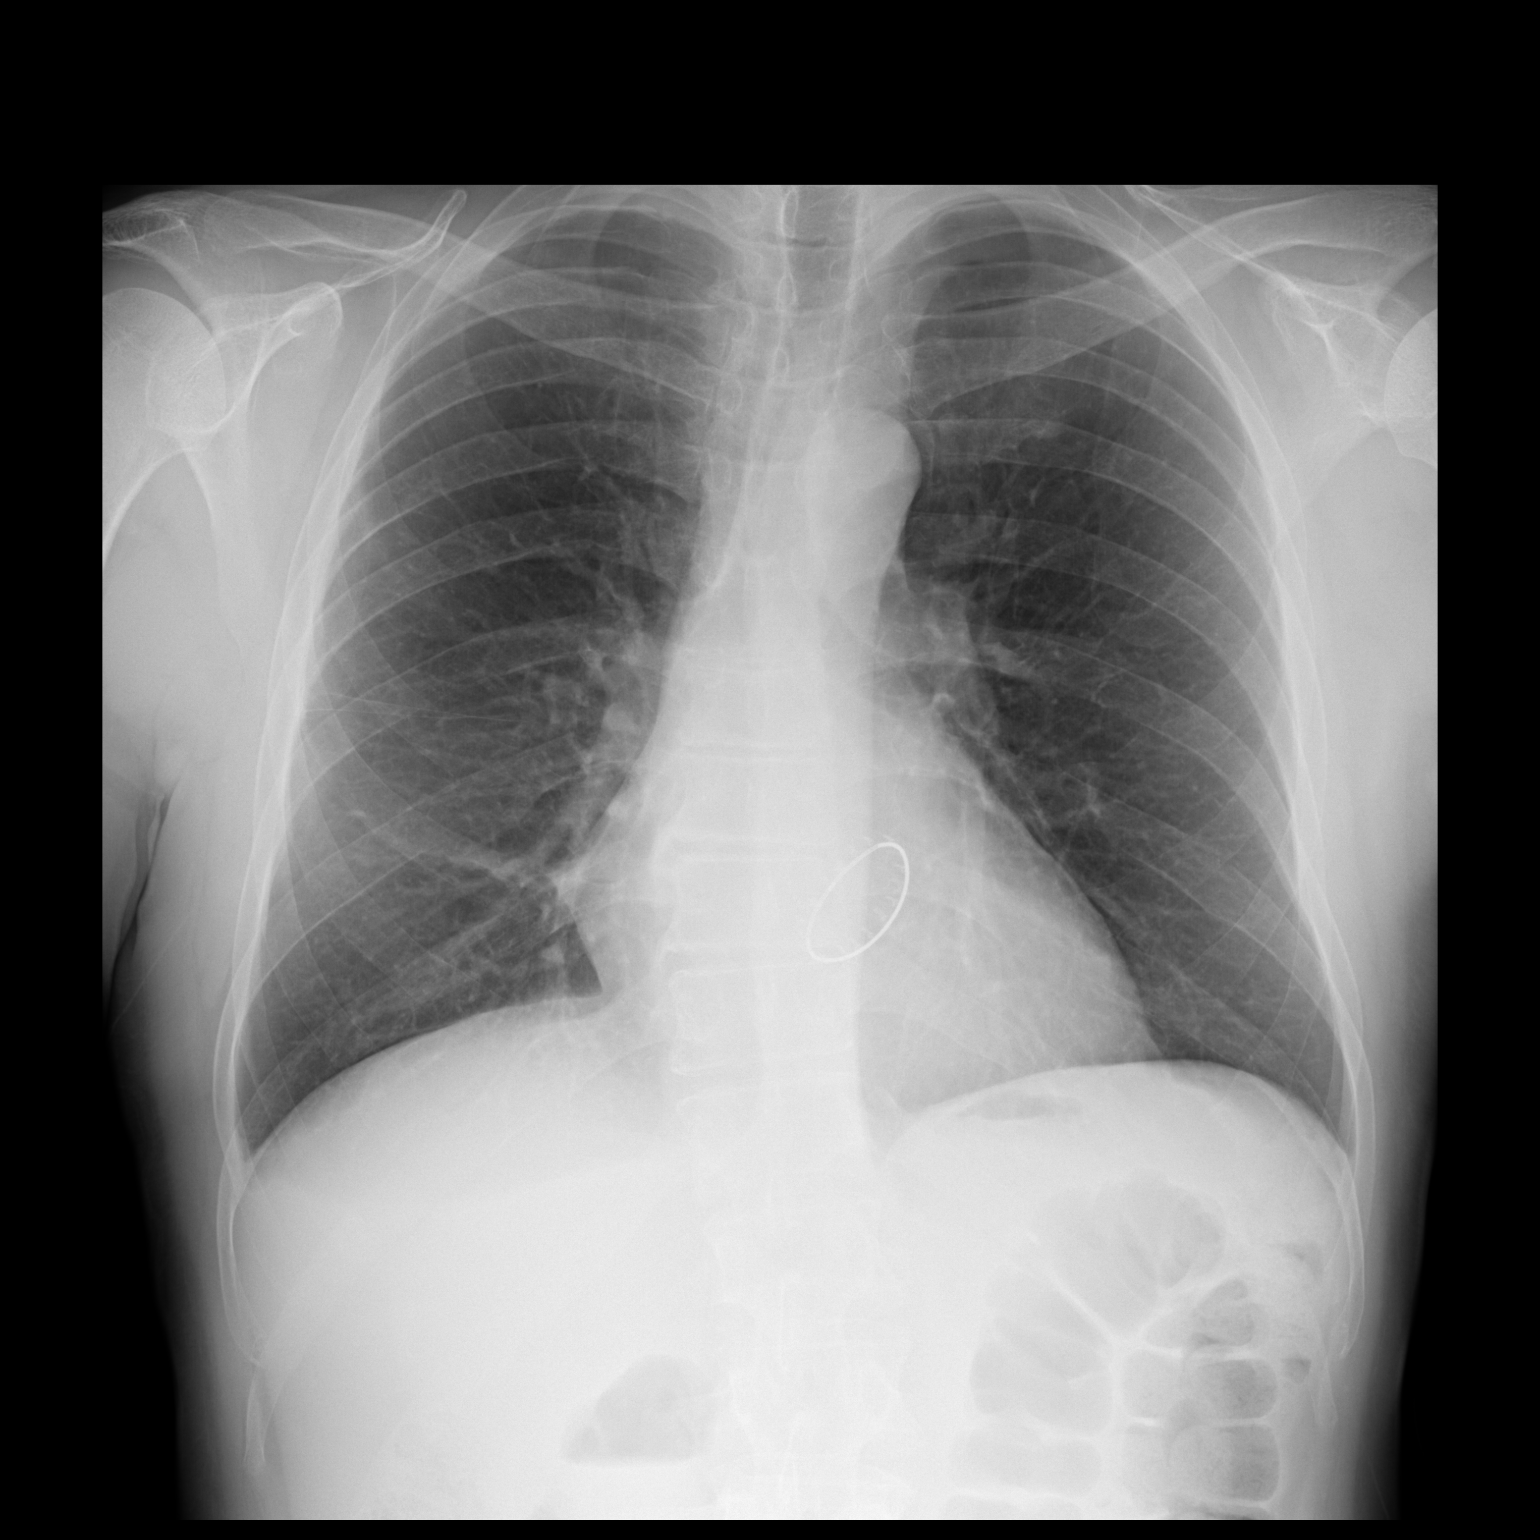

[dg chest 2 view (2 of 2)]
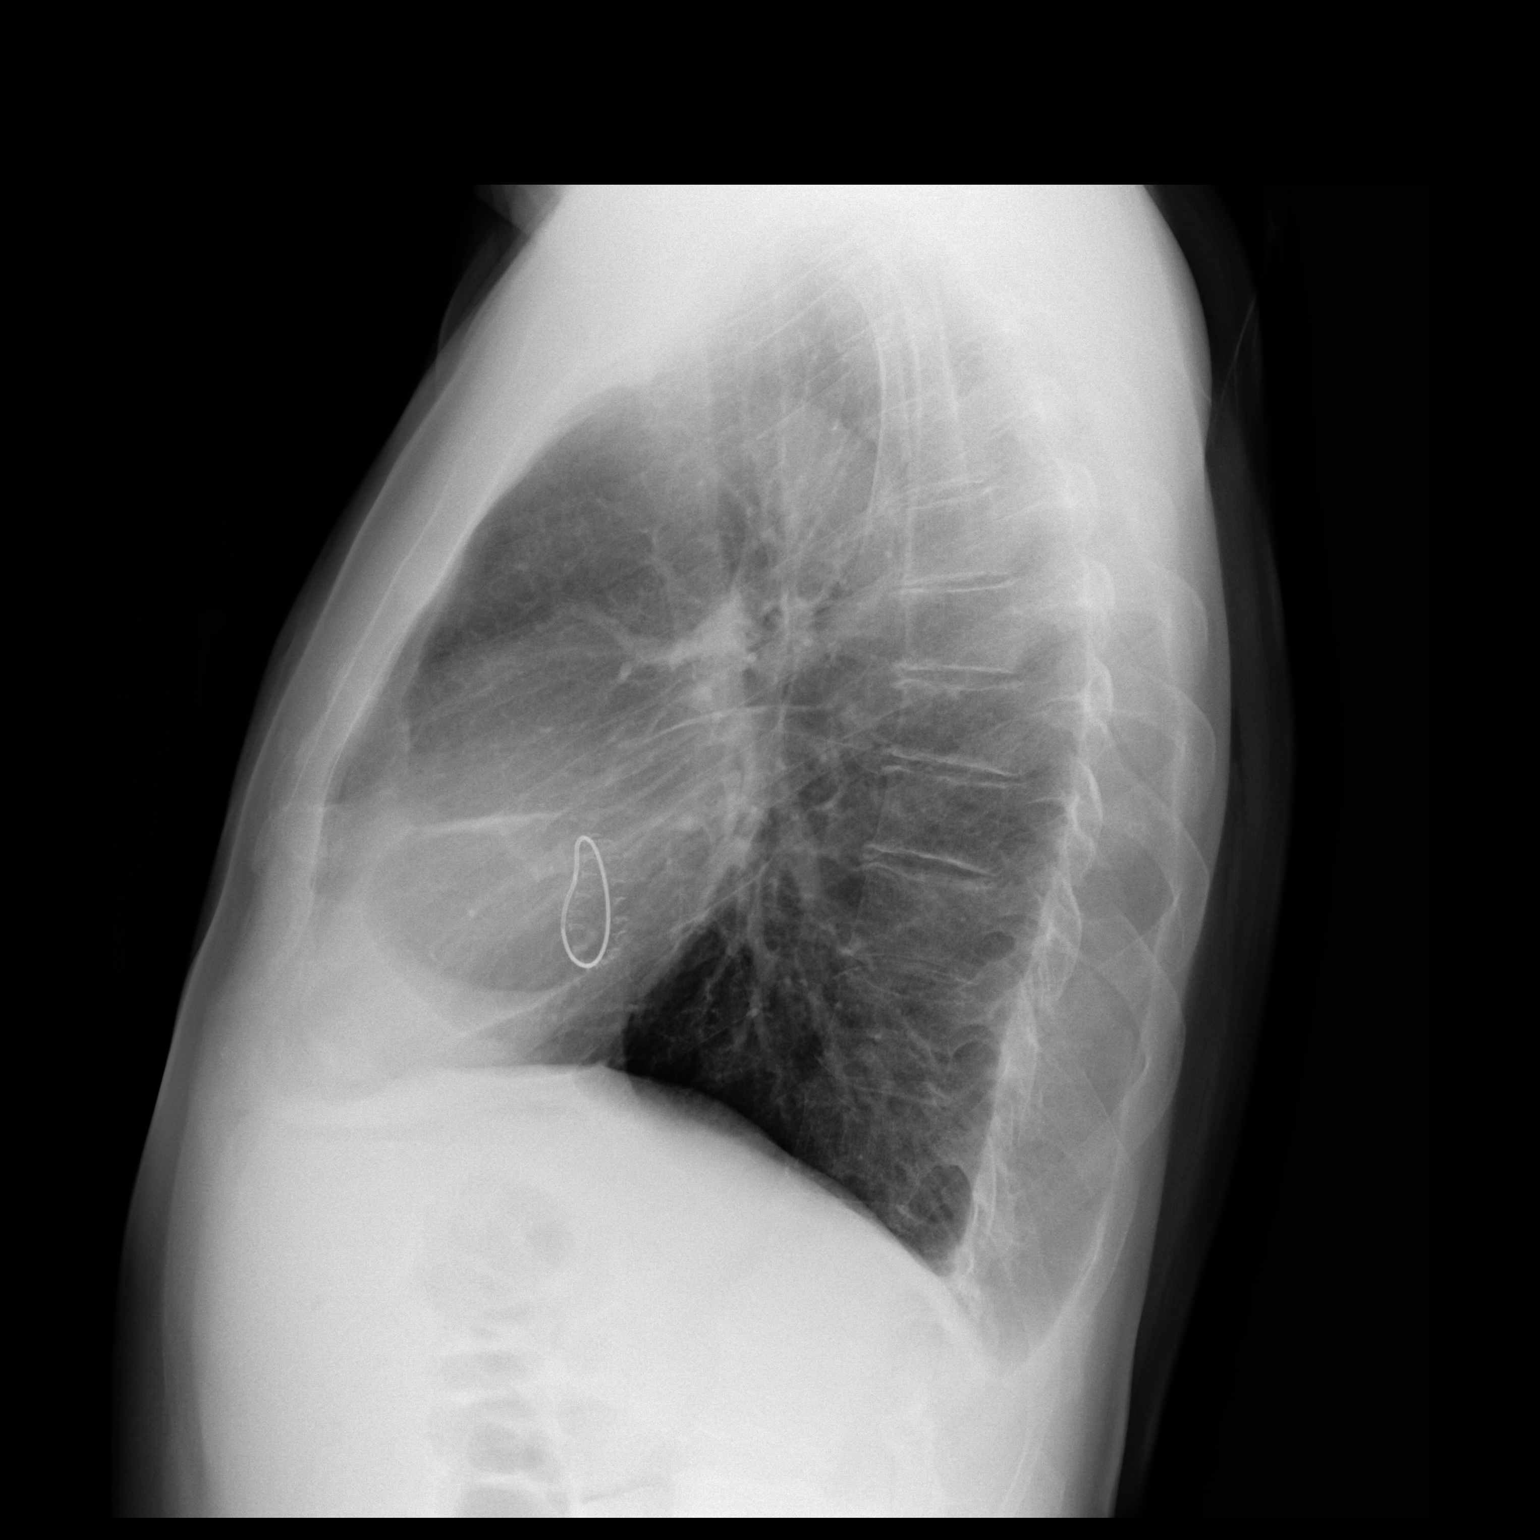

[2 of 2 positions shown; findings below may reference images not displayed]

FINDINGS: Cardiac shadow is stable. Postsurgical changes are again seen.
Minimal scarring is noted in the right base but significantly
improved when compared with the prior exam. No effusion is seen. No
bony abnormality is noted.
IMPRESSION: Minimal right basilar scarring.  No acute abnormality is seen.

## 2022-04-11 ENCOUNTER — Encounter: Payer: Self-pay | Admitting: Cardiovascular Disease

## 2022-04-19 ENCOUNTER — Ambulatory Visit: Payer: BC Managed Care – PPO | Attending: Nurse Practitioner | Admitting: Nurse Practitioner

## 2022-04-19 ENCOUNTER — Encounter: Payer: Self-pay | Admitting: Nurse Practitioner

## 2022-04-19 VITALS — BP 124/80 | HR 82 | Ht 69.0 in | Wt 162.0 lb

## 2022-04-19 DIAGNOSIS — I341 Nonrheumatic mitral (valve) prolapse: Secondary | ICD-10-CM

## 2022-04-19 DIAGNOSIS — I480 Paroxysmal atrial fibrillation: Secondary | ICD-10-CM | POA: Diagnosis not present

## 2022-04-19 DIAGNOSIS — Z9889 Other specified postprocedural states: Secondary | ICD-10-CM | POA: Diagnosis not present

## 2022-04-19 DIAGNOSIS — R002 Palpitations: Secondary | ICD-10-CM

## 2022-04-19 DIAGNOSIS — E782 Mixed hyperlipidemia: Secondary | ICD-10-CM

## 2022-04-19 DIAGNOSIS — I340 Nonrheumatic mitral (valve) insufficiency: Secondary | ICD-10-CM | POA: Diagnosis not present

## 2022-04-19 NOTE — Progress Notes (Signed)
Office Visit    Patient Name: Perry Lewis Date of Encounter: 04/19/2022  Primary Care Provider:  Eartha Inch, MD Primary Cardiologist:  Reatha Harps, MD  Chief Complaint    56 year old male with a history of mitral valve regurgitation s/p mitral valve repair in 11/2020, postop atrial fibrillation, hyperlipidemia, and rheumatoid arthritis who presents for follow-up related to mitral valve regurgitation.  Past Medical History    Past Medical History:  Diagnosis Date   Mitral regurgitation    Mitral valve prolapse    Rheumatoid arthritis (HCC)    S/P minimally invasive mitral valve repair 12/15/2020   Complex valvuloplasty including artificial Gore-tex neochord placement x10 with 34 mm Medtronic Simuform ring annuloplasty via right mini thoracotomy approach   Past Surgical History:  Procedure Laterality Date   BUBBLE STUDY  10/26/2020   Procedure: BUBBLE STUDY;  Surgeon: Sande Rives, MD;  Location: [facility] ENDOSCOPY;  Service: Cardiovascular;;   HERNIA REPAIR     LAPAROSCOPIC APPENDECTOMY N/A 11/27/2018   Procedure: APPENDECTOMY LAPAROSCOPIC;  Surgeon: Gaynelle Adu, MD;  Location: [facility] ORS;  Service: General;  Laterality: N/A;   MITRAL VALVE REPAIR Right 12/15/2020   Procedure: MINIMALLY INVASIVE MITRAL VALVE REPAIR (MVR);  Surgeon: Purcell Nails, MD;  Location: [facility] OR;  Service: Open Heart Surgery;  Laterality: Right;   TEE WITHOUT CARDIOVERSION N/A 10/26/2020   Procedure: TRANSESOPHAGEAL ECHOCARDIOGRAM (TEE);  Surgeon: Sande Rives, MD;  Location: [facility] ENDOSCOPY;  Service: Cardiovascular;  Laterality: N/A;   TEE WITHOUT CARDIOVERSION N/A 12/15/2020   Procedure: TRANSESOPHAGEAL ECHOCARDIOGRAM (TEE);  Surgeon: Purcell Nails, MD;  Location: [facility] OR;  Service: Open Heart Surgery;  Laterality: N/A;    Allergies  No Known Allergies  History of Present Illness    56 year old male with the above past medical history including mitral valve regurgitation s/p  mitral valve repair in 11/2020, postop atrial fibrillation, hyperlipidemia, and rheumatoid arthritis.  He has a history of mitral valve regurgitation with mitral valve prolapse s/p minimally invasive mitral valve repair in 11/2020.  He did develop postop atrial fibrillation with eventual restoration of NSR, not on anticoagulation. Postop echocardiogram in 01/2021 showed EF 55 to 60%, residual SAM with LVOT, more prominent with Valsalva maneuver.  He was asymptomatic.  His metoprolol was discontinued.  Repeat echocardiogram was recommended in 1 year. He was last seen in the office on 04/21/2021 and noted palpitations, as well as increased PVCs during cardiac rehab.  He also noted occasional orthostatic dizziness. 7-day ZIO monitor showed predominantly sinus rhythm, 6 runs of SVT, PVCs, no evidence of atrial fibrillation or other significant arrhythmia.   He presents today for follow-up. Since his last visit he has been stable from a cardiac standpoint.  He has joined a exercise group and is exercising 3 days a week with a combination of cardio and resistance training.  He denies any chest pain, shortness of breath. He continues to note occasional lightheadedness with position changes, denies presyncope, syncope. Overall, he reports feeling well denies any additional concerns today.  Home Medications    Current Outpatient Medications  Medication Sig Dispense Refill   amoxicillin (AMOXIL) 500 MG tablet Take 4 tablets 30-60 minutes before dental procedures. 4 tablet 3   aspirin EC 81 MG EC tablet Take 1 tablet (81 mg total) by mouth daily. Swallow whole. 30 tablet 11   folic acid (FOLVITE) 1 MG tablet Take 2 mg by mouth daily.     methotrexate (RHEUMATREX) 2.5 MG tablet Take 15  mg by mouth every Tuesday. Caution:Chemotherapy. Protect from light.     No current facility-administered medications for this visit.     Review of Systems    He denies chest pain, palpitations, dyspnea, pnd, orthopnea, n, v,  dizziness, syncope, edema, weight gain, or early satiety. All other systems reviewed and are otherwise negative except as noted above.   Physical Exam    VS:  BP 124/80 (BP Location: Left Arm, Patient Position: Sitting, Cuff Size: Normal)   Pulse 82   Ht 5\' 9"  (1.753 m)   Wt 162 lb (73.5 kg)   BMI 23.92 kg/m   GEN: Well nourished, well developed, in no acute distress. HEENT: normal. Neck: Supple, no JVD, carotid bruits, or masses. Cardiac: RRR, 3/6 murmur, no rubs, or gallops. No clubbing, cyanosis, edema.  Radials/DP/PT 2+ and equal bilaterally.  Respiratory:  Respirations regular and unlabored, clear to auscultation bilaterally. GI: Soft, nontender, nondistended, BS + x 4. MS: no deformity or atrophy. Skin: warm and dry, no rash. Neuro:  Strength and sensation are intact. Psych: Normal affect.  Accessory Clinical Findings    ECG personally reviewed by me today - NSR, 81 bpm - no acute changes.   Lab Results  Component Value Date   WBC 7.7 01/01/2021   HGB 10.4 (L) 01/01/2021   HCT 30.9 (L) 01/01/2021   MCV 92 01/01/2021   PLT 697 (H) 01/01/2021   Lab Results  Component Value Date   CREATININE 0.74 (L) 01/01/2021   BUN 9 01/01/2021   NA 135 01/01/2021   K 4.9 01/01/2021   CL 98 01/01/2021   CO2 24 01/01/2021   Lab Results  Component Value Date   ALT 57 (H) 01/01/2021   AST 45 (H) 01/01/2021   ALKPHOS 209 (H) 01/01/2021   BILITOT 0.4 01/01/2021   No results found for: "CHOL", "HDL", "LDLCALC", "LDLDIRECT", "TRIG", "CHOLHDL"  Lab Results  Component Value Date   HGBA1C 5.5 12/11/2020    Assessment & Plan    1. Mitral valve regurgitation/mitral valve prolapse: Echo in 01/2021 showed residual SAM with LVOT, more prominent with Valsalva maneuver.  Occasional lightheadedness when bending down, otherwise, asymptomatic. Will repeat echo for routine monitoring, continue SBE prophylaxis.   2. Post-op atrial fibrillation/palpitations: 7-day ZIO monitor in 05/2021 showed  predominantly sinus rhythm, 6 runs of SVT, PVCs, no evidence of atrial fibrillation or other significant arrhythmia. Denies any recent palpitations. Stable.   3. Hyperlipidemia: LDL was 131 in 06/2021.  Encouraged ongoing lifestyle modifications with diet and exercise.  Monitored and managed per PCP.   4. Disposition: Follow-up in 1 year.      Lenna Sciara, NP 04/19/2022, 8:48 AM

## 2022-04-19 NOTE — Patient Instructions (Signed)
Medication Instructions:  Your physician recommends that you continue on your current medications as directed. Please refer to the Current Medication list given to you today.  *If you need a refill on your cardiac medications before your next appointment, please call your pharmacy*   Lab Work: NONE ordered at this time of appointment   If you have labs (blood work) drawn today and your tests are completely normal, you will receive your results only by: [place] (if you have MyChart) OR A paper copy in the mail If you have any lab test that is abnormal or we need to change your treatment, we will call you to review the results.   Testing/Procedures: Your physician has requested that you have an echocardiogram. Echocardiography is a painless test that uses sound waves to create images of your heart. It provides your doctor with information about the size and shape of your heart and how well your heart's chambers and valves are working. This procedure takes approximately one hour. There are no restrictions for this procedure.    Follow-Up: At [facility], you and your health needs are our priority.  As part of our continuing mission to provide you with exceptional heart care, we have created designated Provider Care Teams.  These Care Teams include your primary Cardiologist (physician) and Advanced Practice Providers (APPs -  Physician Assistants and Nurse Practitioners) who all work together to provide you with the care you need, when you need it.  We recommend signing up for the patient portal called "MyChart".  Sign up information is provided on this After Visit Summary.  MyChart is used to connect with patients for Virtual Visits (Telemedicine).  Patients are able to view lab/test results, encounter notes, upcoming appointments, etc.  Non-urgent messages can be sent to your provider as well.   To learn more about what you can do with MyChart, go to [email or web address].     Your next appointment:   1 year(s)  The format for your next appointment:   In Person  Provider:   Evalina Field, MD     Other Instructions   Important Information About Sugar

## 2022-04-22 ENCOUNTER — Ambulatory Visit (INDEPENDENT_AMBULATORY_CARE_PROVIDER_SITE_OTHER): Payer: BC Managed Care – PPO

## 2022-04-22 DIAGNOSIS — I340 Nonrheumatic mitral (valve) insufficiency: Secondary | ICD-10-CM | POA: Diagnosis not present

## 2022-04-22 DIAGNOSIS — I341 Nonrheumatic mitral (valve) prolapse: Secondary | ICD-10-CM

## 2022-04-22 LAB — ECHOCARDIOGRAM COMPLETE
AR max vel: 3.45 cm2
AV Area VTI: 3.68 cm2
AV Area mean vel: 3.69 cm2
AV Mean grad: 6 mmHg
AV Peak grad: 10.8 mmHg
Ao pk vel: 1.64 m/s
Area-P 1/2: 3.68 cm2
Calc EF: 61.5 %
MV VTI: 3.32 cm2
S' Lateral: 2.84 cm
Single Plane A2C EF: 60.8 %
Single Plane A4C EF: 62.1 %

## 2022-04-29 DIAGNOSIS — Z79899 Other long term (current) drug therapy: Secondary | ICD-10-CM | POA: Diagnosis not present

## 2022-04-29 DIAGNOSIS — M0579 Rheumatoid arthritis with rheumatoid factor of multiple sites without organ or systems involvement: Secondary | ICD-10-CM | POA: Diagnosis not present

## 2022-05-04 ENCOUNTER — Telehealth: Payer: Self-pay

## 2022-05-04 NOTE — Telephone Encounter (Signed)
Spoke with pt. Pt was notified of echo results and recommendations. Pt will plan to follow up as directed.

## 2022-06-13 NOTE — Telephone Encounter (Signed)
Please advise if anything more is needed. I spoke with pt on 05/04/22 and discussed echo results.

## 2022-06-17 ENCOUNTER — Telehealth: Payer: Self-pay | Admitting: Cardiovascular Disease

## 2022-06-17 NOTE — Telephone Encounter (Signed)
Pt states he is returning Bernadene Person, NP phone call in regards to echo results. Requesting call back.

## 2022-06-17 NOTE — Telephone Encounter (Signed)
   Patient Name: Perry Lewis  DOB: March 12, 1966 MRN: [id number]  Primary Cardiologist: Reatha Harps, MD  Discussed most recent echocardiogram results with patient by phone and answered patient's questions.  Will plan for repeat echocardiogram in 1 year.  Continue current medications and follow-up as planned.  Joylene Grapes, NP 06/17/2022, 4:51 PM

## 2022-06-30 DIAGNOSIS — X32XXXD Exposure to sunlight, subsequent encounter: Secondary | ICD-10-CM | POA: Diagnosis not present

## 2022-06-30 DIAGNOSIS — L570 Actinic keratosis: Secondary | ICD-10-CM | POA: Diagnosis not present

## 2022-06-30 DIAGNOSIS — Z85828 Personal history of other malignant neoplasm of skin: Secondary | ICD-10-CM | POA: Diagnosis not present

## 2022-06-30 DIAGNOSIS — Z08 Encounter for follow-up examination after completed treatment for malignant neoplasm: Secondary | ICD-10-CM | POA: Diagnosis not present

## 2022-07-14 ENCOUNTER — Encounter: Payer: Self-pay | Admitting: Internal Medicine

## 2022-07-14 ENCOUNTER — Ambulatory Visit (INDEPENDENT_AMBULATORY_CARE_PROVIDER_SITE_OTHER): Payer: BC Managed Care – PPO | Admitting: Internal Medicine

## 2022-07-14 VITALS — BP 121/72 | HR 78 | Temp 98.2°F | Resp 12 | Ht 69.0 in | Wt 165.8 lb

## 2022-07-14 DIAGNOSIS — M0579 Rheumatoid arthritis with rheumatoid factor of multiple sites without organ or systems involvement: Secondary | ICD-10-CM | POA: Diagnosis not present

## 2022-07-14 DIAGNOSIS — Z119 Encounter for screening for infectious and parasitic diseases, unspecified: Secondary | ICD-10-CM

## 2022-07-14 DIAGNOSIS — Z860100 Personal history of colon polyps, unspecified: Secondary | ICD-10-CM

## 2022-07-14 DIAGNOSIS — I340 Nonrheumatic mitral (valve) insufficiency: Secondary | ICD-10-CM

## 2022-07-14 DIAGNOSIS — Z125 Encounter for screening for malignant neoplasm of prostate: Secondary | ICD-10-CM

## 2022-07-14 DIAGNOSIS — R351 Nocturia: Secondary | ICD-10-CM

## 2022-07-14 DIAGNOSIS — J309 Allergic rhinitis, unspecified: Secondary | ICD-10-CM | POA: Insufficient documentation

## 2022-07-14 DIAGNOSIS — Z0000 Encounter for general adult medical examination without abnormal findings: Secondary | ICD-10-CM | POA: Diagnosis not present

## 2022-07-14 NOTE — Patient Instructions (Signed)
It was a pleasure seeing you today!  Your health and satisfaction are my top priorities. If you believe your experience today was worthy of a 5-star rating, I'd be grateful for your feedback! Perry Olszewski, MD   CHECKOUT CHECKLIST  []    Schedule next appointment(s):    1 year(s) annual Any requested lab visits should be scheduled as appointments too  If you are not doing well:  Return to the office sooner Please bring all your medicine bottles to each appointment If your condition begins to worsen or become severe:  go to the emergency room or even call 911  []    Sign release of information authorizations: Any records we need for your care and to be your medical home  REMINDERS:  []   (Optional):  Review your clinical notes on MyChart after they are completed.     Today's draft of the physician documented plan for today's visit: (final revisions will be visible on MyChart chart later) Preventative health care -     CBC with Differential/Platelet; Future -     Comprehensive metabolic panel; Future -     Lipid panel; Future  Screening examination for infectious disease -     Hepatitis C antibody; Future -     HIV Antibody (routine testing w rflx); Future  Mitral valve insufficiency, unspecified etiology  Rheumatoid arthritis involving multiple sites with positive rheumatoid factor (HCC)  Prostate cancer screening -     PSA; Future  Nocturia      QUESTIONS & CONCERNS: CLINICAL: please contact via phone [phone] OR MyChart messaging  LAB & IMAGING:   We will call you if the results are significantly abnormal or you don't use MyChart.  Most normal results will be posted to MyChart immediately and have a clinical review message by Dr. posted within 2-3 business days.   If you have not heard from [place] regarding the results in 2 weeks OR if you need priority reporting, please contact this office. MYCHART:  The fastest way to get your results and easiest way to  stay in touch with [phone] is by activating your My Chart account. Instructions are located on the last page of this paperwork.  BILLING: xray and lab orders are billed from separate companies and questions./concerns should be directed to the invoicing company.  For visit charges please discuss with our administrative services COMPLAINTS:  please let Dr. Jon Billings know or see the [facility] Healthcare Practice Administrator - [place], by asking at the front desk: we want you to be satisfied with every experience and we would be grateful for the opportunity to address any problems

## 2022-07-14 NOTE — Progress Notes (Signed)
Adult nurse Healthcare at [facility]: [phone] Provider: Lula Olszewski, MD   Chief Complaint:   Completed employer required paperwork    Perry Lewis is a 56 y.o. assigned male at birth who presents today for his annual comprehensive physical exam.    Medical assistant also reports: Chief Complaint  Patient presents with   Annual Exam    Ordered labs for future and scheduled lab visit for a later time.   Lesions on scalp    Requests for them to be examined, possibly be removed.    Assessment/Plan:   Zebulen was seen today for annual exam and lesions on scalp.  Preventative health care -     CBC with Differential/Platelet; Future -     Comprehensive metabolic panel; Future -     Lipid panel; Future  Screening examination for infectious disease -     Hepatitis C antibody; Future -     HIV Antibody (routine testing w rflx); Future  Mitral valve insufficiency, unspecified etiology Overview: Improved some after repair may 2022 Horald Chestnut, since retired Still faulty, now managed by [facility] follow up by Dr. Scharlene Gloss.  Requires antibiotic b4 dental appointment from now on.  There was an implanted ring Murmur first detected in late 40s    Rheumatoid arthritis involving multiple sites with positive rheumatoid factor (HCC) Overview: Takes methotrexate long term and doesn't feel it in his hands on low dose methotrexate and folic acid.   Prostate cancer screening -     PSA; Future  Nocturia -     Urinalysis, Routine w reflex microscopic; Future  History of colon polyps -     Ambulatory referral to Gastroenterology   Today's Health Maintenance Counseling and Anticipatory Guidance:   Eye exams:  every 1-2 years recommended.  Having vision corrected can improve the quality of day-to-day life.  Eye specialists can detect certain eye conditions such as cataracts, glaucoma and age-related macular degeneration, which could lead to sight loss.   They can also detect certain rare cancers and diabetes, among other things. He already does. Dental health: Discussed importance of regular tooth brushing, flossing, and dental visits q6 months.  Poor dentition can lead to serious medical problems - particularly problems with heart valves. Sinus health: Encourage sterile saline nasal misting sinus rinses daily for pollen, to reduce allergies and risk for sinus infections.   Cardiovascular Risk Factor Reduction:   Advised patient of need for regular exercise and diet rich and fruits and vegetables and healthy fats to reduce risk of heart attack and stroke. Avoid first- and second-hand smoke and stimulants.   Avoid extreme exercise- exercise in moderation (150 minutes per week is a good goal) Wt Readings from Last 3 Encounters:  07/14/22 165 lb 12.8 oz (75.2 kg)  04/19/22 162 lb (73.5 kg)  05/03/21 160 lb 7.9 oz (72.8 kg)   Body mass index is 24.48 kg/m. / healthy BP Readings from Last 3 Encounters:  07/14/22 121/72  04/19/22 124/80  04/21/21 124/72    He reports his diet consists of avoids salts, makes constant effort to eat fruits and veggies. Daughter is vegan by choice who has been teaching those things.  He reports his exercise consists of 3 mile bike rides, [employer] 3x weekly, treadmilling mod run fast walk 30 min and strength training 45 minutes after that and he has muscle definitiion in upper body he never had in early years.  Health maintenance and immunizations reviewed and he was encouraged to  complete anything that is due: Immunization History  Administered Date(s) Administered   COVID-19, mRNA, vaccine(Comirnaty)12 years and older 05/03/2022   Influenza, Quadrivalent, Recombinant, Inj, Pf 05/01/2018, 04/17/2019, 05/05/2020, 05/27/2021   Influenza, Seasonal, Injecte, Preservative Fre 05/18/2007   Influenza-Unspecified 05/18/2007, 05/02/2020, 05/03/2022   Moderna Covid-19 Vaccine Bivalent Booster 23yrs & up 06/21/2021    PFIZER(Purple Top)SARS-COV-2 Vaccination 11/08/2019, 11/30/2019, 05/05/2020   Pfizer Covid-19 Vaccine Bivalent Booster 89yrs & up 12/01/2020   Tdap 07/05/2004, 08/14/2015, 07/01/2016   Zoster Recombinat (Shingrix) 05/07/2021, 07/12/2021   Health Maintenance Due  Topic Date Due   HIV Screening  Never done   Hepatitis C Screening  Never done   COLONOSCOPY (Pts 45-52yrs Insurance coverage will need to be confirmed)  Never done   COVID-19 Vaccine (7 - 2023-24 season) 06/28/2022    Penile cancer screening: Asked about genital warts or tumors/abnormalities of penis.  Testicular cancer screening: Patient was advised to palpate testicles, scrotum, penis for masses and inform me of an Prostate cancer screening:  Denies family history of prostate cancer or hematospermia so too young for screening by current guidelines.doesn't know family so would prefer PSA for that reason No results found for: "PSA"     STD screening: testing offered today, but patient declined as He considers themself to be low risk based on his sexual history    Sleep Apnea screening:  He denies any significant problems with sleep quality or hypersomnolence or being advised that he has been told that he snores or has apnea Colon cancer screening:    see gastrointestinal referral  Skin cancer screening-  Advised regular sunscreen use. He denies worrisome, changing, or new skin lesions. Showed him pictures of melanomas for reference: .seen by derm and has many spots might return to get cryo. Substance use:  I discussed that my recommendation is total abstinence from all substances of abuse including smoke and 2nd hand smoke, alcohol, illicit drugs, smoking, inhalants, sugar.   Offered to assist with any use disorders or addictions.   He reduced drinking with methotrexate- 5-6 drinks on weekend. Injury prevention:  Discussed safety belts, safety helmets, smoke detectors. Return to care in 1 year for next preventative visit.   Lula Olszewski, MD     Subjective:   See problem-oriented charting in (overview sections of assessment & plan) for updated chart information added to chronic problems for future tracking  He has no acute complaints today.   Review of Systems  Constitutional:  Negative for chills, diaphoresis, fever, malaise/fatigue and weight loss.  HENT:  Negative for congestion, ear discharge, ear pain, hearing loss, nosebleeds, sinus pain, sore throat and tinnitus.   Eyes:  Negative for blurred vision, double vision, photophobia, pain, discharge and redness.  Respiratory:  Negative for cough, hemoptysis, sputum production, shortness of breath, wheezing and stridor.   Cardiovascular:  Negative for chest pain, palpitations, orthopnea, claudication, leg swelling and PND.  Gastrointestinal:  Negative for abdominal pain, blood in stool, constipation, diarrhea, heartburn, melena, nausea and vomiting.  Genitourinary:  Negative for dysuria, flank pain, frequency, hematuria and urgency.  Musculoskeletal:  Negative for back pain, falls, joint pain, myalgias and neck pain.  Skin:  Negative for itching and rash.  Neurological:  Negative for dizziness, tingling, tremors, sensory change, speech change, focal weakness, seizures, loss of consciousness, weakness and headaches.  Endo/Heme/Allergies:  Negative for environmental allergies and polydipsia. Does not bruise/bleed easily.  Psychiatric/Behavioral:  Negative for depression, hallucinations, memory loss, substance abuse and suicidal ideas. The patient is  not nervous/anxious and does not have insomnia.      I attest that I have reviewed and confirmed the patients current medications to meet the medication reconciliation requirement  Current Outpatient Medications  Medication Sig Dispense Refill   aspirin EC 81 MG EC tablet Take 1 tablet (81 mg total) by mouth daily. Swallow whole. 30 tablet 11   folic acid (FOLVITE) 1 MG tablet Take 2 mg by mouth daily.      methotrexate (RHEUMATREX) 2.5 MG tablet Take 15 mg by mouth every Tuesday. Caution:Chemotherapy. Protect from light.     No current facility-administered medications for this visit.    The following were reviewed and entered/updated in epic:    07/14/2022    3:30 PM  Depression screen PHQ 2/9  Decreased Interest 0  Down, Depressed, Hopeless 0  PHQ - 2 Score 0   Past Medical History:  Diagnosis Date   Heart murmur 2015   mitral valve repr 11/2020   Mitral regurgitation    Mitral valve prolapse    Rheumatoid arthritis (HCC)    S/P minimally invasive mitral valve repair 12/15/2020   Complex valvuloplasty including artificial Gore-tex neochord placement x10 with 34 mm Medtronic Simuform ring annuloplasty via right mini thoracotomy approach   Patient Active Problem List   Diagnosis Date Noted   Allergic rhinitis 07/14/2022   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   Mitral regurgitation    Rheumatoid arthritis (HCC)    Rheumatoid arthritis involving multiple sites with positive rheumatoid factor (HCC) 06/05/2019   Seasonal allergies 04/17/2019   Varicose veins of right lower extremity 04/17/2019   Hypertriglyceridemia 01/08/2019   Mitral valve insufficiency 01/08/2019   Mitral valve prolapse 01/08/2019   Acute appendicitis s/p lap appendectomy 11/28/2018 11/28/2018   Allergic conjunctivitis 09/26/2013   Borderline blood pressure 09/26/2013   Heart murmur 09/26/2013   Past Surgical History:  Procedure Laterality Date   APPENDECTOMY  2020   surgery 11/2018   BUBBLE STUDY  10/26/2020   Procedure: BUBBLE STUDY;  Surgeon: Sande Rives, MD;  Location: [facility] ENDOSCOPY;  Service: Cardiovascular;;   EYE SURGERY  1995   radial keratotomy   HERNIA REPAIR     LAPAROSCOPIC APPENDECTOMY N/A 11/27/2018   Procedure: APPENDECTOMY LAPAROSCOPIC;  Surgeon: Gaynelle Adu, MD;  Location: [facility] ORS;  Service: General;  Laterality: N/A;    MITRAL VALVE REPAIR Right 12/15/2020   Procedure: MINIMALLY INVASIVE MITRAL VALVE REPAIR (MVR);  Surgeon: Purcell Nails, MD;  Location: [facility] OR;  Service: Open Heart Surgery;  Laterality: Right;   TEE WITHOUT CARDIOVERSION N/A 10/26/2020   Procedure: TRANSESOPHAGEAL ECHOCARDIOGRAM (TEE);  Surgeon: Sande Rives, MD;  Location: [facility] ENDOSCOPY;  Service: Cardiovascular;  Laterality: N/A;   TEE WITHOUT CARDIOVERSION N/A 12/15/2020   Procedure: TRANSESOPHAGEAL ECHOCARDIOGRAM (TEE);  Surgeon: Purcell Nails, MD;  Location: [facility] OR;  Service: Open Heart Surgery;  Laterality: N/A;   Family History  Problem Relation Age of Onset   Depression Mother    Early death Mother    Varicose Veins Mother    Alcohol abuse Maternal Grandfather    Alcohol abuse Maternal Grandmother    No Known Allergies Social History   Tobacco Use   Smoking status: Former    Packs/day: 1.00    Years: 7.00    Total pack years: 7.00    Types: Cigarettes    Quit date: 08/01/1988    Years since quitting: 43.9  Smokeless tobacco: Never  Vaping Use   Vaping Use: Never used  Substance Use Topics   Alcohol use: Yes    Alcohol/week: 6.0 standard drinks of alcohol    Types: 6 Cans of beer per week    Comment: 2x week   Drug use: Not Currently    Types: Marijuana           Objective:  Physical Exam: BP 121/72 (BP Location: Left Arm, Patient Position: Sitting)   Pulse 78   Temp 98.2 F (36.8 C) (Temporal)   Resp 12   Ht 5\' 9"  (1.753 m)   Wt 165 lb 12.8 oz (75.2 kg)   SpO2 99%   BMI 24.48 kg/m   Body mass index is 24.48 kg/m. Wt Readings from Last 3 Encounters:  07/14/22 165 lb 12.8 oz (75.2 kg)  04/19/22 162 lb (73.5 kg)  05/03/21 160 lb 7.9 oz (72.8 kg)   Vital signs reviewed.  Nursing notes reviewed. General Appearance/Constitutional:  Well developed, well nourished male in no acute distress.  He is authentic, earnest, and polite. HEENT:  normocephalic, atraumatic, conjunctiva  clear Pulmonary:   normal work of breathing, no respiratory distress Cardiovascular:  Normal heart rate. Normal rhythm.  1/6 blowing, holosystolic murmur at apex hardly discernible Musculoskeletal: All extremities are intact.   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 Offered to apply cryogun to it later if he is interested

## 2022-07-20 ENCOUNTER — Other Ambulatory Visit: Payer: BC Managed Care – PPO

## 2022-07-20 DIAGNOSIS — Z79899 Other long term (current) drug therapy: Secondary | ICD-10-CM | POA: Diagnosis not present

## 2022-07-20 DIAGNOSIS — M71349 Other bursal cyst, unspecified hand: Secondary | ICD-10-CM | POA: Diagnosis not present

## 2022-07-20 DIAGNOSIS — M0579 Rheumatoid arthritis with rheumatoid factor of multiple sites without organ or systems involvement: Secondary | ICD-10-CM | POA: Diagnosis not present

## 2022-07-30 ENCOUNTER — Encounter: Payer: Self-pay | Admitting: Internal Medicine

## 2022-08-08 ENCOUNTER — Other Ambulatory Visit (INDEPENDENT_AMBULATORY_CARE_PROVIDER_SITE_OTHER): Payer: BC Managed Care – PPO

## 2022-08-08 DIAGNOSIS — Z125 Encounter for screening for malignant neoplasm of prostate: Secondary | ICD-10-CM

## 2022-08-08 DIAGNOSIS — Z0000 Encounter for general adult medical examination without abnormal findings: Secondary | ICD-10-CM

## 2022-08-08 DIAGNOSIS — R351 Nocturia: Secondary | ICD-10-CM

## 2022-08-08 DIAGNOSIS — Z119 Encounter for screening for infectious and parasitic diseases, unspecified: Secondary | ICD-10-CM

## 2022-08-08 LAB — COMPREHENSIVE METABOLIC PANEL
ALT: 23 U/L (ref 0–53)
AST: 23 U/L (ref 0–37)
Albumin: 4.5 g/dL (ref 3.5–5.2)
Alkaline Phosphatase: 70 U/L (ref 39–117)
BUN: 13 mg/dL (ref 6–23)
CO2: 29 mEq/L (ref 19–32)
Calcium: 9.8 mg/dL (ref 8.4–10.5)
Chloride: 98 mEq/L (ref 96–112)
Creatinine, Ser: 0.84 mg/dL (ref 0.40–1.50)
GFR: 97.05 mL/min (ref >60.00)
Glucose, Bld: 100 mg/dL — ABNORMAL HIGH (ref 70–99)
Potassium: 4.6 mEq/L (ref 3.5–5.1)
Sodium: 136 mEq/L (ref 135–145)
Total Bilirubin: 0.6 mg/dL (ref 0.2–1.2)
Total Protein: 7 g/dL (ref 6.0–8.3)

## 2022-08-08 LAB — CBC WITH DIFFERENTIAL/PLATELET
Basophils Absolute: 0 10*3/uL (ref 0.0–0.1)
Basophils Relative: 0.3 % (ref 0.0–3.0)
Eosinophils Absolute: 0.1 10*3/uL (ref 0.0–0.7)
Eosinophils Relative: 1.3 % (ref 0.0–5.0)
HCT: 39.9 % (ref 39.0–52.0)
Hemoglobin: 13.9 g/dL (ref 13.0–17.0)
Lymphocytes Relative: 33.7 % (ref 12.0–46.0)
Lymphs Abs: 2 10*3/uL (ref 0.7–4.0)
MCHC: 34.8 g/dL (ref 30.0–36.0)
MCV: 92.9 fl (ref 78.0–100.0)
Monocytes Absolute: 0.5 10*3/uL (ref 0.1–1.0)
Monocytes Relative: 7.7 % (ref 3.0–12.0)
Neutro Abs: 3.5 10*3/uL (ref 1.4–7.7)
Neutrophils Relative %: 57 % (ref 43.0–77.0)
Platelets: 333 10*3/uL (ref 150.0–400.0)
RBC: 4.3 Mil/uL (ref 4.22–5.81)
RDW: 13.5 % (ref 11.5–15.5)
WBC: 6.1 10*3/uL (ref 4.0–10.5)

## 2022-08-08 LAB — URINALYSIS, ROUTINE W REFLEX MICROSCOPIC
Bilirubin Urine: NEGATIVE
Hgb urine dipstick: NEGATIVE
Ketones, ur: NEGATIVE
Leukocytes,Ua: NEGATIVE
Nitrite: NEGATIVE
RBC / HPF: NONE SEEN (ref 0–?)
Specific Gravity, Urine: 1.015 (ref 1.000–1.030)
Total Protein, Urine: NEGATIVE
Urine Glucose: NEGATIVE
Urobilinogen, UA: 0.2 (ref 0.0–1.0)
pH: 7 (ref 5.0–8.0)

## 2022-08-08 LAB — LIPID PANEL
Cholesterol: 189 mg/dL (ref 0–200)
HDL: 48.8 mg/dL (ref >39.00)
LDL Cholesterol: 122 mg/dL — ABNORMAL HIGH (ref 0–99)
NonHDL: 140.6
Total CHOL/HDL Ratio: 4
Triglycerides: 91 mg/dL (ref 0.0–149.0)
VLDL: 18.2 mg/dL (ref 0.0–40.0)

## 2022-08-08 LAB — PSA: PSA: 0.35 ng/mL (ref 0.10–4.00)

## 2022-08-09 ENCOUNTER — Encounter: Payer: Self-pay | Admitting: Internal Medicine

## 2022-08-09 DIAGNOSIS — D649 Anemia, unspecified: Secondary | ICD-10-CM | POA: Insufficient documentation

## 2022-08-09 LAB — HIV ANTIBODY (ROUTINE TESTING W REFLEX): HIV 1&2 Ab, 4th Generation: NONREACTIVE

## 2022-08-09 LAB — HEPATITIS C ANTIBODY: Hepatitis C Ab: NONREACTIVE

## 2022-08-09 NOTE — Progress Notes (Signed)
Be happy all the labs pretty norma... except there is a mild anemia for a year now that needs worked up... also the platelets are really high on this check ... They jump up high like that with inflammation sometime. Recommmend recheck and have a discussion appointment about it 2-6 weeks.

## 2022-08-15 ENCOUNTER — Other Ambulatory Visit: Payer: Self-pay

## 2022-08-23 DIAGNOSIS — M79604 Pain in right leg: Secondary | ICD-10-CM | POA: Diagnosis not present

## 2022-08-23 DIAGNOSIS — R600 Localized edema: Secondary | ICD-10-CM | POA: Diagnosis not present

## 2022-08-23 DIAGNOSIS — M79661 Pain in right lower leg: Secondary | ICD-10-CM | POA: Diagnosis not present

## 2022-08-23 DIAGNOSIS — I83891 Varicose veins of right lower extremities with other complications: Secondary | ICD-10-CM | POA: Diagnosis not present

## 2022-10-12 DIAGNOSIS — I83891 Varicose veins of right lower extremities with other complications: Secondary | ICD-10-CM | POA: Diagnosis not present

## 2022-10-19 DIAGNOSIS — Z09 Encounter for follow-up examination after completed treatment for conditions other than malignant neoplasm: Secondary | ICD-10-CM | POA: Diagnosis not present

## 2022-10-19 DIAGNOSIS — I83891 Varicose veins of right lower extremities with other complications: Secondary | ICD-10-CM | POA: Diagnosis not present

## 2022-11-09 DIAGNOSIS — I872 Venous insufficiency (chronic) (peripheral): Secondary | ICD-10-CM | POA: Diagnosis not present

## 2022-11-11 DIAGNOSIS — Z79899 Other long term (current) drug therapy: Secondary | ICD-10-CM | POA: Diagnosis not present

## 2022-11-11 DIAGNOSIS — M0579 Rheumatoid arthritis with rheumatoid factor of multiple sites without organ or systems involvement: Secondary | ICD-10-CM | POA: Diagnosis not present

## 2022-11-24 DIAGNOSIS — I83892 Varicose veins of left lower extremities with other complications: Secondary | ICD-10-CM | POA: Diagnosis not present

## 2022-11-24 DIAGNOSIS — I83891 Varicose veins of right lower extremities with other complications: Secondary | ICD-10-CM | POA: Diagnosis not present

## 2022-12-27 DIAGNOSIS — L814 Other melanin hyperpigmentation: Secondary | ICD-10-CM | POA: Diagnosis not present

## 2022-12-27 DIAGNOSIS — D225 Melanocytic nevi of trunk: Secondary | ICD-10-CM | POA: Diagnosis not present

## 2022-12-27 DIAGNOSIS — L821 Other seborrheic keratosis: Secondary | ICD-10-CM | POA: Diagnosis not present

## 2022-12-27 DIAGNOSIS — L570 Actinic keratosis: Secondary | ICD-10-CM | POA: Diagnosis not present

## 2022-12-27 DIAGNOSIS — L218 Other seborrheic dermatitis: Secondary | ICD-10-CM | POA: Diagnosis not present

## 2023-01-10 ENCOUNTER — Other Ambulatory Visit: Payer: Self-pay | Admitting: Cardiovascular Disease

## 2023-01-16 MED ORDER — AMOXICILLIN 500 MG PO TABS: ORAL_TABLET | ORAL | 2 refills | Status: DC

## 2023-01-23 NOTE — Telephone Encounter (Signed)
Spoke with patient and this has already been taken care of by his Cardiologist. I have refused this request and there is nothing further to be done.

## 2023-02-21 DIAGNOSIS — M79641 Pain in right hand: Secondary | ICD-10-CM | POA: Diagnosis not present

## 2023-02-21 DIAGNOSIS — M19042 Primary osteoarthritis, left hand: Secondary | ICD-10-CM | POA: Diagnosis not present

## 2023-02-21 DIAGNOSIS — M19041 Primary osteoarthritis, right hand: Secondary | ICD-10-CM | POA: Diagnosis not present

## 2023-02-21 DIAGNOSIS — M0579 Rheumatoid arthritis with rheumatoid factor of multiple sites without organ or systems involvement: Secondary | ICD-10-CM | POA: Diagnosis not present

## 2023-02-21 DIAGNOSIS — M79642 Pain in left hand: Secondary | ICD-10-CM | POA: Diagnosis not present

## 2023-02-21 DIAGNOSIS — Z79899 Other long term (current) drug therapy: Secondary | ICD-10-CM | POA: Diagnosis not present

## 2023-02-21 DIAGNOSIS — Z1330 Encounter for screening examination for mental health and behavioral disorders, unspecified: Secondary | ICD-10-CM | POA: Diagnosis not present

## 2023-05-09 ENCOUNTER — Encounter: Payer: Self-pay | Admitting: Cardiovascular Disease

## 2023-05-09 DIAGNOSIS — I480 Paroxysmal atrial fibrillation: Secondary | ICD-10-CM

## 2023-05-09 DIAGNOSIS — I340 Nonrheumatic mitral (valve) insufficiency: Secondary | ICD-10-CM

## 2023-05-09 DIAGNOSIS — Z9889 Other specified postprocedural states: Secondary | ICD-10-CM

## 2023-05-09 DIAGNOSIS — I341 Nonrheumatic mitral (valve) prolapse: Secondary | ICD-10-CM

## 2023-05-10 NOTE — Telephone Encounter (Signed)
Ok to schedule echo and follow-up appt.Thanks, -EM

## 2023-05-17 ENCOUNTER — Ambulatory Visit (HOSPITAL_BASED_OUTPATIENT_CLINIC_OR_DEPARTMENT_OTHER): Payer: BC Managed Care – PPO

## 2023-05-17 DIAGNOSIS — I340 Nonrheumatic mitral (valve) insufficiency: Secondary | ICD-10-CM | POA: Diagnosis not present

## 2023-05-17 DIAGNOSIS — I341 Nonrheumatic mitral (valve) prolapse: Secondary | ICD-10-CM | POA: Diagnosis not present

## 2023-05-17 DIAGNOSIS — I480 Paroxysmal atrial fibrillation: Secondary | ICD-10-CM | POA: Diagnosis not present

## 2023-05-17 DIAGNOSIS — Z9889 Other specified postprocedural states: Secondary | ICD-10-CM

## 2023-05-17 LAB — ECHOCARDIOGRAM COMPLETE

## 2023-05-23 ENCOUNTER — Encounter (HOSPITAL_BASED_OUTPATIENT_CLINIC_OR_DEPARTMENT_OTHER): Payer: Self-pay

## 2023-06-15 DIAGNOSIS — Z01818 Encounter for other preprocedural examination: Secondary | ICD-10-CM | POA: Diagnosis not present

## 2023-06-15 DIAGNOSIS — I872 Venous insufficiency (chronic) (peripheral): Secondary | ICD-10-CM | POA: Diagnosis not present

## 2023-06-21 DIAGNOSIS — L570 Actinic keratosis: Secondary | ICD-10-CM | POA: Diagnosis not present

## 2023-06-21 DIAGNOSIS — D2261 Melanocytic nevi of right upper limb, including shoulder: Secondary | ICD-10-CM | POA: Diagnosis not present

## 2023-06-21 DIAGNOSIS — D225 Melanocytic nevi of trunk: Secondary | ICD-10-CM | POA: Diagnosis not present

## 2023-06-21 DIAGNOSIS — D2262 Melanocytic nevi of left upper limb, including shoulder: Secondary | ICD-10-CM | POA: Diagnosis not present

## 2023-06-27 DIAGNOSIS — D84821 Immunodeficiency due to drugs: Secondary | ICD-10-CM | POA: Diagnosis not present

## 2023-06-27 DIAGNOSIS — M0579 Rheumatoid arthritis with rheumatoid factor of multiple sites without organ or systems involvement: Secondary | ICD-10-CM | POA: Diagnosis not present

## 2023-06-27 DIAGNOSIS — Z79899 Other long term (current) drug therapy: Secondary | ICD-10-CM | POA: Diagnosis not present

## 2023-07-04 NOTE — H&P (View-Only) (Signed)
Cardiology Office Note:  .   Date:  07/06/2023  ID:  Perry Lewis, DOB Apr 18, 1966, MRN [id number] PCP: Lula Olszewski, MD  [facility] Providers Cardiologist:  Reatha Harps, MD { History of Present Illness: .   Perry Lewis is a 57 y.o. male with history of MV repair who presents for follow-up.   History of Present Illness   Perry Lewis, a 57 year old individual with a history of mitral valve repair, rheumatoid arthritis, and hyperlipidemia, presents for a follow-up visit. The patient underwent mitral valve repair in May 2022 and has since been maintaining regular physical activity, including gym visits. However, he reports experiencing shortness of breath during exertion, such as when using a treadmill or climbing stairs. This symptom was present before the surgery and continues to affect his daily life. Additionally, the patient reports a sensation of pressure change when tying his shoes or standing up, which he believes is related to the outflow tract obstruction. Despite these symptoms, the patient does not feel that his quality of life is significantly impacted to warrant a redo procedure at this time. The patient also has rheumatoid arthritis, which is managed with methotrexate. He reports soreness in his thumb joints, which his rheumatologist believes might be osteoarthritis.          Problem List 1. Severe mitral regurgitation -s/p valvuloplasty/34 mm annuloplasty ring (12/15/2020 Dr. Cornelius Moras) -CCTA normal (10/07/2020) -EF 50-55% 12/17/2020 -EF 55-60% 05/17/2023 --residual SAM with LVOT obstruction (9 mmHG at rest; 40 mmHG w/valsalva; 2/2 excessive PMVL) -SAM gradients improved 02/03/2021 -> 9 mmHG 2. RA 3. HLD -T chol 216, LDL 141, TG 163, HDL 42 4. Postoperative atrial fibrillation  -12/31/2020 5. RA    ROS: All other ROS reviewed and negative. Pertinent positives noted in the HPI.     Studies Reviewed: Marland Kitchen   EKG Interpretation Date/Time:  Thursday July 06 2023 11:05:15 EST Ventricular Rate:  78 PR Interval:  166 QRS Duration:  84 QT Interval:  366 QTC Calculation: 417 R Axis:   -7  Text Interpretation: Normal sinus rhythm Normal ECG Confirmed by Lennie Odor [phone]) on 07/06/2023 11:55:06 AM    TTE 05/17/2023  1. Left ventricular ejection fraction, by estimation, is 55 to 60%. The  left ventricle has normal function. The left ventricle has no regional  wall motion abnormalities. There is mild concentric left ventricular  hypertrophy. Left ventricular diastolic  function could not be evaluated. The average left ventricular global  longitudinal strain is -18.8 %. The global longitudinal strain is normal.   2. Right ventricular systolic function is normal. The right ventricular  size is moderately enlarged.   3. Left atrial size was moderately dilated.   4. Status post 34 mm medtronic simuform ring. The bioprosthetic mitral  valve appears to be thickened and the posterior cord is mildly tethered  suspect due to thickening of the neochord. The mitral valve has been  repaired/replaced. Mild to moderate  mitral valve regurgitation. No evidence of mitral stenosis. There is a 34  present in the mitral position.   5. Tricuspid valve regurgitation is mild to moderate.   6. The aortic valve is tricuspid. Aortic valve regurgitation is not  visualized. Aortic valve sclerosis/calcification is present, without any  evidence of aortic stenosis.   7. The inferior vena cava is normal in size with greater than 50%  respiratory variability, suggesting right atrial pressure of 3 mmHg.  Physical Exam:   VS:  BP 130/78 (BP Location: Left  Arm, Patient Position: Sitting, Cuff Size: Normal)   Pulse 78   Ht 5\' 9"  (1.753 m)   Wt 164 lb (74.4 kg)   SpO2 99%   BMI 24.22 kg/m    Wt Readings from Last 3 Encounters:  07/06/23 164 lb (74.4 kg)  07/14/22 165 lb 12.8 oz (75.2 kg)  04/19/22 162 lb (73.5 kg)    GEN: Well nourished, well developed in no acute  distress NECK: No JVD; No carotid bruits CARDIAC: 3/6 HSM, no murmurs, rubs, gallops RESPIRATORY:  Clear to auscultation without rales, wheezing or rhonchi  ABDOMEN: Soft, non-tender, non-distended EXTREMITIES:  No edema; No deformity  ASSESSMENT AND PLAN: .   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 Murmur noticeable on examination. Echo shows residual SAM of redundant mitral valve leaflets. Discussed potential need for re-operation. -Schedule transesophageal echo on 07/31/2023 to re-evaluate outflow tract obstruction and residual MR. -Start Metoprolol Succinate 25mg  daily to potentially alleviate outflow tract obstruction symptoms. -Consider consultation with mitral valve surgeon for potential re-operation options. -Continue Aspirin and antibiotic prophylaxis for dental work. -Follow-up in 3 months.          Informed Consent   Shared Decision Making/Informed Consent   The risks [esophageal damage, perforation (1:10,000 risk), bleeding, pharyngeal hematoma as well as other potential complications associated with conscious sedation including aspiration, arrhythmia, respiratory failure and death], benefits (treatment guidance and diagnostic support) and alternatives of a transesophageal echocardiogram were discussed in detail with Mr. Grass and he is willing to proceed.       Follow-up: Return in about 3 months (around 10/04/2023).  Time Spent with Patient: I have spent a total of 35 minutes caring for this patient today face to face, ordering and reviewing labs/tests, reviewing prior records/medical history, examining the patient, establishing an assessment and plan, communicating results/findings to the patient/family, and documenting in the medical record.   Signed, Lenna Gilford. Flora Lipps, MD, [facility]  [facility]  [address]  [address] [place], [place] [address] [phone]  11:56 AM

## 2023-07-04 NOTE — Progress Notes (Unsigned)
Cardiology Office Note:  .   Date:  07/06/2023  ID:  Perry Lewis, DOB [birth year], MRN [id number] PCP: Lula Olszewski, MD  [facility] Providers Cardiologist:  Reatha Harps, MD { History of Present Illness: .   Perry Lewis is a 57 y.o. male with history of MV repair who presents for follow-up.   History of Present Illness   [name], a 57 year old individual with a history of mitral valve repair, rheumatoid arthritis, and hyperlipidemia, presents for a follow-up visit. The patient underwent mitral valve repair in May 2022 and has since been maintaining regular physical activity, including gym visits. However, he reports experiencing shortness of breath during exertion, such as when using a treadmill or climbing stairs. This symptom was present before the surgery and continues to affect his daily life. Additionally, the patient reports a sensation of pressure change when tying his shoes or standing up, which he believes is related to the outflow tract obstruction. Despite these symptoms, the patient does not feel that his quality of life is significantly impacted to warrant a redo procedure at this time. The patient also has rheumatoid arthritis, which is managed with methotrexate. He reports soreness in his thumb joints, which his rheumatologist believes might be osteoarthritis.          Problem List 1. Severe mitral regurgitation -s/p valvuloplasty/34 mm annuloplasty ring (12/15/2020 Dr. Cornelius Moras) -CCTA normal (10/07/2020) -EF 50-55% 12/17/2020 -EF 55-60% 05/17/2023 --residual SAM with LVOT obstruction (9 mmHG at rest; 40 mmHG w/valsalva; 2/2 excessive PMVL) -SAM gradients improved 02/03/2021 -> 9 mmHG 2. RA 3. HLD -T chol 216, LDL 141, TG 163, HDL 42 4. Postoperative atrial fibrillation  -12/31/2020 5. RA    ROS: All other ROS reviewed and negative. Pertinent positives noted in the HPI.     Studies Reviewed: Marland Kitchen   EKG Interpretation Date/Time:  Thursday July 06 2023 11:05:15 EST Ventricular Rate:  78 PR Interval:  166 QRS Duration:  84 QT Interval:  366 QTC Calculation: 417 R Axis:   -7  Text Interpretation: Normal sinus rhythm Normal ECG Confirmed by Lennie Odor [phone]) on 07/06/2023 11:55:06 AM    TTE 05/17/2023  1. Left ventricular ejection fraction, by estimation, is 55 to 60%. The  left ventricle has normal function. The left ventricle has no regional  wall motion abnormalities. There is mild concentric left ventricular  hypertrophy. Left ventricular diastolic  function could not be evaluated. The average left ventricular global  longitudinal strain is -18.8 %. The global longitudinal strain is normal.   2. Right ventricular systolic function is normal. The right ventricular  size is moderately enlarged.   3. Left atrial size was moderately dilated.   4. Status post 34 mm medtronic simuform ring. The bioprosthetic mitral  valve appears to be thickened and the posterior cord is mildly tethered  suspect due to thickening of the neochord. The mitral valve has been  repaired/replaced. Mild to moderate  mitral valve regurgitation. No evidence of mitral stenosis. There is a 34  present in the mitral position.   5. Tricuspid valve regurgitation is mild to moderate.   6. The aortic valve is tricuspid. Aortic valve regurgitation is not  visualized. Aortic valve sclerosis/calcification is present, without any  evidence of aortic stenosis.   7. The inferior vena cava is normal in size with greater than 50%  respiratory variability, suggesting right atrial pressure of 3 mmHg.  Physical Exam:   VS:  BP 130/78 (BP Location: Left  Arm, Patient Position: Sitting, Cuff Size: Normal)   Pulse 78   Ht 5\' 9"  (1.753 m)   Wt 164 lb (74.4 kg)   SpO2 99%   BMI 24.22 kg/m    Wt Readings from Last 3 Encounters:  07/06/23 164 lb (74.4 kg)  07/14/22 165 lb 12.8 oz (75.2 kg)  04/19/22 162 lb (73.5 kg)    GEN: Well nourished, well developed in no acute  distress NECK: No JVD; No carotid bruits CARDIAC: 3/6 HSM, no murmurs, rubs, gallops RESPIRATORY:  Clear to auscultation without rales, wheezing or rhonchi  ABDOMEN: Soft, non-tender, non-distended EXTREMITIES:  No edema; No deformity  ASSESSMENT AND PLAN: .   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 Murmur noticeable on examination. Echo shows residual SAM of redundant mitral valve leaflets. Discussed potential need for re-operation. -Schedule transesophageal echo on 07/31/2023 to re-evaluate outflow tract obstruction and residual MR. -Start Metoprolol Succinate 25mg  daily to potentially alleviate outflow tract obstruction symptoms. -Consider consultation with mitral valve surgeon for potential re-operation options. -Continue Aspirin and antibiotic prophylaxis for dental work. -Follow-up in 3 months.          Informed Consent   Shared Decision Making/Informed Consent   The risks [esophageal damage, perforation (1:10,000 risk), bleeding, pharyngeal hematoma as well as other potential complications associated with conscious sedation including aspiration, arrhythmia, respiratory failure and death], benefits (treatment guidance and diagnostic support) and alternatives of a transesophageal echocardiogram were discussed in detail with Perry Lewis and he is willing to proceed.       Follow-up: Return in about 3 months (around 10/04/2023).  Time Spent with Patient: I have spent a total of 35 minutes caring for this patient today face to face, ordering and reviewing labs/tests, reviewing prior records/medical history, examining the patient, establishing an assessment and plan, communicating results/findings to the patient/family, and documenting in the medical record.   Signed, Lenna Gilford. Flora Lipps, MD, [facility]  [facility]  [address],  [address] [place], [place] [address] [phone]  11:56 AM

## 2023-07-06 ENCOUNTER — Ambulatory Visit: Payer: BC Managed Care – PPO | Attending: Cardiovascular Disease | Admitting: Cardiovascular Disease

## 2023-07-06 ENCOUNTER — Encounter: Payer: Self-pay | Admitting: Cardiovascular Disease

## 2023-07-06 VITALS — BP 130/78 | HR 78 | Ht 69.0 in | Wt 164.0 lb

## 2023-07-06 DIAGNOSIS — Q248 Other specified congenital malformations of heart: Secondary | ICD-10-CM

## 2023-07-06 DIAGNOSIS — I340 Nonrheumatic mitral (valve) insufficiency: Secondary | ICD-10-CM

## 2023-07-06 DIAGNOSIS — Z01812 Encounter for preprocedural laboratory examination: Secondary | ICD-10-CM | POA: Diagnosis not present

## 2023-07-06 DIAGNOSIS — Z9889 Other specified postprocedural states: Secondary | ICD-10-CM | POA: Diagnosis not present

## 2023-07-06 DIAGNOSIS — R0602 Shortness of breath: Secondary | ICD-10-CM | POA: Diagnosis not present

## 2023-07-06 MED ORDER — METOPROLOL SUCCINATE ER 25 MG PO TB24: 25.0000 mg | ORAL_TABLET | Freq: Every day | ORAL | 3 refills | Status: DC

## 2023-07-06 NOTE — Patient Instructions (Signed)
Medication Instructions:  - Start METOPROLOL SUCCINATE 25MG , ONCE A DAY    *If you need a refill on your cardiac medications before your next appointment, please call your pharmacy*   Lab Work: BMET CBC   If you have labs (blood work) drawn today and your tests are completely normal, you will receive your results only by: MyChart Message (if you have MyChart) OR A paper copy in the mail If you have any lab test that is abnormal or we need to change your treatment, we will call you to review the results.   Testing/Procedures:     Dear Perry Lewis  You are scheduled for a TEE (Transesophageal Echocardiogram) on Monday, December 30 with Dr. Flora Lipps.  Please arrive at the [facility] (Main Entrance A) at [facility]: [address] [place], [place] [address] at 8:00 AM (This time is 1 hour(s) before your procedure to ensure your preparation).   Free valet parking service is available. You will check in at ADMITTING.   *Please Note: You will receive a call the day before your procedure to confirm the appointment time. That time may have changed from the original time based on the schedule for that day.*    DIET:  Nothing to eat or drink after midnight except a sip of water with medications (see medication instructions below)  MEDICATION INSTRUCTIONS: !!IF ANY NEW MEDICATIONS ARE STARTED AFTER TODAY, PLEASE NOTIFY YOUR PROVIDER AS SOON AS POSSIBLE!!  FYI: Medications such as Semaglutide (Ozempic, [place]), Tirzepatide (Mounjaro, Zepbound), Dulaglutide (Trulicity), etc ("GLP1 agonists") AND Canagliflozin (Invokana), Dapagliflozin (Farxiga), Empagliflozin (Jardiance), Ertugliflozin (Steglatro), Bexagliflozin [employer]) or any combination with one of these drugs such as Invokamet (Canagliflozin/Metformin), Synjardy (Empagliflozin/Metformin), etc ("SGLT2 inhibitors") must be held around the time of a procedure. This is not a comprehensive list of all of these drugs. Please review  all of your medications and talk to your provider if you take any one of these. If you are not sure, ask your provider.        LABS:   Come to the lab at [facility] at [address] between the hours of 8:00 am and 4:30 pm. You do NOT have to be fasting.  FYI:  For your safety, and to allow [place] to monitor your vital signs accurately during the surgery/procedure we request: If you have artificial nails, gel coating, SNS etc, please have those removed prior to your surgery/procedure. Not having the nail coverings /polish removed may result in cancellation or delay of your surgery/procedure.  Your support person will be asked to wait in the waiting room during your procedure.  It is OK to have someone drop you off and come back when you are ready to be discharged.  You cannot drive after the procedure and will need someone to drive you home.  Bring your insurance cards.  *Special Note: Every effort is made to have your procedure done on time. Occasionally there are emergencies that occur at the hospital that may cause delays. Please be patient if a delay does occur.       Follow-Up: At [facility], you and your health needs are our priority.  As part of our continuing mission to provide you with exceptional heart care, we have created designated Provider Care Teams.  These Care Teams include your primary Cardiologist (physician) and Advanced Practice Providers (APPs -  Physician Assistants and Nurse Practitioners) who all work together to provide you with the care you need, when you  need it.  We recommend signing up for the patient portal called "MyChart".  Sign up information is provided on this After Visit Summary.  MyChart is used to connect with patients for Virtual Visits (Telemedicine).  Patients are able to view lab/test results, encounter notes, upcoming appointments, etc.  Non-urgent messages can be sent to your provider as well.   To learn more about what you can  do with MyChart, go to [email or web address].    Your next appointment:   3 month(s)  The format for your next appointment:   In Person  Provider:   Reatha Harps, MD    Other Instructions

## 2023-07-07 LAB — CBC
Hematocrit: 39.9 % (ref 37.5–51.0)
Hemoglobin: 13.3 g/dL (ref 13.0–17.7)
MCH: 32.2 pg (ref 26.6–33.0)
MCHC: 33.3 g/dL (ref 31.5–35.7)
MCV: 97 fL (ref 79–97)
Platelets: 311 10*3/uL (ref 150–450)
RBC: 4.13 x10E6/uL — ABNORMAL LOW (ref 4.14–5.80)
RDW: 13.2 % (ref 11.6–15.4)
WBC: 6.1 10*3/uL (ref 3.4–10.8)

## 2023-07-07 LAB — BASIC METABOLIC PANEL
BUN/Creatinine Ratio: 15 (ref 9–20)
BUN: 12 mg/dL (ref 6–24)
CO2: 23 mmol/L (ref 20–29)
Calcium: 10.1 mg/dL (ref 8.7–10.2)
Chloride: 100 mmol/L (ref 96–106)
Creatinine, Ser: 0.81 mg/dL (ref 0.76–1.27)
Glucose: 91 mg/dL (ref 70–99)
Potassium: 5.3 mmol/L — ABNORMAL HIGH (ref 3.5–5.2)
Sodium: 139 mmol/L (ref 134–144)
eGFR: 102 mL/min/{1.73_m2} (ref >59)

## 2023-07-20 ENCOUNTER — Encounter: Payer: Self-pay | Admitting: Cardiovascular Disease

## 2023-07-28 NOTE — Progress Notes (Signed)
Left detailed message for patient and instructed them to come at 0730  and to be NPO after 0000.  Medications reviewed.    Advised  patient to have a ride home and someone to stay with them for 24 hours after the procedure.

## 2023-07-30 NOTE — Anesthesia Preprocedure Evaluation (Signed)
Anesthesia Evaluation  Patient identified by MRN, date of birth, ID band Patient awake    Reviewed: Allergy & Precautions, NPO status , Patient's Chart, lab work & pertinent test results  History of Anesthesia Complications Negative for: history of anesthetic complications  Airway Mallampati: II  TM Distance: >3 FB Neck ROM: Full   Comment: Previous grade I view with MAC 3, easy mask Dental   Pulmonary neg shortness of breath, neg sleep apnea, neg COPD, neg recent URI, former smoker   Pulmonary exam normal breath sounds clear to auscultation       Cardiovascular (-) hypertension(-) angina (-) Past MI and (-) Cardiac Stents (-) pacemaker+ Valvular Problems/Murmurs (s/p minimally invasive MV repair 11/2020, mild-to-moderate TR) MR and MVP  Rhythm:Regular Rate:Normal  TTE 05/17/2023: IMPRESSIONS     1. Left ventricular ejection fraction, by estimation, is 55 to 60%. The  left ventricle has normal function. The left ventricle has no regional  wall motion abnormalities. There is mild concentric left ventricular  hypertrophy. Left ventricular diastolic  function could not be evaluated. The average left ventricular global  longitudinal strain is -18.8 %. The global longitudinal strain is normal.   2. Right ventricular systolic function is normal. The right ventricular  size is moderately enlarged.   3. Left atrial size was moderately dilated.   4. Status post 34 mm medtronic simuform ring. The bioprosthetic mitral  valve appears to be thickened and the posterior cord is mildly tethered  suspect due to thickening of the neochord. The mitral valve has been  repaired/replaced. Mild to moderate  mitral valve regurgitation. No evidence of mitral stenosis. There is a 34  present in the mitral position.   5. Tricuspid valve regurgitation is mild to moderate.   6. The aortic valve is tricuspid. Aortic valve regurgitation is not  visualized.  Aortic valve sclerosis/calcification is present, without any  evidence of aortic stenosis.   7. The inferior vena cava is normal in size with greater than 50%  respiratory variability, suggesting right atrial pressure of 3 mmHg.     Neuro/Psych negative neurological ROS     GI/Hepatic negative GI ROS, Neg liver ROS,,,  Endo/Other  negative endocrine ROS    Renal/GU negative Renal ROS     Musculoskeletal  (+) Arthritis , Rheumatoid disorders,    Abdominal   Peds  Hematology Lab Results      Component                Value               Date                      WBC                      6.1                 07/06/2023                HGB                      13.3                07/06/2023                HCT                      39.9  07/06/2023                MCV                      97                  07/06/2023                PLT                      311                 07/06/2023              Anesthesia Other Findings K 5.3  Reproductive/Obstetrics                              Anesthesia Physical Anesthesia Plan  ASA: 3  Anesthesia Plan: MAC   Post-op Pain Management:    Induction: Intravenous  PONV Risk Score and Plan: 1 and Ondansetron, Propofol infusion, TIVA and Treatment may vary due to age or medical condition  Airway Management Planned: Natural Airway and Simple Face Mask  Additional Equipment:   Intra-op Plan:   Post-operative Plan:   Informed Consent: I have reviewed the patients History and Physical, chart, labs and discussed the procedure including the risks, benefits and alternatives for the proposed anesthesia with the patient or authorized representative who has indicated his/her understanding and acceptance.     Dental advisory given  Plan Discussed with: CRNA and Anesthesiologist  Anesthesia Plan Comments: (Discussed with patient risks of MAC including, but not limited to, minor pain or discomfort,  hearing people in the room, and possible need for backup general anesthesia. Risks for general anesthesia also discussed including, but not limited to, sore throat, hoarse voice, chipped/damaged teeth, injury to vocal cords, nausea and vomiting, allergic reactions, lung infection, heart attack, stroke, and death. All questions answered. )         Anesthesia Quick Evaluation

## 2023-07-31 ENCOUNTER — Encounter (HOSPITAL_COMMUNITY): Payer: Self-pay | Admitting: Cardiovascular Disease

## 2023-07-31 ENCOUNTER — Ambulatory Visit (HOSPITAL_COMMUNITY): Payer: Self-pay | Admitting: Anesthesiology

## 2023-07-31 ENCOUNTER — Ambulatory Visit (HOSPITAL_COMMUNITY)
Admission: RE | Admit: 2023-07-31 | Discharge: 2023-07-31 | Disposition: A | Discharge status: Home / Self Care | Payer: BC Managed Care – PPO | Source: Ambulatory Visit | Attending: Cardiovascular Disease | Admitting: Cardiovascular Disease

## 2023-07-31 ENCOUNTER — Ambulatory Visit (HOSPITAL_COMMUNITY): Payer: BC Managed Care – PPO

## 2023-07-31 ENCOUNTER — Other Ambulatory Visit: Payer: Self-pay | Admitting: Cardiovascular Disease

## 2023-07-31 ENCOUNTER — Encounter (HOSPITAL_COMMUNITY)
Admission: RE | Disposition: A | Discharge status: Home / Self Care | Payer: BC Managed Care – PPO | Source: Ambulatory Visit | Attending: Cardiovascular Disease

## 2023-07-31 ENCOUNTER — Other Ambulatory Visit: Payer: Self-pay

## 2023-07-31 DIAGNOSIS — M069 Rheumatoid arthritis, unspecified: Secondary | ICD-10-CM | POA: Insufficient documentation

## 2023-07-31 DIAGNOSIS — Z79899 Other long term (current) drug therapy: Secondary | ICD-10-CM | POA: Insufficient documentation

## 2023-07-31 DIAGNOSIS — Z7982 Long term (current) use of aspirin: Secondary | ICD-10-CM | POA: Diagnosis not present

## 2023-07-31 DIAGNOSIS — E785 Hyperlipidemia, unspecified: Secondary | ICD-10-CM | POA: Insufficient documentation

## 2023-07-31 DIAGNOSIS — M1991 Primary osteoarthritis, unspecified site: Secondary | ICD-10-CM | POA: Diagnosis not present

## 2023-07-31 DIAGNOSIS — I340 Nonrheumatic mitral (valve) insufficiency: Secondary | ICD-10-CM

## 2023-07-31 DIAGNOSIS — R0602 Shortness of breath: Secondary | ICD-10-CM

## 2023-07-31 HISTORY — PX: TRANSESOPHAGEAL ECHOCARDIOGRAM (CATH LAB): EP1270

## 2023-07-31 LAB — ECHO TEE

## 2023-07-31 SURGERY — TRANSESOPHAGEAL ECHOCARDIOGRAM (TEE) (CATHLAB)
Anesthesia: Monitor Anesthesia Care

## 2023-07-31 MED ORDER — PROPOFOL 10 MG/ML IV BOLUS
INTRAVENOUS | Status: DC | PRN
  Administered 2023-07-31: 80 mg via INTRAVENOUS

## 2023-07-31 MED ORDER — PROPOFOL 500 MG/50ML IV EMUL
INTRAVENOUS | Status: DC | PRN
  Administered 2023-07-31: 75 ug/kg/min via INTRAVENOUS

## 2023-07-31 MED ORDER — ONDANSETRON HCL 4 MG/2ML IJ SOLN
INTRAMUSCULAR | Status: DC | PRN
  Administered 2023-07-31: 4 mg via INTRAVENOUS

## 2023-07-31 MED ORDER — GLYCOPYRROLATE 0.2 MG/ML IJ SOLN
INTRAMUSCULAR | Status: DC | PRN
  Administered 2023-07-31 (×2): .2 mg via INTRAVENOUS

## 2023-07-31 MED ORDER — SODIUM CHLORIDE 0.9 % IV SOLN: INTRAVENOUS | Status: DC

## 2023-07-31 MED ORDER — LIDOCAINE 2% (20 MG/ML) 5 ML SYRINGE
INTRAMUSCULAR | Status: DC | PRN
  Administered 2023-07-31: 20 mg via INTRAVENOUS

## 2023-07-31 NOTE — CV Procedure (Signed)
    TRANSESOPHAGEAL ECHOCARDIOGRAM   NAME:  Perry Lewis    MRN: [id number] DOB:  1966/03/21    ADMIT DATE: 07/31/2023  INDICATIONS: Mitral regurgitation   PROCEDURE:   Informed consent was obtained prior to the procedure. The risks, benefits and alternatives for the procedure were discussed and the patient comprehended these risks.  Risks include, but are not limited to, cough, sore throat, vomiting, nausea, somnolence, esophageal and stomach trauma or perforation, bleeding, low blood pressure, aspiration, pneumonia, infection, trauma to the teeth and death.    Procedural time out performed. The oropharynx was anesthetized with topical 1% benzocaine.    Anesthesia was administered by Dr. Freida Busman.  The patient's heart rate, blood pressure, and oxygen saturation are monitored continuously during the procedure. The period of conscious sedation is 26 minutes, of which I was present face-to-face 100% of this time.   The transesophageal probe was inserted in the esophagus and stomach without difficulty and multiple views were obtained.   COMPLICATIONS:    There were no immediate complications.  KEY FINDINGS:  Moderate MR with HR increase into the 90s. No LVOT obstruction.  Normal LVEF 60-65%.  Full report to follow. Further management per primary team.   Gerri Spore T. Flora Lipps, MD, [facility]  [facility]  [address] [place], [place] [address] [phone]  9:11 AM

## 2023-07-31 NOTE — Transfer of Care (Signed)
Immediate Anesthesia Transfer of Care Note  Patient: Perry Lewis  Procedure(s) Performed: TRANSESOPHAGEAL ECHOCARDIOGRAM  Patient Location: PACU  Anesthesia Type:MAC  Level of Consciousness: awake, alert , and oriented  Airway & Oxygen Therapy: Patient Spontanous Breathing  Post-op Assessment: Report given to RN  Post vital signs: Reviewed and stable  Last Vitals:  Vitals Value Taken Time  BP 127/87 07/31/23 0912  Temp 36.4 C 07/31/23 0912  Pulse 82 07/31/23 0914  Resp 15 07/31/23 0914  SpO2 99 % 07/31/23 0914  Vitals shown include unfiled device data.  Last Pain:  Vitals:   07/31/23 0912  TempSrc: Tympanic  PainSc: 0-No pain         Complications: No notable events documented.

## 2023-07-31 NOTE — Discharge Instructions (Signed)
TEE  YOU HAD AN CARDIAC PROCEDURE TODAY: Refer to the procedure report and other information in the discharge instructions given to you for any specific questions about what was found during the examination. If this information does not answer your questions, please call [facility] office at [phone] to clarify.   DIET: Your first meal following the procedure should be a light meal and then it is ok to progress to your normal diet. A half-sandwich or bowl of soup is an example of a good first meal. Heavy or fried foods are harder to digest and may make you feel nauseous or bloated. Drink plenty of fluids but you should avoid alcoholic beverages for 24 hours. If you had a esophageal dilation, please see attached instructions for diet.   ACTIVITY: Your care partner should take you home directly after the procedure. You should plan to take it easy, moving slowly for the rest of the day. You can resume normal activity the day after the procedure however YOU SHOULD NOT DRIVE, use power tools, machinery or perform tasks that involve climbing or major physical exertion for 24 hours (because of the sedation medicines used during the test).   SYMPTOMS TO REPORT IMMEDIATELY: A cardiologist can be reached at any hour. Please call [phone] for any of the following symptoms:  Vomiting of blood or coffee ground material  New, significant abdominal pain  New, significant chest pain or pain under the shoulder blades  Painful or persistently difficult swallowing  New shortness of breath  Black, tarry-looking or red, bloody stools  FOLLOW UP:  Please also call with any specific questions about appointments or follow up tests.   

## 2023-07-31 NOTE — Interval H&P Note (Signed)
History and Physical Interval Note:  07/31/2023 8:25 AM  Perry Lewis  has presented today for surgery, with the diagnosis of MITRAL VALVE INSUFFINCENCY.  The various methods of treatment have been discussed with the patient and family. After consideration of risks, benefits and other options for treatment, the patient has consented to  Procedure(s): TRANSESOPHAGEAL ECHOCARDIOGRAM (N/A) as a surgical intervention.  The patient's history has been reviewed, patient examined, no change in status, stable for surgery.  I have reviewed the patient's chart and labs.  Questions were answered to the patient's satisfaction.    NPO for TEE. For MR.   Gerri Spore T. Flora Lipps, MD, [facility]  [facility]  [address] [place], [place] [address] [phone]  8:25 AM

## 2023-07-31 NOTE — Anesthesia Postprocedure Evaluation (Signed)
Anesthesia Post Note  Patient: Perry Lewis  Procedure(s) Performed: TRANSESOPHAGEAL ECHOCARDIOGRAM     Patient location during evaluation: PACU Anesthesia Type: MAC Level of consciousness: awake Pain management: pain level controlled Vital Signs Assessment: post-procedure vital signs reviewed and stable Respiratory status: spontaneous breathing, nonlabored ventilation and respiratory function stable Cardiovascular status: stable and blood pressure returned to baseline Postop Assessment: no apparent nausea or vomiting Anesthetic complications: no   No notable events documented.  Last Vitals:  Vitals:   07/31/23 0922 07/31/23 0932  BP: 129/84 133/87  Pulse: 80 78  Resp: 16 15  Temp:    SpO2: 96% 100%    Last Pain:  Vitals:   07/31/23 0932  TempSrc:   PainSc: 0-No pain                 Linton Rump

## 2023-08-01 ENCOUNTER — Encounter (HOSPITAL_COMMUNITY): Payer: Self-pay | Admitting: Cardiovascular Disease

## 2023-08-16 MED ORDER — METOPROLOL SUCCINATE ER 50 MG PO TB24: 50.0000 mg | ORAL_TABLET | Freq: Every day | ORAL | 1 refills | Status: DC

## 2023-08-16 NOTE — Telephone Encounter (Signed)
 Harrold Lincoln, MD to Me  Zeb Heys, RN     08/16/23  1:28 PM Increase to 50 mg daily. Make sure he is drinking plenty of water.  Melodee Spruce T. Rolm Clos, MD, [facility]  [facility] [address], [address] [place], [place] [address] [phone] 1:28 PM

## 2023-09-05 ENCOUNTER — Encounter (HOSPITAL_COMMUNITY): Payer: Self-pay

## 2023-09-12 ENCOUNTER — Ambulatory Visit (HOSPITAL_COMMUNITY): Payer: BC Managed Care – PPO

## 2023-09-12 ENCOUNTER — Ambulatory Visit (HOSPITAL_COMMUNITY): Payer: BC Managed Care – PPO | Attending: Internal Medicine

## 2023-09-12 DIAGNOSIS — R0602 Shortness of breath: Secondary | ICD-10-CM | POA: Diagnosis not present

## 2023-09-12 LAB — ECHOCARDIOGRAM STRESS TEST
Area-P 1/2: 2.62 cm2
MV VTI: 3.17 cm2
S' Lateral: 2.34 cm

## 2023-09-14 ENCOUNTER — Encounter: Payer: Self-pay | Admitting: Cardiovascular Disease

## 2023-10-01 NOTE — Progress Notes (Unsigned)
 Cardiology Office Note:  .   Date:  10/04/2023  ID:  Perry Lewis, DOB 05/31/1966, MRN [id number] PCP: Lula Olszewski, MD   HeartCare Providers Cardiologist:  Reatha Harps, MD { History of Present Illness: .    Chief Complaint  Patient presents with   Follow-up         Perry Lewis is a 58 y.o. male with history of MV repair who presents for follow-up.    History of Present Illness   Perry Lewis is a 58 year old male with mitral valve repair who presents for follow-up.  He has a history of mitral valve repair and is experiencing worsening symptoms, including shortness of breath with minimal exertion, such as climbing a flight of stairs. Initially, there was left ventricular outflow tract (LVOT) obstruction, which has improved, but the mitral valve regurgitation has worsened based on recent TEE and stress echo.  Recent exercise echo stress testing showed severe mitral regurgitation (MR) that worsens with activity. The transesophageal echocardiogram (TEE) and stress echo confirmed the severity of the MR with activity. No LVOT obstruction.   He has been on metoprolol, but did not notice any improvement in symptoms with this medication. He is currently taking aspirin as part of his medication regimen.  A coronary CT angiogram performed in 2022 showed no blockages.          Problem List 1. Severe mitral regurgitation -s/p valvuloplasty/34 mm annuloplasty ring (12/15/2020 Dr. Cornelius Moras) -CCTA normal (10/07/2020) -EF 50-55% 12/17/2020 -EF 55-60% 05/17/2023 --residual SAM with LVOT obstruction (9 mmHG at rest; 40 mmHG w/valsalva; 2/2 excessive PMVL) -SAM gradients improved 02/03/2021 -> 9 mmHG 2. RA 3. HLD -T chol 216, LDL 141, TG 163, HDL 42 4. Postoperative atrial fibrillation  -12/31/2020 5. RA    ROS: All other ROS reviewed and negative. Pertinent positives noted in the HPI.     Studies Reviewed: .        Echo Stress 09/12/2023  1. Increase in MR  post-exercise; increases from mild to moderate-severe.   2. MV mean gradient 4 mmHg at rest 79bpm and 6 mmHg HR 86bpm  post-exercise.   3. No significant LVOT gradient at rest and post-exercise.   4. TR is mild at rest with no significant change with post-exercise.   5. This is a negative stress echocardiogram for ischemia.   6. This is a low risk study.  Physical Exam:   VS:  BP 128/82 (BP Location: Left Arm, Patient Position: Sitting, Cuff Size: Normal)   Pulse 80   Ht 5\' 9"  (1.753 m)   Wt 167 lb (75.8 kg)   SpO2 95%   BMI 24.66 kg/m    Wt Readings from Last 3 Encounters:  10/04/23 167 lb (75.8 kg)  07/31/23 165 lb (74.8 kg)  07/06/23 164 lb (74.4 kg)    GEN: Well nourished, well developed in no acute distress NECK: No JVD; No carotid bruits CARDIAC: RRR, 3/6 HSM, rubs, gallops RESPIRATORY:  Clear to auscultation without rales, wheezing or rhonchi  ABDOMEN: Soft, non-tender, non-distended EXTREMITIES:  No edema; No deformity  ASSESSMENT AND PLAN: .   Assessment and Plan    Severe mitral valve regurgitation Failed MV repair Severe MR worsens with activity, causing dyspnea. Mitral valve repair is incompetent, requiring redo surgery. Risks include heart failure, arrhythmias, and pulmonary hypertension. Metoprolol was ineffective. Advised to remain active but expect dyspnea.  - Refer to Dr. Zebedee Iba at [facility] for re-do mitral valve surgery consultation. -  Discontinue metoprolol. - Continue aspirin. - Send transesophageal echo and stress echo images to [facility]. -CCTA 2022 normal. May not need to repeat this before surgery.  - Schedule a six-month follow-up appointment.              Follow-up: Return in about 6 months (around 04/05/2024).  Time Spent with Patient: I have spent a total of 32 minutes caring for this patient today face to face, ordering and reviewing labs/tests, reviewing prior records/medical history, examining the patient, establishing an assessment and plan,  communicating results/findings to the patient/family, and documenting in the medical record.   Signed, Lenna Gilford. Flora Lipps, MD, [facility]  [facility]  [address] [place], [place] [address] [phone]  7:32 PM

## 2023-10-04 ENCOUNTER — Ambulatory Visit: Payer: BC Managed Care – PPO | Attending: Cardiovascular Disease | Admitting: Cardiovascular Disease

## 2023-10-04 ENCOUNTER — Encounter: Payer: Self-pay | Admitting: Cardiovascular Disease

## 2023-10-04 VITALS — BP 128/82 | HR 80 | Ht 69.0 in | Wt 167.0 lb

## 2023-10-04 DIAGNOSIS — R0602 Shortness of breath: Secondary | ICD-10-CM | POA: Diagnosis not present

## 2023-10-04 DIAGNOSIS — Z9889 Other specified postprocedural states: Secondary | ICD-10-CM

## 2023-10-04 DIAGNOSIS — I340 Nonrheumatic mitral (valve) insufficiency: Secondary | ICD-10-CM | POA: Diagnosis not present

## 2023-10-04 NOTE — Patient Instructions (Signed)
 Medication Instructions:  Your physician recommends that you continue on your current medications as directed. Please refer to the Current Medication list given to you today.    *If you need a refill on your cardiac medications before your next appointment, please call your pharmacy*   Lab Work: None    If you have labs (blood work) drawn today and your tests are completely normal, you will receive your results only by: MyChart Message (if you have MyChart) OR A paper copy in the mail If you have any lab test that is abnormal or we need to change your treatment, we will call you to review the results.   Testing/Procedures: None    Follow-Up: At [facility], you and your health needs are our priority.  As part of our continuing mission to provide you with exceptional heart care, we have created designated Provider Care Teams.  These Care Teams include your primary Cardiologist (physician) and Advanced Practice Providers (APPs -  Physician Assistants and Nurse Practitioners) who all work together to provide you with the care you need, when you need it.  We recommend signing up for the patient portal called "MyChart".  Sign up information is provided on this After Visit Summary.  MyChart is used to connect with patients for Virtual Visits (Telemedicine).  Patients are able to view lab/test results, encounter notes, upcoming appointments, etc.  Non-urgent messages can be sent to your provider as well.   To learn more about what you can do with MyChart, go to [email or web address].    Your next appointment:   6 month(s)  The format for your next appointment:   In Person  Provider:   Reatha Harps, MD    Other Instructions Dr. Flora Lipps has referred you to Dr. Jomarie Longs at [facility]

## 2023-11-15 DIAGNOSIS — Z9889 Other specified postprocedural states: Secondary | ICD-10-CM | POA: Diagnosis not present

## 2023-11-15 DIAGNOSIS — Z87891 Personal history of nicotine dependence: Secondary | ICD-10-CM | POA: Diagnosis not present

## 2023-11-15 DIAGNOSIS — M069 Rheumatoid arthritis, unspecified: Secondary | ICD-10-CM | POA: Diagnosis not present

## 2023-11-15 DIAGNOSIS — I340 Nonrheumatic mitral (valve) insufficiency: Secondary | ICD-10-CM | POA: Diagnosis not present

## 2023-11-30 HISTORY — PX: MITRAL VALVE REPLACEMENT: SHX147

## 2023-12-01 ENCOUNTER — Encounter: Payer: Self-pay | Admitting: Internal Medicine

## 2023-12-01 ENCOUNTER — Ambulatory Visit (INDEPENDENT_AMBULATORY_CARE_PROVIDER_SITE_OTHER): Admitting: Internal Medicine

## 2023-12-01 VITALS — BP 112/80 | HR 79 | Temp 98.0°F | Ht 69.0 in | Wt 161.4 lb

## 2023-12-01 DIAGNOSIS — Z1211 Encounter for screening for malignant neoplasm of colon: Secondary | ICD-10-CM

## 2023-12-01 DIAGNOSIS — T451X5S Adverse effect of antineoplastic and immunosuppressive drugs, sequela: Secondary | ICD-10-CM

## 2023-12-01 DIAGNOSIS — I340 Nonrheumatic mitral (valve) insufficiency: Secondary | ICD-10-CM | POA: Diagnosis not present

## 2023-12-01 NOTE — Progress Notes (Unsigned)
 ==============================  [facility]: [phone]   -- Medical Office Visit --  Patient: Perry Lewis      Age: 58 y.o.       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)   Mitral regurgitation   Mitral valve insufficiency   Mitral valve prolapse   Rheumatoid arthritis (HCC)   Rheumatoid arthritis involving multiple sites with positive rheumatoid factor (HCC)   Seasonal allergies   Varicose veins of right leg with edema   Varicose veins of right lower extremity  :1}}  Updated Problem List Entries: No problems updated.  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 Medications:  has a current medication list which includes the following prescription(s): aspirin  ec, folic acid , methotrexate, amoxicillin , metoprolol  succinate, and multivitamin with minerals.  Allergies:  has no known allergies.  Past Medical History:  has a past medical history of Heart  murmur (2015), Mitral regurgitation, Mitral valve prolapse, Rheumatoid arthritis (HCC), and S/P minimally invasive mitral valve repair (12/15/2020). Past Surgical History:   has a past surgical history that includes Hernia repair; laparoscopic appendectomy (N/A, 11/27/2018); TEE without cardioversion (N/A, 10/26/2020); Bubble study (10/26/2020); Mitral valve repair (Right, 12/15/2020); TEE without cardioversion (N/A, 12/15/2020); Appendectomy (2020); Eye surgery (1995); and TRANSESOPHAGEAL ECHOCARDIOGRAM (N/A, 07/31/2023). Social History:   reports that he quit smoking about 35 years ago. His smoking use included cigarettes. He started smoking about 42 years ago. He has a 7 pack-year smoking history. He has never used smokeless tobacco. He reports current alcohol use of about 6.0 standard drinks of alcohol per week. He reports that he does not currently use drugs after having used the following drugs: Marijuana. Family History:  family history includes Alcohol abuse in his maternal grandfather and maternal grandmother; Depression in his mother; Early death in his mother; Varicose Veins in his mother. Depression Screen and Health Maintenance:    12/01/2023    4:01 PM 07/14/2022    3:30 PM 05/07/2021    7:16 AM 03/04/2021    9:25 AM  PHQ 2/9 Scores  PHQ - 2 Score 0 0 0 0  PHQ- 9 Score 0       Medication Reconciliation: Current Outpatient Medications on File Prior to Visit  Medication Sig  . aspirin  EC 81 MG EC tablet Take 1 tablet (81 mg total) by mouth daily. Swallow whole.  . folic acid  (FOLVITE ) 1 MG tablet Take 2 mg by mouth daily.  . methotrexate (RHEUMATREX) 2.5 MG tablet Take 15 mg by mouth every Tuesday. Caution:Chemotherapy. Protect from light.  . amoxicillin  (AMOXIL ) 500 MG tablet Take 4 tablets 30-60 minutes before dental procedures. (Patient  not taking: Reported on 10/04/2023)  . metoprolol  succinate (TOPROL  XL) 50 MG 24 hr tablet Take 1 tablet (50 mg total) by mouth daily.  . Multiple  Vitamins-Minerals (MULTIVITAMIN WITH MINERALS) tablet Take 1 tablet by mouth daily.   No current facility-administered medications on file prior to visit.  There are no discontinued medications.   Physical Exam:    12/01/2023    3:59 PM 10/04/2023    3:19 PM 07/31/2023    9:32 AM  Vitals with BMI  Height 5\' 9"  5\' 9"    Weight 161 lbs 6 oz 167 lbs   BMI 23.82 24.65   Systolic 112 128 161  Diastolic 80 82 87  Pulse 79 80 78  Vital signs reviewed.  Nursing notes reviewed. Weight trend reviewed. Physical Exam General Appearance:  No acute distress appreciable.   Well-groomed, healthy-appearing male.  Well proportioned with no abnormal fat distribution.  Good muscle tone. Pulmonary:  Normal work of breathing at rest, no respiratory distress apparent. SpO2: 98 %  Musculoskeletal: All extremities are intact.  Neurological:  Awake, alert, oriented, and engaged.  No obvious focal neurological deficits or cognitive impairments.  Sensorium seems unclouded.   Speech is clear and coherent with logical content. Psychiatric:  Appropriate mood, pleasant and cooperative demeanor, thoughtful and engaged during the exam Physical Exam  ***   {Insert previous labs (optional):23779} {See past labs  Heme  Chem  Endocrine  Serology  Results Review (optional):1} No results found for any visits on 12/01/23. Appointment on 09/12/2023  Component Date Value  . Area-P 1/2 09/12/2023 2.62   . S' Lateral 09/12/2023 2.34   . MV VTI 09/12/2023 3.17   Admission on 07/31/2023, Discharged on 07/31/2023  Component Date Value  . Est EF 07/31/2023 60 - 65%   Office Visit on 07/06/2023  Component Date Value  . Glucose 07/06/2023 91   . BUN 07/06/2023 12   . Creatinine, Ser 07/06/2023 0.81   . eGFR 07/06/2023 102   . BUN/Creatinine Ratio 07/06/2023 15   . Sodium 07/06/2023 139   . Potassium 07/06/2023 5.3 (H)   . Chloride 07/06/2023 100   . CO2 07/06/2023 23   . Calcium 07/06/2023 10.1   . WBC 07/06/2023  6.1   . RBC 07/06/2023 4.13 (L)   . Hemoglobin 07/06/2023 13.3   . Hematocrit 07/06/2023 39.9   . MCV 07/06/2023 97   . [facility] 07/06/2023 32.2   . MCHC 07/06/2023 33.3   . RDW 07/06/2023 13.2   . Platelets 07/06/2023 311   Appointment on 05/17/2023  Component Date Value  . Est EF 05/17/2023 55 - 60%   No image results found. ECHOCARDIOGRAM STRESS TEST Result Date: 09/12/2023     EXERCISE STRESS ECHO REPORT    -------------------------------------------------------------------------------- Patient Name:   AMZIE JARDINE Date of Exam: 09/12/2023 Medical Rec #:  [id number]          Height:       69.0 in Accession #:    [id number]         Weight:       165.0 lb Date of Birth:  Aug 23, 1965         BSA:          1.904 m Patient Age:    57 years           BP:           132/85 mmHg Patient Gender: M  HR:           88 bpm. Exam Location:  [address] Procedure: 2D Echo, Stress Echo, Limited Color Doppler and Color Doppler Indications:     Evaluation of MR, TR and LVOT                  Dyspnea R06.02  History:         Patient has prior history of Echocardiogram examinations, most                  recent 05/17/2023. Mitral Valve: 34 medtronic sinuform ring                  prosthetic annuloplasty ring valve is present in the mitral                  position. Procedure Date: 12/15/2020. Previous echo revealed                  LVEF 60% bioprosthetic MV with mild to moderate MR.  Sonographer:     Donnita Gales [facility], RDCS Referring Phys:  [id number] Cathay Clonts O'NEAL Diagnosing Phys: [place] IMPRESSIONS  1. Increase in MR post-exercise; increases from mild to moderate-severe.  2. MV mean gradient 4 mmHg at rest 79bpm and 6 mmHg HR 86bpm post-exercise.  3. No significant LVOT gradient at rest and post-exercise.  4. TR is mild at rest with no significant change with post-exercise.  5. This is a negative stress echocardiogram for ischemia.  6. This is a low risk study. FINDINGS Exam Protocol: The  patient exercised on a treadmill according to a Bruce protocol.  Patient Performance: The patient exercised for 10 minutes and 17 seconds, achieving 12.2 METS. The maximum stage achieved was IV of the Bruce protocol. The baseline heart rate was 88 bpm. The heart rate at peak stress was 157 bpm. The target heart rate was calculated to be 137 bpm. The percentage of maximum predicted heart rate achieved was 97.1 %. The baseline blood pressure was 142/87 mmHg. The blood pressure at peak stress was 172/71 mmHg. The patient developed fatigue during the stress exam.  EKG: The patient developed no abnormal EKG findings during exercise.  2D Echo Findings: Baseline regional wall motion abnormalities were not present. There were no stress-induced wall motion abnormalities. This is a negative stress echocardiogram for ischemia.  Additional Findings: Increase in MR post-exercise; increases from mild to moderate-severe. MV mean gradient 4 mmHg at rest 79bpm and 6 mmHg HR 86bpm post-exercise. TR is mild at rest with no significant change with post-exercise. No significant LVOT gradient at rest and post-exercise.  Alois Arnt Electronically signed on 09/12/2023 at 5:20:17 PM    Final (Updated)       Results     Assessment & Plan   Assessment and Plan Assessment & Plan          Orders Placed During this Encounter:  No orders of the defined types were placed in this encounter.  No orders of the defined types were placed in this encounter.  ED Discharge Orders     None         This document was synthesized by artificial intelligence (Abridge) using HIPAA-compliant recording of the clinical interaction;   We discussed the use of AI scribe software for clinical note transcription with the patient, who gave verbal consent to proceed. additional Info: This encounter employed state-of-the-art, real-time, collaborative documentation. The patient actively reviewed and assisted in updating their  electronic medical  record on a shared screen, ensuring transparency and facilitating joint problem-solving for the problem list, overview, and plan. This approach promotes accurate, informed care. The treatment plan was discussed and reviewed in detail, including medication safety, potential side effects, and all patient questions. We confirmed understanding and comfort with the plan. Follow-up instructions were established, including contacting the office for any concerns, returning if symptoms worsen, persist, or new symptoms develop, and precautions for potential emergency department visits.

## 2023-12-03 NOTE — Assessment & Plan Note (Signed)
 He experiences severe mitral regurgitation with exercise-induced symptoms. A previous mitral valve repair three years ago was unsuccessful, and he is scheduled for a mechanical valve replacement at the end of May. Methotrexate use may have contributed to the failure of the previous repair. The cardiologist and surgeon will determine if a colonoscopy can be safely performed before the valve replacement. He can perform moderate exercise, suggesting potential tolerance for the procedure, but the risk of severe regurgitation may necessitate postponing it until after valve surgery. Consult with the heart surgeon regarding the safety of performing a colonoscopy before valve replacement. Discuss with the cardiologist and surgeon about the timing of the colonoscopy in relation to valve surgery. Consider a FIT test as a non-invasive interim screening option if the colonoscopy is delayed.

## 2023-12-27 DIAGNOSIS — Z452 Encounter for adjustment and management of vascular access device: Secondary | ICD-10-CM | POA: Diagnosis not present

## 2023-12-27 DIAGNOSIS — Z01810 Encounter for preprocedural cardiovascular examination: Secondary | ICD-10-CM | POA: Diagnosis not present

## 2023-12-27 DIAGNOSIS — D696 Thrombocytopenia, unspecified: Secondary | ICD-10-CM | POA: Diagnosis not present

## 2023-12-27 DIAGNOSIS — I480 Paroxysmal atrial fibrillation: Secondary | ICD-10-CM | POA: Diagnosis not present

## 2023-12-27 DIAGNOSIS — E872 Acidosis, unspecified: Secondary | ICD-10-CM | POA: Diagnosis not present

## 2023-12-27 DIAGNOSIS — Z7982 Long term (current) use of aspirin: Secondary | ICD-10-CM | POA: Diagnosis not present

## 2023-12-27 DIAGNOSIS — J939 Pneumothorax, unspecified: Secondary | ICD-10-CM | POA: Diagnosis not present

## 2023-12-27 DIAGNOSIS — D689 Coagulation defect, unspecified: Secondary | ICD-10-CM | POA: Diagnosis not present

## 2023-12-27 DIAGNOSIS — Z79899 Other long term (current) drug therapy: Secondary | ICD-10-CM | POA: Diagnosis not present

## 2023-12-27 DIAGNOSIS — Z87891 Personal history of nicotine dependence: Secondary | ICD-10-CM | POA: Diagnosis not present

## 2023-12-27 DIAGNOSIS — Z4682 Encounter for fitting and adjustment of non-vascular catheter: Secondary | ICD-10-CM | POA: Diagnosis not present

## 2023-12-27 DIAGNOSIS — J90 Pleural effusion, not elsewhere classified: Secondary | ICD-10-CM | POA: Diagnosis not present

## 2023-12-27 DIAGNOSIS — Z952 Presence of prosthetic heart valve: Secondary | ICD-10-CM | POA: Diagnosis not present

## 2023-12-27 DIAGNOSIS — E871 Hypo-osmolality and hyponatremia: Secondary | ICD-10-CM | POA: Diagnosis not present

## 2023-12-27 DIAGNOSIS — E785 Hyperlipidemia, unspecified: Secondary | ICD-10-CM | POA: Diagnosis not present

## 2023-12-27 DIAGNOSIS — D62 Acute posthemorrhagic anemia: Secondary | ICD-10-CM | POA: Diagnosis not present

## 2023-12-27 DIAGNOSIS — I340 Nonrheumatic mitral (valve) insufficiency: Secondary | ICD-10-CM | POA: Diagnosis not present

## 2023-12-27 DIAGNOSIS — M0579 Rheumatoid arthritis with rheumatoid factor of multiple sites without organ or systems involvement: Secondary | ICD-10-CM | POA: Diagnosis not present

## 2023-12-27 DIAGNOSIS — Z9049 Acquired absence of other specified parts of digestive tract: Secondary | ICD-10-CM | POA: Diagnosis not present

## 2023-12-27 DIAGNOSIS — E781 Pure hyperglyceridemia: Secondary | ICD-10-CM | POA: Diagnosis not present

## 2023-12-27 DIAGNOSIS — J9811 Atelectasis: Secondary | ICD-10-CM | POA: Diagnosis not present

## 2023-12-27 DIAGNOSIS — Z79631 Long term (current) use of antimetabolite agent: Secondary | ICD-10-CM | POA: Diagnosis not present

## 2023-12-27 DIAGNOSIS — R0602 Shortness of breath: Secondary | ICD-10-CM | POA: Diagnosis not present

## 2023-12-27 DIAGNOSIS — Z9889 Other specified postprocedural states: Secondary | ICD-10-CM | POA: Diagnosis not present

## 2023-12-28 DIAGNOSIS — Z4682 Encounter for fitting and adjustment of non-vascular catheter: Secondary | ICD-10-CM | POA: Diagnosis not present

## 2023-12-28 DIAGNOSIS — J939 Pneumothorax, unspecified: Secondary | ICD-10-CM | POA: Diagnosis not present

## 2023-12-28 DIAGNOSIS — I340 Nonrheumatic mitral (valve) insufficiency: Secondary | ICD-10-CM | POA: Diagnosis not present

## 2023-12-28 DIAGNOSIS — Z452 Encounter for adjustment and management of vascular access device: Secondary | ICD-10-CM | POA: Diagnosis not present

## 2023-12-28 DIAGNOSIS — Z9889 Other specified postprocedural states: Secondary | ICD-10-CM | POA: Diagnosis not present

## 2023-12-29 DIAGNOSIS — I340 Nonrheumatic mitral (valve) insufficiency: Secondary | ICD-10-CM | POA: Diagnosis not present

## 2023-12-29 DIAGNOSIS — J939 Pneumothorax, unspecified: Secondary | ICD-10-CM | POA: Diagnosis not present

## 2023-12-29 DIAGNOSIS — Z4682 Encounter for fitting and adjustment of non-vascular catheter: Secondary | ICD-10-CM | POA: Diagnosis not present

## 2023-12-29 DIAGNOSIS — Z452 Encounter for adjustment and management of vascular access device: Secondary | ICD-10-CM | POA: Diagnosis not present

## 2023-12-30 DIAGNOSIS — I340 Nonrheumatic mitral (valve) insufficiency: Secondary | ICD-10-CM | POA: Diagnosis not present

## 2023-12-30 DIAGNOSIS — J90 Pleural effusion, not elsewhere classified: Secondary | ICD-10-CM | POA: Diagnosis not present

## 2023-12-30 DIAGNOSIS — Z4682 Encounter for fitting and adjustment of non-vascular catheter: Secondary | ICD-10-CM | POA: Diagnosis not present

## 2023-12-30 DIAGNOSIS — Z452 Encounter for adjustment and management of vascular access device: Secondary | ICD-10-CM | POA: Diagnosis not present

## 2024-01-01 ENCOUNTER — Telehealth: Payer: Self-pay | Admitting: Internal Medicine

## 2024-01-01 NOTE — Telephone Encounter (Signed)
 Copied from CRM [id number]. Topic: Appointments - Scheduling Inquiry for Clinic >> Jan 01, 2024 10:35 AM Chuck Crater wrote: Reason for CRM: Kaylie states that she wants an order and to schedule patient for INR and to follow it to make sure its within their goal for patient and is requesting a callback. She states patient is being discharged tomorrow and wants to schedule INR within 48-72 hours.

## 2024-01-01 NOTE — Telephone Encounter (Signed)
 Left HIPAA compliant VM for pt to return call.

## 2024-01-02 NOTE — Telephone Encounter (Signed)
 Perry Lewis with [facility] called regarding INR. She is needing a call back asap. Phone # [phone].

## 2024-01-02 NOTE — Telephone Encounter (Signed)
 Tried to contact Kaylie but no answer and no VM.

## 2024-01-02 NOTE — Telephone Encounter (Signed)
 Contacted Keli, RN, at [facility] who requested an apt for pt's warfarin management. Scheduled pt for Friday, 6/6 at 2 pm at [facility]. She will advise pt of direct number to coumadin  clinic and also address for apt.  She reports pt started warfarin on 5/30 and has recently been taking 5 mg daily. Last INR yesterday was 1.8.   Pt will be d/c tomorrow. She also advised pt this nurse has been trying to contact him and the number to return call.    Contacted pt and advised of coumadin  clinic apt on Friday. Pt reports he has already had some warfarin education from pharmacy while in the hospital. Answered all questions and again advised of apt on Friday as well as address and direct number to coumadin  clinic and if any changes to contact the coumadin  clinic. Pt verbalized understanding.

## 2024-01-02 NOTE — Telephone Encounter (Signed)
 Tried to call keli with [facility] no answer left voicemail to call office back.

## 2024-01-05 ENCOUNTER — Telehealth: Payer: Self-pay

## 2024-01-05 ENCOUNTER — Ambulatory Visit (INDEPENDENT_AMBULATORY_CARE_PROVIDER_SITE_OTHER)

## 2024-01-05 DIAGNOSIS — Z7901 Long term (current) use of anticoagulants: Secondary | ICD-10-CM | POA: Diagnosis not present

## 2024-01-05 LAB — POCT INR: INR: 2.8 (ref 2.0–3.0)

## 2024-01-05 NOTE — Telephone Encounter (Signed)
 Pt in coumadin  clinic today for first outpt INR check. INR today was 2.8 and within his range of 2.5-3.5  Advised pt he should have a hospital f/u with PCP within 2 weeks. He requested an apt with PCP at the same time as his next INR check on 6/11. Currently unsure if provider has an opening. Advised a msg would be sent to PCP with the request.   Pt is currently scheduled for coumadin  clinic on 6/11 at 11 am.

## 2024-01-05 NOTE — Progress Notes (Signed)
 Pt is a new mechanical valve pt and was placed on warfarin approximately 4-5 days ago.  Pt was d/c with a warfarin script for 2 mg tablets and prescribed to take 2 1/2 daily. Pt has been following this script. Pt reports he was not prescribed Lovenox . A full discussion of the nature of anticoagulants has been carried out.  A benefit risk analysis has been presented to the patient, so that they understand the justification for choosing anticoagulation at this time. The need for frequent and regular monitoring, precise dosage adjustment and compliance is stressed.  Side effects of potential bleeding are discussed.  The patient should avoid any OTC items containing aspirin  or ibuprofen , and should avoid great swings in general diet.  Avoid alcohol consumption.   Pt was provided with education material, a booklet from the [employer] and educational websites. Pt was provided with direct number to coumadin  clinic. All questions answered to pts satisfaction.  Advised pt he should have a hospital f/u with PCP within 2 weeks. He requested an apt with PCP at the same time as his next INR check on 6/11. Currently unsure if provider has an opening. Advised a msg would be sent to PCP with the request.  Sent msg to PCP. Continue 2 1/2  tablets (5 mg) daily. Recheck on 6/11 at [facility].

## 2024-01-05 NOTE — Patient Instructions (Addendum)
 Pre visit review using our clinic review tool, if applicable. No additional management support is needed unless otherwise documented below in the visit note.  Continue 2 1/2  tablets (5 mg) daily. Recheck on 6/11 at [facility].

## 2024-01-08 ENCOUNTER — Telehealth (HOSPITAL_COMMUNITY): Payer: Self-pay

## 2024-01-08 NOTE — Telephone Encounter (Signed)
 Spoke with nurse navigator, we confirmed patient has f/u appt with cardiac surgeon but not a cardiologist. Called patient, confirmed his cardiologist is Dr. Rolm Clos and informed him he needs to have a f/u appt with Dr. Rolm Clos before we can schedule him for CR. Patient acknowledged, stated he will call and set up an appt as soon as possible.

## 2024-01-08 NOTE — Telephone Encounter (Signed)
 Pt LVM he needs to RS his coumadin  clinic apt scheduled for 6/11 at [facility] to 6/13 at [facility] due to transportation. Have opening in coumadin  clinic at 8, 8:30 or 9.  Advised pt of opening. Requested 9 am. Placed pt on schedule and advised to continue 2 1/2 tablets daily and if any changes to contact coumadin  clinic. Pt verbalized understanding.

## 2024-01-09 ENCOUNTER — Encounter: Payer: Self-pay | Admitting: Cardiovascular Disease

## 2024-01-10 ENCOUNTER — Telehealth (HOSPITAL_COMMUNITY): Payer: Self-pay

## 2024-01-10 ENCOUNTER — Ambulatory Visit

## 2024-01-10 NOTE — Telephone Encounter (Signed)
 Patient left message on 6/10 stating he called to schedule f/u appt with cardiologist and was informed there are no appts until September. Stated he is on the waitlist to try and get in sooner.

## 2024-01-12 ENCOUNTER — Ambulatory Visit (INDEPENDENT_AMBULATORY_CARE_PROVIDER_SITE_OTHER)

## 2024-01-12 DIAGNOSIS — Z7901 Long term (current) use of anticoagulants: Secondary | ICD-10-CM

## 2024-01-12 LAB — POCT INR: INR: 1.7 — AB (ref 2.0–3.0)

## 2024-01-12 NOTE — Patient Instructions (Addendum)
 Pre visit review using our clinic review tool, if applicable. No additional management support is needed unless otherwise documented below in the visit note.  Increase dose today and tomorrow to take 3 1/2 tablets and then change weekly dose to take 2 1/2  tablets (5 mg) daily except take 3 tablets on Mondays and Fridays. Recheck on 6/18 at [facility].

## 2024-01-12 NOTE — Progress Notes (Addendum)
 Pt is a new mechanical valve pt and was placed on warfarin approximately 4-5 days ago.  Pt was d/c with a warfarin script for 2 mg tablets and prescribed to take 2 1/2 daily.  Pt reports starting protein shakes yesterday and would like to continue these shakes.  Increase dose today and tomorrow to take 3 1/2 tablets and then change weekly dose to take 2 1/2  tablets (5 mg) daily except take 3 tablets on Mondays and Fridays. Recheck on 6/18 at [facility].  Pt requested to schedule a hospital f/u with PCP. Sent msg to [facility] to assist with scheduling.

## 2024-01-16 ENCOUNTER — Encounter: Payer: Self-pay | Admitting: Internal Medicine

## 2024-01-16 ENCOUNTER — Ambulatory Visit (INDEPENDENT_AMBULATORY_CARE_PROVIDER_SITE_OTHER): Admitting: Internal Medicine

## 2024-01-16 VITALS — BP 120/62 | HR 102 | Temp 98.0°F | Ht 69.0 in | Wt 160.6 lb

## 2024-01-16 DIAGNOSIS — Z952 Presence of prosthetic heart valve: Secondary | ICD-10-CM | POA: Insufficient documentation

## 2024-01-16 DIAGNOSIS — D649 Anemia, unspecified: Secondary | ICD-10-CM | POA: Diagnosis not present

## 2024-01-16 DIAGNOSIS — Z7901 Long term (current) use of anticoagulants: Secondary | ICD-10-CM | POA: Insufficient documentation

## 2024-01-16 DIAGNOSIS — R000 Tachycardia, unspecified: Secondary | ICD-10-CM

## 2024-01-16 NOTE — Patient Instructions (Signed)
 VISIT SUMMARY:  You had a follow-up appointment today, three weeks after your mechanical mitral valve replacement surgery. Overall, you are adjusting well to the new valve and have been managing your recovery effectively. We discussed your current medications, heart rate, and post-surgery symptoms, as well as your anticoagulation management and anemia.  YOUR PLAN:  -POSTOPERATIVE CARE FOR MECHANICAL MITRAL VALVE REPLACEMENT: You are three weeks post-surgery and adjusting well. Your midline incision is glued and requires no attention, and the sutures from the drain tubes are ready for removal. Your heart rate remains elevated at rest, likely due to anemia. Continue taking your beta blocker at the current dose of 37.5 mg twice daily and monitor your heart rate. Follow up with your surgeon on Friday for suture removal and further evaluation.  -ANTICOAGULATION MANAGEMENT WITH COUMADIN  (WARFARIN): You are on Coumadin  to prevent blood clots after your valve replacement surgery. Your INR goal is 2.5 to 3.5. Continue to avoid grapefruit juice and alcohol, and maintain a consistent diet to help stabilize your INR levels. Use amoxicillin  for dental prophylaxis as prescribed. Regularly monitor your INR levels to manage the risk of bleeding.  -ANEMIA: You have significant anemia post-surgery, which may be contributing to your elevated heart rate. Anemia is a condition where you have a lower than normal number of red blood cells. We recommend taking B12 and iron supplements to help improve your blood cell production. Ensure you drink plenty of fluids to support your recovery.  INSTRUCTIONS:  Please follow up with your surgeon this Friday for suture removal and further evaluation. Continue to monitor your heart rate and communicate with the surgeon's nurse for any necessary medication adjustments. You are also on a waitlist for a cardiologist appointment in October. Make sure to attend all scheduled appointments and  follow the recommended guidelines for your medications and diet.

## 2024-01-16 NOTE — Assessment & Plan Note (Signed)
 He is on Coumadin  post-mechanical mitral valve replacement with an INR goal of 2.5 to 3.5. He is aware of dietary and monitoring requirements, including avoiding grapefruit juice and alcohol. He has an active prescription for amoxicillin  for dental prophylaxis. Discussed risks of bleeding, particularly intracranial, and the importance of consistent monitoring and dietary adherence. Continue Coumadin  therapy with regular INR monitoring. Avoid grapefruit juice and alcohol to prevent adverse interactions. Maintain consistency in diet to stabilize INR levels. Use amoxicillin  for dental prophylaxis as prescribed.

## 2024-01-16 NOTE — Progress Notes (Signed)
 ==============================  [facility]: [phone]   -- Medical Office Visit --  Patient: Perry Lewis      Age: 58 y.o.       Sex:  male  Date:   01/16/2024 Today's Healthcare Provider: Anthon Kins, MD  ==============================   Chief Complaint: Hospitalization Follow-up (Pt states he is doing better had heart vale replacement )  Discussed the use of AI scribe software for clinical note transcription with the patient, who gave verbal consent to proceed.  History of Present Illness Perry Lewis is a 58 year old male who presents for post-operative follow-up after a mechanical mitral valve replacement.  He is three weeks post-surgery for a mechanical mitral valve replacement. He is adjusting to the sound of the valve and has a follow-up appointment with the surgeon this Friday. He has four sutures from drain tubes and a glued midline incision that requires no attention. Initially, he experienced a twenty-pound weight gain due to fluid retention post-surgery but is now seven pounds below his pre-surgery weight.  He was prescribed a beta blocker in the hospital to manage his heart rate, currently taking 25 mg in the morning and evening. Recently, he increased the dose to one and a half pills twice daily due to persistent high heart rates, which remain in the 90 to 100 range at rest. The beta blocker takes one to two hours to take effect, reducing his heart rate from 105 to 95. He plans to discuss further adjustments with his healthcare team.  He feels good overall and has been walking a couple of miles a day. He has tapered off maximum strength Tylenol  after the first few days post-surgery and is not taking any narcotics. He has returned to normal gastrointestinal function.  He is aware of the dietary and monitoring requirements associated with Coumadin  use, including avoiding grapefruit juice and alcohol. He has an active  prescription for amoxicillin  for dental prophylaxis, which he has been using since his valve repair three years ago.  He has been experiencing anemia post-surgery, with a significant drop in hemoglobin levels noted in recent lab results. He has not been prescribed iron supplements but plans to discuss this with his healthcare team. He acknowledges feeling pale.  He mentions a social habit of consuming alcohol on weekends, which he is reconsidering due to his current medication regimen.   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 Past Medical History:  has a past medical history of Acute appendicitis s/p lap appendectomy 11/28/2018 (11/28/2018), Heart murmur (2015), Mitral regurgitation, Mitral valve prolapse, Rheumatoid arthritis (HCC), and S/P minimally invasive mitral valve repair (12/15/2020). Past Surgical History:   has a past surgical history that includes Hernia repair; laparoscopic appendectomy (N/A, 11/27/2018); TEE without cardioversion (N/A, 10/26/2020); Bubble study (10/26/2020); Mitral valve repair (Right, 12/15/2020); TEE without cardioversion (N/A, 12/15/2020); Appendectomy (2020); Eye surgery (1995); TRANSESOPHAGEAL ECHOCARDIOGRAM (N/A, 07/31/2023); and Mitral valve replacement (N/A, 11/2023). Social History:   reports that he quit smoking about 35 years ago. His smoking use included cigarettes. He started smoking about 42 years ago. He has  a 7  pack-year smoking history. He has never used smokeless tobacco. He reports current alcohol use of about 6.0 standard drinks of alcohol per week. He reports that he does not currently use drugs after having used the following drugs: Marijuana. Family History:  family history includes Alcohol abuse in his maternal grandfather and maternal grandmother; Depression in his mother; Early death in his mother; Varicose Veins in his mother. Allergies:  has no known allergies.   Medication Reconciliation: Current Outpatient Medications on File Prior to Visit  Medication Sig   amoxicillin  (AMOXIL ) 500 MG capsule Take 1,000 mg by mouth 2 (two) times daily.   aspirin  EC 81 MG EC tablet Take 1 tablet (81 mg total) by mouth daily. Swallow whole.   folic acid  (FOLVITE ) 1 MG tablet Take 2 mg by mouth daily.   methotrexate (RHEUMATREX) 2.5 MG tablet Take 15 mg by mouth every Tuesday. Caution:Chemotherapy. Protect from light.   metoprolol  tartrate (LOPRESSOR ) 25 MG tablet Take 25 mg by mouth. Plus half   Multiple Vitamins-Minerals (MULTIVITAMIN WITH MINERALS) tablet Take 1 tablet by mouth daily.   warfarin (COUMADIN ) 2 MG tablet Take 2 mg by mouth.   No current facility-administered medications on file prior to visit.  There are no discontinued medications.   Physical Exam:    01/16/2024    8:25 AM 12/01/2023    3:59 PM 10/04/2023    3:19 PM  Vitals with BMI  Height 5' 9 5' 9 5' 9  Weight 160 lbs 10 oz 161 lbs 6 oz 167 lbs  BMI 23.71 23.82 24.65  Systolic 120 112 295  Diastolic 62 80 82  Pulse 102 79 80  Vital signs reviewed.  Nursing notes reviewed. Weight trend reviewed. Physical Exam General Appearance:  No acute distress appreciable.   Well-groomed, healthy-appearing male.  Well proportioned with no abnormal fat distribution.  Good muscle tone. Pulmonary:  Normal work of breathing at rest, no respiratory distress apparent. SpO2: 98 %  Musculoskeletal: All extremities are intact.  Neurological:   Awake, alert, oriented, and engaged.  No obvious focal neurological deficits or cognitive impairments.  Sensorium seems unclouded.   Speech is clear and coherent with logical content. Psychiatric:  Appropriate mood, pleasant and cooperative demeanor, thoughtful and engaged during the exam Physical Exam GENERAL: Pale appearance, suggestive of anemia.  Results:    12/01/2023    4:01 PM 07/14/2022    3:30 PM 05/07/2021    7:16 AM 03/04/2021    9:25 AM  PHQ 2/9 Scores  PHQ - 2 Score 0 0 0 0  PHQ- 9 Score 0      Results LABS CBC: Hemoglobin low, RBC low (01/04/2024)  Used care everywhere to review outside labs- he doesn't want me to reorder them. Recent Data from [facility] Related to Complete Blood Count (CBC) Component 01/04/24 01/03/24 01/02/24 01/01/24 12/31/23 12/30/23  WBC (White Blood Cell Count) 7.9 8.6 7.1 6.9 11.8 High  15.7 High   Hemoglobin 7.5 Low  7.3 Low  7.3 Low  7.5 Low  8.1 Low  9 Low   Hematocrit 22.9 Low  21.3 Low  21.6 Low  22.2 Low  23.8 Low  26.4 Low   Platelets 436 337 260 194 162 165  MCV (Mean Corpuscular Volume) 95 93 94 94 93 93  MCH (Mean Corpuscular Hemoglobin) 31.0 32 31.7 31.6 31.8 31.7  MCHC (Mean Corpuscular Hemoglobin Concentration) 32.8 34.3 33.8 33.8 34 34.1  RBC (Red Blood Cell Count) 2.42 Low  2.28 Low  2.3 Low  2.37 Low  2.55 Low  2.84 Low   RDW-CV (Red Cell Distribution Width) 13.4 13.2 13.2 13.2 13 13.2  NRBC (Nucleated Red Blood Cell Count) 0.00 0.03 High  0.04 High  0 0 0  NRBC % (Nucleated Red Blood Cell %) 0.0 0.4 0.6 0 0 0  MPV (Mean Platelet Volume) 9.1 9.5 9.7 10.2 10.2 10.1      No results found for any visits on 01/16/24. Anti-coag visit on 01/12/2024  Component Date Value Ref Range Status   INR 01/12/2024 1.7 (A)  2.0 - 3.0 Final  Anti-coag visit on 01/05/2024  Component Date Value Ref Range Status   INR 01/05/2024 2.8  2.0 - 3.0 Final  Appointment on 09/12/2023  Component Date Value Ref Range Status    Area-P 1/2 09/12/2023 2.62  cm2 Corrected   S' Lateral 09/12/2023 2.34  cm Corrected   MV VTI 09/12/2023 3.17  cm2 Corrected  Admission on 07/31/2023, Discharged on 07/31/2023  Component Date Value Ref Range Status   Est EF 07/31/2023 60 - 65%   Final  Office Visit on 07/06/2023  Component Date Value Ref Range Status   Glucose 07/06/2023 91  70 - 99 mg/dL Final   BUN 82/95/6213 12  6 - 24 mg/dL Final   Creatinine, Ser 07/06/2023 0.81  0.76 - 1.27 mg/dL Final   eGFR 08/65/7846 102  >59 mL/min/1.73 Final   BUN/Creatinine Ratio 07/06/2023 15  9 - 20 Final   Sodium 07/06/2023 139  134 - 144 mmol/L Final   Potassium 07/06/2023 5.3 (H)  3.5 - 5.2 mmol/L Final   Chloride 07/06/2023 100  96 - 106 mmol/L Final   CO2 07/06/2023 23  20 - 29 mmol/L Final   Calcium 07/06/2023 10.1  8.7 - 10.2 mg/dL Final   WBC 96/29/5284 6.1  3.4 - 10.8 x10E3/uL Final   RBC 07/06/2023 4.13 (L)  4.14 - 5.80 x10E6/uL Final   Hemoglobin 07/06/2023 13.3  13.0 - 17.7 g/dL Final   Hematocrit 13/24/4010 39.9  37.5 - 51.0 % Final   MCV 07/06/2023 97  79 - 97 fL Final   MCH 07/06/2023 32.2  26.6 - 33.0 pg Final   MCHC 07/06/2023 33.3  31.5 - 35.7 g/dL Final   RDW 27/25/3664 13.2  11.6 - 15.4 % Final   Platelets 07/06/2023 311  150 - 450 x10E3/uL Final  Appointment on 05/17/2023  Component Date Value Ref Range Status   Est EF 05/17/2023 55 - 60%   Final  Lab on 08/08/2022  Component Date Value Ref Range Status   Color, Urine 08/08/2022 YELLOW  Yellow;Lt. Yellow;Straw;Dark Yellow;Amber;Green;Red;Brown Final   APPearance 08/08/2022 CLEAR  Clear;Turbid;Slightly Cloudy;Cloudy Final   Specific Gravity, Urine 08/08/2022 1.015  1.000 - 1.030 Final   pH 08/08/2022 7.0  5.0 - 8.0 Final   Total Protein, Urine 08/08/2022 NEGATIVE  Negative Final   Urine Glucose 08/08/2022 NEGATIVE  Negative Final   Ketones, ur 08/08/2022 NEGATIVE  Negative Final   Bilirubin Urine 08/08/2022 NEGATIVE  Negative Final   Hgb urine dipstick  08/08/2022 NEGATIVE  Negative Final   Urobilinogen, UA 08/08/2022 0.2  0.0 - 1.0 Final   Leukocytes,Ua 08/08/2022 NEGATIVE  Negative Final   Nitrite 08/08/2022 NEGATIVE  Negative Final   WBC, UA 08/08/2022 0-2/hpf  0-2/hpf Final   RBC / HPF 08/08/2022 none seen  0-2/hpf Final   Squamous Epithelial / HPF 08/08/2022 Rare(0-4/hpf)  Rare(0-4/hpf) Final   PSA 08/08/2022 0.35  0.10 -  4.00 ng/mL Final   HIV 1&2 Ab, 4th Generation 08/08/2022 NON-REACTIVE  NON-REACTIVE Final   Hepatitis C Ab 08/08/2022 NON-REACTIVE  NON-REACTIVE Final   Cholesterol 08/08/2022 189  0 - 200 mg/dL Final   Triglycerides 78/29/5621 91.0  0.0 - 149.0 mg/dL Final   HDL 30/86/5784 48.80  >39.00 mg/dL Final   VLDL 69/62/9528 18.2  0.0 - 40.0 mg/dL Final   LDL Cholesterol 08/08/2022 122 (H)  0 - 99 mg/dL Final   Total CHOL/HDL Ratio 08/08/2022 4   Final   NonHDL 08/08/2022 140.60   Final   Sodium 08/08/2022 136  135 - 145 mEq/L Final   Potassium 08/08/2022 4.6  3.5 - 5.1 mEq/L Final   Chloride 08/08/2022 98  96 - 112 mEq/L Final   CO2 08/08/2022 29  19 - 32 mEq/L Final   Glucose, Bld 08/08/2022 100 (H)  70 - 99 mg/dL Final   BUN 41/32/4401 13  6 - 23 mg/dL Final   Creatinine, Ser 08/08/2022 0.84  0.40 - 1.50 mg/dL Final   Total Bilirubin 08/08/2022 0.6  0.2 - 1.2 mg/dL Final   Alkaline Phosphatase 08/08/2022 70  39 - 117 U/L Final   AST 08/08/2022 23  0 - 37 U/L Final   ALT 08/08/2022 23  0 - 53 U/L Final   Total Protein 08/08/2022 7.0  6.0 - 8.3 g/dL Final   Albumin  08/08/2022 4.5  3.5 - 5.2 g/dL Final   GFR 02/72/5366 97.05  >60.00 mL/min Final   Calcium 08/08/2022 9.8  8.4 - 10.5 mg/dL Final   WBC 44/09/4740 6.1  4.0 - 10.5 K/uL Final   RBC 08/08/2022 4.30  4.22 - 5.81 Mil/uL Final   Hemoglobin 08/08/2022 13.9  13.0 - 17.0 g/dL Final   HCT 59/56/3875 39.9  39.0 - 52.0 % Final   MCV 08/08/2022 92.9  78.0 - 100.0 fl Final   MCHC 08/08/2022 34.8  30.0 - 36.0 g/dL Final   RDW 64/33/2951 13.5  11.5 - 15.5 % Final    Platelets 08/08/2022 333.0  150.0 - 400.0 K/uL Final   Neutrophils Relative % 08/08/2022 57.0  43.0 - 77.0 % Final   Lymphocytes Relative 08/08/2022 33.7  12.0 - 46.0 % Final   Monocytes Relative 08/08/2022 7.7  3.0 - 12.0 % Final   Eosinophils Relative 08/08/2022 1.3  0.0 - 5.0 % Final   Basophils Relative 08/08/2022 0.3  0.0 - 3.0 % Final   Neutro Abs 08/08/2022 3.5  1.4 - 7.7 K/uL Final   Lymphs Abs 08/08/2022 2.0  0.7 - 4.0 K/uL Final   Monocytes Absolute 08/08/2022 0.5  0.1 - 1.0 K/uL Final   Eosinophils Absolute 08/08/2022 0.1  0.0 - 0.7 K/uL Final   Basophils Absolute 08/08/2022 0.0  0.0 - 0.1 K/uL Final  Appointment on 04/22/2022  Component Date Value Ref Range Status   S' Lateral 04/22/2022 2.84  cm Final   AV Area VTI 04/22/2022 3.68  cm2 Final   AV Mean grad 04/22/2022 6.0  mmHg Final   Single Plane A4C EF 04/22/2022 62.1  % Final   Single Plane A2C EF 04/22/2022 60.8  % Final   Calc EF 04/22/2022 61.5  % Final   AV Area mean vel 04/22/2022 3.69  cm2 Final   Area-P 1/2 04/22/2022 3.68  cm2 Final   AR max vel 04/22/2022 3.45  cm2 Final   AV Peak grad 04/22/2022 10.8  mmHg Final   Ao pk vel 04/22/2022 1.64  m/s Final  MV VTI 04/22/2022 3.32  cm2 Final  No image results found. No results found.      Assessment & Plan History of mitral valve replacement with mechanical valve He is on Coumadin  post-mechanical mitral valve replacement with an INR goal of 2.5 to 3.5. He is aware of dietary and monitoring requirements, including avoiding grapefruit juice and alcohol. He has an active prescription for amoxicillin  for dental prophylaxis. Discussed risks of bleeding, particularly intracranial, and the importance of consistent monitoring and dietary adherence. Continue Coumadin  therapy with regular INR monitoring. Avoid grapefruit juice and alcohol to prevent adverse interactions. Maintain consistency in diet to stabilize INR levels. Use amoxicillin  for dental prophylaxis as  prescribed. Anemia, unspecified type He has significant anemia post-surgery with low hemoglobin levels warranting transfusion. Anemia may contribute to elevated heart rate. Blood counts were normal six months ago. Recommend B12 and iron supplementation to aid in blood cell production. Encourage adequate fluid intake to support overall recovery and blood volume restoration. Anticoagulated on Coumadin  He is on Coumadin  post-mechanical mitral valve replacement with an INR goal of 2.5 to 3.5. He is aware of dietary and monitoring requirements, including avoiding grapefruit juice and alcohol. He has an active prescription for amoxicillin  for dental prophylaxis. Discussed risks of bleeding, particularly intracranial, and the importance of consistent monitoring and dietary adherence. Continue Coumadin  therapy with regular INR monitoring. Avoid grapefruit juice and alcohol to prevent adverse interactions. Maintain consistency in diet to stabilize INR levels. Use amoxicillin  for dental prophylaxis as prescribed. Tachycardia He is scheduled for a follow-up with the surgeon this Friday and is on a waitlist for a cardiologist appointment in October. Continue to monitor heart rate and communicate with the surgeon's nurse for medication adjustments. Attend the follow-up appointment with the surgeon on Friday and await the cardiology appointment in October.  I encouraged him to drink lots of fluid, supplement iron and B12.  He asked that I not pressure him to do labwork but I think anemia is a major cause.      This document was synthesized by artificial intelligence ([employer]) using HIPAA-compliant recording of the clinical interaction;   We discussed the use of AI scribe software for clinical note transcription with the patient, who gave verbal consent to proceed. additional Info: This encounter employed state-of-the-art, real-time, collaborative documentation. The patient actively reviewed and assisted in updating  their electronic medical record on a shared screen, ensuring transparency and facilitating joint problem-solving for the problem list, overview, and plan. This approach promotes accurate, informed care. The treatment plan was discussed and reviewed in detail, including medication safety, potential side effects, and all patient questions. We confirmed understanding and comfort with the plan. Follow-up instructions were established, including contacting the office for any concerns, returning if symptoms worsen, persist, or new symptoms develop, and precautions for potential emergency department visits.

## 2024-01-16 NOTE — Assessment & Plan Note (Signed)
 He has significant anemia post-surgery with low hemoglobin levels warranting transfusion. Anemia may contribute to elevated heart rate. Blood counts were normal six months ago. Recommend B12 and iron supplementation to aid in blood cell production. Encourage adequate fluid intake to support overall recovery and blood volume restoration.

## 2024-01-17 ENCOUNTER — Ambulatory Visit (INDEPENDENT_AMBULATORY_CARE_PROVIDER_SITE_OTHER)

## 2024-01-17 DIAGNOSIS — Z7901 Long term (current) use of anticoagulants: Secondary | ICD-10-CM | POA: Diagnosis not present

## 2024-01-17 LAB — POCT INR: INR: 1.8 — AB (ref 2.0–3.0)

## 2024-01-17 NOTE — Patient Instructions (Addendum)
 Pre visit review using our clinic review tool, if applicable. No additional management support is needed unless otherwise documented below in the visit note.  Increase dose today and tomorrow to take 3 1/2 tablets and then change weekly dose to take 3  tablets (5 mg) daily except take 2 1/2 tablets on Mondays and Thursdays. Recheck in 1 week.

## 2024-01-17 NOTE — Progress Notes (Signed)
 Indication; mechanical heart valve Pt was d/c with a warfarin script for 2 mg tablets and prescribed to take 2 1/2 daily.  Pt reports starting protein shakes yesterday and would like to continue these shakes.  Increase dose today and tomorrow to take 3 1/2 tablets and then change weekly dose to take 3  tablets (5 mg) daily except take 2 1/2 tablets on Mondays and Thursdays. Recheck in 1 week.

## 2024-01-19 DIAGNOSIS — J90 Pleural effusion, not elsewhere classified: Secondary | ICD-10-CM | POA: Diagnosis not present

## 2024-01-19 DIAGNOSIS — Z79899 Other long term (current) drug therapy: Secondary | ICD-10-CM | POA: Diagnosis not present

## 2024-01-19 DIAGNOSIS — Z7901 Long term (current) use of anticoagulants: Secondary | ICD-10-CM | POA: Diagnosis not present

## 2024-01-19 DIAGNOSIS — J9811 Atelectasis: Secondary | ICD-10-CM | POA: Diagnosis not present

## 2024-01-19 DIAGNOSIS — Z952 Presence of prosthetic heart valve: Secondary | ICD-10-CM | POA: Diagnosis not present

## 2024-01-19 DIAGNOSIS — Z7982 Long term (current) use of aspirin: Secondary | ICD-10-CM | POA: Diagnosis not present

## 2024-01-19 DIAGNOSIS — Z48812 Encounter for surgical aftercare following surgery on the circulatory system: Secondary | ICD-10-CM | POA: Diagnosis not present

## 2024-01-22 ENCOUNTER — Telehealth: Payer: Self-pay

## 2024-01-22 NOTE — Telephone Encounter (Signed)
 Pt reports he had a f/u apt on 6/20 with [facility] and they placed him on Lovenox , 80 mg Q12H. They reported his INR was subtherapeutic and that he should remain on Lovenox  until therapeutic.   Pt was d/c from hospital on 6/5 with INR of 2.6 (range 2.5-3.5), and was not prescribed Lovenox  for home use. Then on 6/6 he was seen at coumadin  clinic and INR was 2.8.   Today cardiothoracic checked INR and it was 2.0.   Pt was in coumadin  clinic on 6/18 and warfarin dosing was changed to bring subtherapeutic INR up. This is only 3 days so full effect of weekly dose change has not been realized yet.   Last 2 INR checks in coumadin  clinic were subtherapeutic due to change in pt's diet. Pt has added additional protein to diet which will decrease INR. But, INR has been steadily increasing with each visit. Protocols for warfarin therapy is once pt has had a therapeutic INR when starting warfarin, Lovenox  is d/c. Lovenox  injections are not recommended if any pt is subtherapeutic unless just starting warfarin and have not had a therapeutic INR or using a Lovenox  bridge for surgery.   Pt would like to get off of the injections as soon as possible. Pt is now scheduled for INR check for 6/27. Will inquire if pt wants to move apt to 6/25.   LVM  Pt returned call. Reports cardiothoracic prescribed Lovenox  Q12H dosing and sent in enough for 30 days. Explained above information to pt. Pt is in agreement to continue Lovenox  at least until INR recheck on 6/27. He decided to keep already scheduled coumadin  clinic apt for this Friday. Advised if any s/s of bleeding or abnormal bruising to go to ER. Pt verbalized understanding.

## 2024-01-23 ENCOUNTER — Encounter (HOSPITAL_COMMUNITY): Payer: Self-pay

## 2024-01-23 ENCOUNTER — Other Ambulatory Visit: Payer: Self-pay

## 2024-01-23 ENCOUNTER — Emergency Department (HOSPITAL_COMMUNITY)

## 2024-01-23 ENCOUNTER — Emergency Department (HOSPITAL_COMMUNITY)
Admission: EM | Admit: 2024-01-23 | Discharge: 2024-01-23 | Disposition: A | Discharge status: Home / Self Care | Attending: Emergency Medicine | Admitting: Emergency Medicine

## 2024-01-23 ENCOUNTER — Ambulatory Visit: Admitting: Physician Assistant

## 2024-01-23 ENCOUNTER — Telehealth: Payer: Self-pay | Admitting: Cardiovascular Disease

## 2024-01-23 DIAGNOSIS — R002 Palpitations: Secondary | ICD-10-CM | POA: Insufficient documentation

## 2024-01-23 DIAGNOSIS — R918 Other nonspecific abnormal finding of lung field: Secondary | ICD-10-CM | POA: Diagnosis not present

## 2024-01-23 DIAGNOSIS — Z7982 Long term (current) use of aspirin: Secondary | ICD-10-CM | POA: Insufficient documentation

## 2024-01-23 DIAGNOSIS — G8918 Other acute postprocedural pain: Secondary | ICD-10-CM | POA: Insufficient documentation

## 2024-01-23 DIAGNOSIS — R609 Edema, unspecified: Secondary | ICD-10-CM | POA: Diagnosis not present

## 2024-01-23 DIAGNOSIS — Z7901 Long term (current) use of anticoagulants: Secondary | ICD-10-CM | POA: Diagnosis not present

## 2024-01-23 DIAGNOSIS — R0602 Shortness of breath: Secondary | ICD-10-CM | POA: Diagnosis not present

## 2024-01-23 DIAGNOSIS — R079 Chest pain, unspecified: Secondary | ICD-10-CM | POA: Diagnosis not present

## 2024-01-23 LAB — I-STAT CHEM 8, ED
BUN: 12 mg/dL (ref 6–20)
Calcium, Ion: 1.18 mmol/L (ref 1.15–1.40)
Chloride: 99 mmol/L (ref 98–111)
Creatinine, Ser: 0.8 mg/dL (ref 0.61–1.24)
Glucose, Bld: 104 mg/dL — ABNORMAL HIGH (ref 70–99)
HCT: 31 % — ABNORMAL LOW (ref 39.0–52.0)
Hemoglobin: 10.5 g/dL — ABNORMAL LOW (ref 13.0–17.0)
Potassium: 4.5 mmol/L (ref 3.5–5.1)
Sodium: 135 mmol/L (ref 135–145)
TCO2: 25 mmol/L (ref 22–32)

## 2024-01-23 LAB — MAGNESIUM: Magnesium: 2.2 mg/dL (ref 1.7–2.4)

## 2024-01-23 LAB — BASIC METABOLIC PANEL WITH GFR
Anion gap: 10 (ref 5–15)
BUN: 12 mg/dL (ref 6–20)
CO2: 25 mmol/L (ref 22–32)
Calcium: 9.5 mg/dL (ref 8.9–10.3)
Chloride: 100 mmol/L (ref 98–111)
Creatinine, Ser: 0.77 mg/dL (ref 0.61–1.24)
GFR, Estimated: >60 mL/min (ref >60)
Glucose, Bld: 105 mg/dL — ABNORMAL HIGH (ref 70–99)
Potassium: 4.5 mmol/L (ref 3.5–5.1)
Sodium: 135 mmol/L (ref 135–145)

## 2024-01-23 LAB — CBC WITH DIFFERENTIAL/PLATELET
Abs Immature Granulocytes: 0.02 10*3/uL (ref 0.00–0.07)
Basophils Absolute: 0 10*3/uL (ref 0.0–0.1)
Basophils Relative: 0 %
Eosinophils Absolute: 0.1 10*3/uL (ref 0.0–0.5)
Eosinophils Relative: 1 %
HCT: 30.2 % — ABNORMAL LOW (ref 39.0–52.0)
Hemoglobin: 9.4 g/dL — ABNORMAL LOW (ref 13.0–17.0)
Immature Granulocytes: 0 %
Lymphocytes Relative: 28 %
Lymphs Abs: 1.9 10*3/uL (ref 0.7–4.0)
MCH: 28.6 pg (ref 26.0–34.0)
MCHC: 31.1 g/dL (ref 30.0–36.0)
MCV: 91.8 fL (ref 80.0–100.0)
Monocytes Absolute: 0.6 10*3/uL (ref 0.1–1.0)
Monocytes Relative: 9 %
Neutro Abs: 4.1 10*3/uL (ref 1.7–7.7)
Neutrophils Relative %: 62 %
Platelets: 481 10*3/uL — ABNORMAL HIGH (ref 150–400)
RBC: 3.29 MIL/uL — ABNORMAL LOW (ref 4.22–5.81)
RDW: 14.9 % (ref 11.5–15.5)
WBC: 6.7 10*3/uL (ref 4.0–10.5)
nRBC: 0 % (ref 0.0–0.2)

## 2024-01-23 LAB — PROTIME-INR
INR: 1.9 — ABNORMAL HIGH (ref 0.8–1.2)
Prothrombin Time: 21.7 s — ABNORMAL HIGH (ref 11.4–15.2)

## 2024-01-23 LAB — TROPONIN I (HIGH SENSITIVITY)
Troponin I (High Sensitivity): 10 ng/L (ref <18)
Troponin I (High Sensitivity): 10 ng/L (ref <18)

## 2024-01-23 LAB — BRAIN NATRIURETIC PEPTIDE: B Natriuretic Peptide: 91.1 pg/mL (ref 0.0–100.0)

## 2024-01-23 MED ORDER — IOHEXOL 350 MG/ML SOLN
75.0000 mL | Freq: Once | INTRAVENOUS | Status: AC | PRN
  Administered 2024-01-23: 75 mL via INTRAVENOUS

## 2024-01-23 NOTE — Telephone Encounter (Addendum)
 With active chest pain going on and tachycardia in the post-op state, concerned that he needs sooner ED eval with labs, possible PE rule out (?interruption in anticoag for surgery), and eval for possible post-op afib/flutter - worried we would just be sending him to ED from OV since expedited testing is difficult in outpatient setting. [facility] spoke with patient who is agreeable to plan. Patient confirms ongoing chest pain. Patient will proceed to ED so appt cancelled.

## 2024-01-23 NOTE — ED Triage Notes (Signed)
 Patient had recent mitral valve replacement 4 weeks ago and over the past few days having palpitations and was told by his MD to come to ER.  Incisions are healing without redness.

## 2024-01-23 NOTE — Telephone Encounter (Signed)
 Pt called this morning and requested coumadin  clinic apt be moved to tomorrow at [facility]. If he waits until 6/27 he will need to buy another box of injections and so wants to test tomorrow so if he is in range he can d/c the injections.  Rs for [facility] tomorrow.

## 2024-01-23 NOTE — ED Provider Notes (Signed)
 [facility] Lewis EMERGENCY DEPARTMENT AT [facility] Provider Note   CSN: [id number] Arrival date & time: 01/23/24  1217     Patient presents with: Palpitations   Perry Lewis is a 58 y.o. male.   Patient with history of sternotomy with mitral valve replacement on 12/28/2023 at [facility] presents today with concern for chest pain and palpitations.  He states that he has been doing well following his surgery until Sunday when he started to have some discomfort in his chest.  He states that immediately following surgery he did have some pain that was attributed to acid reflux and therefore he felt the pain he was experiencing on Sunday was likely this as well.  He took some Tums but did not have significant improvement.  He also notes that he has been feeling a fluttering sensation in his chest since his pain began on Sunday as well.  He has been checking his smart watch EKGs which have all read sinus rhythm. States these symptoms have been persistent since onset without any aggravating or relieving factors.  Denies any significant shortness of breath.  No fevers or chills.  No leg pain or leg swelling.  No diaphoresis, nausea, or vomiting.  Pain is in his upper chest and does not radiate.  He called his cardiologist and tried to schedule an appointment but they were unavailable, he discussed his symptoms with them and they recommended that he come to the ER for evaluation.  He presents for same.  Does also note that due to his mechanical valve placement, he is on warfarin, has been having subtherapeutic INR's and given this, on Friday he was told to add Lovenox  injections which he has been compliant with. Additionally, patient notes that he has been on Toprol -XR for tachycardia and his heart rate has continued to be in the 90s following taking this medication and then jumps back into the 100s 4-6 hours after taking this medication.  The history is provided by the patient. No language interpreter  was used.  Palpitations Associated symptoms: chest pain        Prior to Admission medications   Medication Sig Start Date End Date Taking? Authorizing Provider  amoxicillin  (AMOXIL ) 500 MG capsule Take 1,000 mg by mouth 2 (two) times daily. 12/07/23   [provider]  aspirin  EC 81 MG EC tablet Take 1 tablet (81 mg total) by mouth daily. Swallow whole. 12/19/20   Roddenberry, Myron G, PA-C  folic acid  (FOLVITE ) 1 MG tablet Take 2 mg by mouth daily.    [provider]  methotrexate (RHEUMATREX) 2.5 MG tablet Take 15 mg by mouth every Tuesday. Caution:Chemotherapy. Protect from light.    [provider]  metoprolol  tartrate (LOPRESSOR ) 25 MG tablet Take 25 mg by mouth. Plus half 12/30/23 01/03/25  [provider]  Multiple Vitamins-Minerals (MULTIVITAMIN WITH MINERALS) tablet Take 1 tablet by mouth daily.    [provider]  warfarin (COUMADIN ) 2 MG tablet Take 2 mg by mouth. 01/04/24 01/03/25  [provider]    Allergies: Patient has no known allergies.    Review of Systems  Cardiovascular:  Positive for chest pain and palpitations.  All other systems reviewed and are negative.   Updated Vital Signs BP (!) 137/95 (BP Location: Left Arm)   Pulse 97   Temp 98.1 F (36.7 C) (Oral)   Resp 17   Ht 5' 9 (1.753 m)   Wt 72.6 kg   SpO2 99%   BMI 23.63  kg/m   Physical Exam Vitals and nursing note reviewed.  Constitutional:      General: He is not in acute distress.    Appearance: Normal appearance. He is normal weight. He is not ill-appearing, toxic-appearing or diaphoretic.  HENT:     Head: Normocephalic and atraumatic.   Eyes:     Extraocular Movements: Extraocular movements intact.     Pupils: Pupils are equal, round, and reactive to light.    Cardiovascular:     Rate and Rhythm: Normal rate and regular rhythm.     Heart sounds: Normal heart sounds.  Pulmonary:     Effort: Pulmonary effort is normal. No respiratory distress.   Chest:     Comments: Well-healing midline sternotomy scar without any erythema, warmth, fluctuance, induration, or drainage. Abdominal:     General: Abdomen is flat.     Palpations: Abdomen is soft.     Tenderness: There is no abdominal tenderness.   Musculoskeletal:        General: Normal range of motion.     Cervical back: Normal range of motion.     Right lower leg: No edema.     Left lower leg: No edema.   Skin:    General: Skin is warm and dry.   Neurological:     General: No focal deficit present.     Mental Status: He is alert.   Psychiatric:        Mood and Affect: Mood normal.        Behavior: Behavior normal.     (all labs ordered are listed, but only abnormal results are displayed) Labs Reviewed  BASIC METABOLIC PANEL WITH GFR - Abnormal; Notable for the following components:      Result Value   Glucose, Bld 105 (*)    All other components within normal limits  CBC WITH DIFFERENTIAL/PLATELET - Abnormal; Notable for the following components:   RBC 3.29 (*)    Hemoglobin 9.4 (*)    HCT 30.2 (*)    Platelets 481 (*)    All other components within normal limits  PROTIME-INR - Abnormal; Notable for the following components:   Prothrombin Time 21.7 (*)    INR 1.9 (*)    All other components within normal limits  I-STAT CHEM 8, ED - Abnormal; Notable for the following components:   Glucose, Bld 104 (*)    Hemoglobin 10.5 (*)    HCT 31.0 (*)    All other components within normal limits  BRAIN NATRIURETIC PEPTIDE  MAGNESIUM   TROPONIN I (HIGH SENSITIVITY)  TROPONIN I (HIGH SENSITIVITY)    EKG: EKG Interpretation Date/Time:  Tuesday January 23 2024 12:30:34 EDT Ventricular Rate:  94 PR Interval:  178 QRS Duration:  86 QT Interval:  346 QTC Calculation: 432 R Axis:   13  Text Interpretation: Normal sinus rhythm Possible Inferior infarct , age undetermined Abnormal ECG When compared with ECG of 06-Jul-2023 11:05, No significant change since last tracing  Confirmed by Towana Sharper [phone]) on 01/23/2024 12:45:27 PM  Radiology: CT Angio Chest PE W and/or Wo Contrast Result Date: 01/23/2024 CLINICAL DATA:  Chest pain, palpitations, and shortness of breath. History of mitral valve replacement in 12/28/2023 (postoperative day 26). EXAM: CT ANGIOGRAPHY CHEST WITH CONTRAST TECHNIQUE: Multidetector CT imaging of the chest was performed using the standard protocol during bolus administration of intravenous contrast. Multiplanar CT image reconstructions and MIPs were obtained to evaluate the vascular anatomy. RADIATION DOSE REDUCTION: This exam was performed according to the departmental  dose-optimization program which includes automated exposure control, adjustment of the mA and/or kV according to patient size and/or use of iterative reconstruction technique. CONTRAST:  75mL OMNIPAQUE  IOHEXOL  350 MG/ML SOLN COMPARISON:  10/06/2020 FINDINGS: Cardiovascular: No filling defect is identified in the pulmonary arterial tree to suggest pulmonary embolus. Radiodensities along the margins of the ascending aorta likely reflect postoperative findings from recent operative intervention. Miniscule loculation of gas density in the superior pericardial recess adjacent to the thoracic aorta on image 55 series 4, without substantial enhancement along the margins of the superior pericardial recess to further suggest infection, an without obvious leak in the vicinity of the esophagus or proximal tracheobronchial tree. Otherwise trace anterior pericardial thickening as expected in the postoperative setting. No visible aortic or branch vessel dissection. Mitral valve prosthesis observed. Mediastinum/Nodes: Low-grade edema in the mediastinum not unexpected for postoperative scenario. No adenopathy. Lungs/Pleura: Mild bilateral airway thickening. Mild lingular scarring or atelectasis. 0.9 by 1.7 by 0.6 cm density along the right diaphragmatic pleural surface without surrounding fluid density.  This is probably a small calcified pleural plaque. Gossypiboma considered substantially less likely given the configuration. Upper Abdomen: Unremarkable Musculoskeletal: Healing sternotomy site without evidence of dehiscence. Review of the MIP images confirms the above findings. IMPRESSION: 1. No filling defect is identified in the pulmonary arterial tree to suggest pulmonary embolus. 2. Miniscule loculation of gas density in the superior pericardial recess adjacent to the thoracic aorta, without substantial enhancement along the margins of the superior pericardial recess to further suggest infection, an without obvious leak in the vicinity of the esophagus or proximal tracheobronchial tree. Presumably this simply represents prolonged postoperative gas in the pericardial space as I would expect infectious pericarditis to manifest with substantially more of a pericardial effusion. Low threshold for reimaging recommended if the patient demonstrates signs of infection or mediastinitis. 3. Airway thickening is present, suggesting bronchitis or reactive airways disease. 4. 0.9 by 1.7 by 0.6 cm density along the right diaphragmatic pleural surface without surrounding fluid density. This is probably a small calcified pleural plaque. Gossypiboma considered substantially less likely given the configuration. 5. Healing sternotomy site without evidence of dehiscence. Electronically Signed   By: Ryan Salvage M.D.   On: 01/23/2024 15:19   DG Chest 2 View Result Date: 01/23/2024 CLINICAL DATA:  Centralized/left-sided chest pain. EXAM: CHEST - 2 VIEW COMPARISON:  January 18, 2021 FINDINGS: Multiple sternal wires are noted. The heart size and mediastinal contours are within normal limits. An artificial mitral valve is seen. Both lungs are clear. The visualized skeletal structures are unremarkable. IMPRESSION: No active cardiopulmonary disease. Electronically Signed   By: Suzen Dials M.D.   On: 01/23/2024 14:19      Procedures   Medications Ordered in the ED  iohexol  (OMNIPAQUE ) 350 MG/ML injection 75 mL (75 mLs Intravenous Contrast Given 01/23/24 1431)                                    Medical Decision Making  This patient is a 58 y.o. male who presents to the ED for concern of chest pain, palpitations, this involves an extensive number of treatment options, and is a complaint that carries with it a high risk of complications and morbidity. The emergent differential diagnosis prior to evaluation includes, but is not limited to,  ACS, pericarditis, myocarditis, aortic dissection, PE, pneumothorax, esophageal rupture, pneumonia, reflux/PUD, biliary disease, pancreatitis, costochondritis, anxiety  Fluttering: Cardiac arrhythmias,  ACS, CHF, pericarditis, valvular disease, panic/anxiety, ETOH, stimulant use, medication side effect, anemia, hyperthyroidism, pulmonary embolism.  This is not an exhaustive differential.   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  Additional history: Chart reviewed. Pertinent results include: had sternotomy with mitral valve repair, mechanical valve placement on 12/28/23 at [facility]  Physical Exam: Physical exam performed. The pertinent findings include: well appearing, speaking in complete sentences in no acute distress. Well healing midline surgical scar without signs of infection or drainage  Lab Tests: I ordered, and personally interpreted labs.  The pertinent results include:  hgb 9.4.  INR 1.9.  Troponin 10 --> 10.   Imaging Studies: I ordered imaging studies including CXR, CTA PE. I independently visualized and interpreted imaging which showed   CXR: NAD  CTA:   1. No filling defect is identified  in the pulmonary arterial tree to suggest pulmonary embolus. 2. Miniscule loculation of gas density in the superior pericardial recess adjacent to the thoracic aorta, without substantial enhancement along the margins of the superior pericardial recess to further suggest infection, an without obvious leak in the vicinity of the esophagus or proximal tracheobronchial tree. Presumably this simply represents prolonged postoperative gas in the pericardial space as I would expect infectious pericarditis to manifest with substantially more of a pericardial effusion. Low threshold for reimaging recommended if the patient demonstrates signs of infection or mediastinitis. 3. Airway thickening is present, suggesting bronchitis or reactive airways disease. 4. 0.9 by 1.7 by 0.6 cm density along the right diaphragmatic pleural surface without surrounding fluid density. This is probably a small calcified pleural plaque. Gossypiboma considered substantially less likely given the configuration. 5. Healing sternotomy site without evidence of dehiscence.  I agree with the radiologist interpretation.   Cardiac Monitoring:  The patient was maintained on a cardiac monitor.  Cardiac monitor showed an underlying rhythm of: sinus rhythm, no STEMI. I agree with this interpretation.  Consultations Obtained: I requested consultation with the CT surgery on call Dr. Lucas,  and discussed lab and imaging findings as well as pertinent plan - they recommend: they reviewed the patients CT scan and given no laboratory abnormalities or vital signs or suggest infection, were not concerned for infection, felt postoperative gas at this stage post op would be expected, did not have concern for gossypiboma or any other concerns warranting additional evaluation. Felt that d/c with lifting precautions, pain control and outpatient follow-up was sufficient.    Disposition: After consideration of the diagnostic results and the patients response  to treatment, I feel that emergency department workup does not suggest an emergent condition requiring admission or immediate intervention beyond what has been performed at this time. The plan is: Discharge with close outpatient follow-up and return precautions.  Patient is extremely well-appearing, his labs are benign.  He has no signs or symptoms to suggest infection.  CT did not show any evidence of PE.  Suspect pain is postoperative.  Unclear etiology of fluttering, EKG shows sinus rhythm and his heart rate is within normal limits.  He may need to change his dosage of metoprolol  given that he is still mildly tachycardic despite this medication, feel this can be managed outpatient.  Recommend he call his cardiologist to schedule follow-up appointment.  Evaluation and diagnostic testing in the emergency department does not  suggest an emergent condition requiring admission or immediate intervention beyond what has been performed at this time.  Plan for discharge with close PCP follow-up.  Patient is understanding and amenable with plan, educated on red flag symptoms that would prompt immediate return.  Patient discharged in stable condition.   This is a shared visit with supervising physician Dr. Franklyn who has independently evaluated patient & provided guidance in evaluation/management/disposition, in agreement with care   Final diagnoses:  Post-op pain  Palpitations    ED Discharge Orders     None     An After Visit Summary was printed and given to the patient.      Nora Lauraine LABOR, PA-C 01/23/24 1732    Franklyn Sid SAILOR, MD 01/26/24 1001

## 2024-01-23 NOTE — ED Provider Triage Note (Signed)
 Emergency Medicine Provider Triage Evaluation Note  Perry Lewis , a 58 y.o. male  was evaluated in triage.  Pt complains of chest pain, palpitations, shortness of breath.  Ongoing since Sunday.  Recent history of mitral valve replacement.  Subtherapeutic INR range.  Has been doing bridge Lovenox  injections since Friday..  Review of Systems  Positive: As above Negative: As above  Physical Exam  BP 132/89 (BP Location: Right Arm)   Pulse 93   Temp 99.5 F (37.5 C)   Resp 18   Ht 5' 9 (1.753 m)   Wt 72.6 kg   SpO2 100%   BMI 23.63 kg/m  Gen:   Awake, no distress   Resp:  Normal effort  MSK:   Moves extremities without difficulty  Other:    Medical Decision Making  Medically screening exam initiated at 12:45 PM.  Appropriate orders placed.  Perry Lewis was informed that the remainder of the evaluation will be completed by another provider, this initial triage assessment does not replace that evaluation, and the importance of remaining in the ED until their evaluation is complete.    Hildegard Loge, PA-C 01/23/24 1246

## 2024-01-23 NOTE — Telephone Encounter (Signed)
 Call came straight to triage. Patient stated he had MVR on May 29th at [facility]. He had his post op visit this past Friday with [facility] and everything was fine. Patient state he started having has some Fluttering, mild heart burn, dull burning chest pain, BP  133/83  and HR 106 today. Patient is very concerned. Made patient an appointment to get evaluated today with Dayna Dunn PA. Will send message to Dr. Barbaraann patient's cardiologist and Dayna Dunn PA so they are aware.

## 2024-01-23 NOTE — Telephone Encounter (Signed)
 Pt c/o of Chest Pain: STAT if active (IN THIS MOMENT) CP, including tightness, pressure, jaw pain, shoulder/upper arm/back pain, SOB, nausea, and vomiting.  1. Are you having CP right now (tightness, pressure, or discomfort)? Yes   2. Are you experiencing any other symptoms (ex. SOB, nausea, vomiting, sweating)? No   3. How long have you been experiencing CP? Sunday night   4. Is your CP continuous or coming and going? Continuous   5. Have you taken Nitroglycerin ? No   6. If CP returns before callback, please consider calling 911. ?

## 2024-01-23 NOTE — Discharge Instructions (Signed)
 As we discussed, recommend urodynamics reassuring for acute findings.  Laboratory evaluation, x-ray, EKG, and CT of your chest did not reveal any emergent concerns. I recommend that you call your cardiologist to schedule a follow-up appointment now that we have ruled out the immediate life threats. You should follow-up with your pcp and your surgeon as well. Otherwise, please continue to adhere to the post operative recommendations given to you by your surgeon and avoid heavy lifting or excessive activity beyond what was recommended to you.   Return if development of any new or worsening symptoms

## 2024-01-24 ENCOUNTER — Ambulatory Visit (INDEPENDENT_AMBULATORY_CARE_PROVIDER_SITE_OTHER)

## 2024-01-24 DIAGNOSIS — Z7901 Long term (current) use of anticoagulants: Secondary | ICD-10-CM | POA: Diagnosis not present

## 2024-01-24 LAB — POCT INR: INR: 1.8 — AB (ref 2.0–3.0)

## 2024-01-24 MED ORDER — WARFARIN SODIUM 2 MG PO TABS: ORAL_TABLET | ORAL | 0 refills | Status: DC

## 2024-01-24 NOTE — Progress Notes (Signed)
 Indication; mechanical heart valve Pt in ER yesterday c/o chest pain and palpitations. No abnormalities found in testing. Pt was discharged and advised to f/u with cardiology. Pt denies any chest pain or palpitations today. He will pick up further Lovenox  injections at pharmacy today from script sent in by cardiothoracic.  Continue Lovenox  injections per cardiothoracic provider until therapeutic again. Increase dose today to take 4 1/2 tablets and increase dose tomorrow to take 4 tablets and then change weekly dose to take 3  tablets (6 mg) daily except take 4 tablets (5 mg)on Mondays, Wednesday and Friday. Recheck in 1 week. Pt is compliant with warfarin management and PCP apts.  Sent in refill of warfarin to requested pharmacy.

## 2024-01-24 NOTE — Patient Instructions (Addendum)
 Pre visit review using our clinic review tool, if applicable. No additional management support is needed unless otherwise documented below in the visit note.  Continue Lovenox  injections per cardiothoracic provider until therapeutic again. Increase dose today to take 4 1/2 tablets and increase dose tomorrow to take 4 tablets and then change weekly dose to take 3  tablets (6 mg) daily except take 4 tablets (5 mg)on Mondays, Wednesday and Friday. Recheck in 1 week.

## 2024-01-26 ENCOUNTER — Ambulatory Visit

## 2024-01-31 ENCOUNTER — Ambulatory Visit

## 2024-01-31 DIAGNOSIS — Z7901 Long term (current) use of anticoagulants: Secondary | ICD-10-CM

## 2024-01-31 LAB — POCT INR: INR: 1.7 — AB (ref 2.0–3.0)

## 2024-01-31 NOTE — Progress Notes (Cosign Needed Addendum)
 Indication; mechanical heart valve Pt was d/c with a warfarin script for 2 mg tablets and prescribed to take 2 1/2 daily.  Pt reports starting protein shakes a couple of weeks ago and will continue these. Pt had apt with cardiothoracic surgeon on 6/20. They recommended he restart Lovenox  injections due to subtherapeutic INRs. This was discussed with pt on 6/23 and he agreed to continue injections. **Pt contacted coumadin  clinic reporting his dosing for the next week is the same as last week. On review of the note the dosing did not update as it would normally and last weeks dosing generated. An IT ticket has been entered for this in the past. Will open another IT ticket. Advised pt this nurse will send a mychart msg with updating dosing and if any questions to contact coumadin  clinic. Pt verbalized understanding.  Updated dosing is as follows. Increase dose today to take 4 1/2 tablets (9 mg) and increase dose tomorrow to take 4 1/2 tablets (9 mg) and then change weekly dose to take 4 tablets (8 mg) daily except take 3 tablets (6 mg) on Monday and Thursday. Sent mychart msg to pt with updated dosing.

## 2024-01-31 NOTE — Patient Instructions (Addendum)
 Pre visit review using our clinic review tool, if applicable. No additional management support is needed unless otherwise documented below in the visit note.  Continue Lovenox  injections. Increase dose today to take 4 1/2 tablets and increase dose tomorrow to take 4 tablets and then change weekly dose to take 3  tablets (6 mg) daily except take 4 tablets (8 mg)on Mondays, Wednesday and Friday. Recheck in 1 week.

## 2024-02-07 ENCOUNTER — Ambulatory Visit

## 2024-02-07 DIAGNOSIS — Z7901 Long term (current) use of anticoagulants: Secondary | ICD-10-CM

## 2024-02-07 LAB — POCT INR: INR: 1.8 — AB (ref 2.0–3.0)

## 2024-02-07 NOTE — Patient Instructions (Addendum)
 Pre visit review using our clinic review tool, if applicable. No additional management support is needed unless otherwise documented below in the visit note.  Continue Lovenox  injections. Increase dose today to take 6 tablets and increase dose tomorrow to take 4 1/2 tablets and then change weekly dose to take 4  tablets (8 mg) daily except take 3 tablets (10 mg) on Monday, Wednesday and Friday. Recheck in 1 week.

## 2024-02-07 NOTE — Progress Notes (Signed)
 Indication; mechanical heart valve Pt was d/c with a warfarin script for 2 mg tablets and prescribed to take 2 1/2 daily.  Pt reports starting protein shakes a couple of weeks ago and will continue these. Pt had apt with cardiothoracic surgeon on 6/20. They recommended he restart Lovenox  injections due to subtherapeutic INRs. This was discussed with pt on 6/23 and he agreed to continue injections.  Continue Lovenox  injections. Increase dose today to take 6 tablets and increase dose tomorrow to take 4 1/2 tablets and then change weekly dose to take 4  tablets (8 mg) daily except take 3 tablets (10 mg) on Monday, Wednesday and Friday. Recheck in 1 week.  INR remains steady at 1.7-1.9 with increases in dosing weekly. INR range is 2.5-3.5. Protein intake can effect warfarin and can decrease INR with an increase in warfarin metabolism. An increase of 20% was made to dosing today.

## 2024-02-08 DIAGNOSIS — Z79899 Other long term (current) drug therapy: Secondary | ICD-10-CM | POA: Diagnosis not present

## 2024-02-08 DIAGNOSIS — D84821 Immunodeficiency due to drugs: Secondary | ICD-10-CM | POA: Diagnosis not present

## 2024-02-08 DIAGNOSIS — M0579 Rheumatoid arthritis with rheumatoid factor of multiple sites without organ or systems involvement: Secondary | ICD-10-CM | POA: Diagnosis not present

## 2024-02-14 ENCOUNTER — Ambulatory Visit (INDEPENDENT_AMBULATORY_CARE_PROVIDER_SITE_OTHER)

## 2024-02-14 DIAGNOSIS — Z7901 Long term (current) use of anticoagulants: Secondary | ICD-10-CM

## 2024-02-14 LAB — POCT INR: INR: 2.8 (ref 2.0–3.0)

## 2024-02-14 NOTE — Progress Notes (Addendum)
 Indication; mechanical heart valve Pt was d/c with a warfarin script for 2 mg tablets and prescribed to take 2 1/2 daily.   Pt has been taking protein shakes daily for the last several weeks. With consistent weekly increases in warfarin dose, INR did not increase significantly.  Pt reports he decided last week after INR check that he would stop the protein shakes. Protein binds to warfarin and increases in protein intake can decrease INR.  Pt reports he will not restart shakes. Advised if any other changes to contact the coumadin  clinic.   Pt has been on Lovenox  injections since his cardiothoracic surgeon apt on 6/20, while INR was subtherapeutic. Pt will stop Lovenox  injections today.  INR today is in range but concerns that INR will drop if there is not a small change in dosing. Would like INR closer to goal of 3.0. Pt agreed with this and a small dose increase, 3 mg increase, was made to weekly dosing. Will recheck in one week. Advised pt if any s/s of abnormal bruising or bleeding to go to ER. Pt verbalized understanding.   Stop Lovenox  injections. Change weekly dose to take 5 tablets (10 mg) daily except take 4 tablets (8 mg) on Tuesday and Saturday. Recheck in 1 week.   Will change warfarin tablet dose once INR is stable, since pt is currently using 2 mg tablets that he was d/c from hospital with. This will reduce the number of tablets pt will need to take daily.

## 2024-02-14 NOTE — Patient Instructions (Addendum)
 Pre visit review using our clinic review tool, if applicable. No additional management support is needed unless otherwise documented below in the visit note.  Stop Lovenox  injections. Change weekly dose to take 5  tablets (10 mg) daily except take 4 tablets (8 mg) on Tuesday and Saturday . Recheck in 1 week.

## 2024-02-19 ENCOUNTER — Other Ambulatory Visit: Payer: Self-pay | Admitting: Internal Medicine

## 2024-02-19 DIAGNOSIS — Z7901 Long term (current) use of anticoagulants: Secondary | ICD-10-CM

## 2024-02-21 ENCOUNTER — Ambulatory Visit

## 2024-02-21 DIAGNOSIS — Z7901 Long term (current) use of anticoagulants: Secondary | ICD-10-CM

## 2024-02-21 LAB — POCT INR: INR: 2.8 (ref 2.0–3.0)

## 2024-02-21 MED ORDER — WARFARIN SODIUM 5 MG PO TABS: ORAL_TABLET | ORAL | 1 refills | Status: DC

## 2024-02-21 NOTE — Progress Notes (Addendum)
 Indication; mechanical heart valve Pt reports has stopped the protein shakes. Protein binds to warfarin and increases in protein intake can decrease INR.  Pt reports he will not restart shakes. Advised if he does decide to restart to contact the coumadin  clinic. INR last week was 2.8. Dosing was increased slightly to move INR closer to goal of 3.0 and to prevent INR from becoming subtherapeutic again, as pt had so much difficulty starting warfarin with subtherapeutic INRs for some time.  INR this week is the same 2.8. Will increase dose again by 2% to try to move INR up slightly.  Pt was d/c with a warfarin script for 2 mg tablets and prescribed to take 2 1/2 daily. Pt is now taking 10 and 8 mg daily, and having to take 4 to 5 tablets daily with current dosing.  Changing tablet dose today to 5 mg so pt will not have to take so many tablets daily. Also increased weekly dose by 2% to bring INR closer to goal of 3.0. Advised to watch for s/s of bleeding or abnormal bruising and if any s/s to go to ER. Pt verbalized understanding.  Your tablet dose has change to 5 mg. Pick up new script at your convenience.  New weekly dosing is take 2 tablets (10 mg) daily except take 1 1/2 tablets (7.5 mg) on Saturday. Recheck in 2 weeks.  Sent pt mychart msg with new dosing and instructions. Dosing calendar has been updated. Sent in new script for 5 mg tablet.

## 2024-02-21 NOTE — Patient Instructions (Addendum)
 Pre visit review using our clinic review tool, if applicable. No additional management support is needed unless otherwise documented below in the visit note.  Take 2 tablets (10 mg) daily except take 1 1/2 tablets (7.5 mg) on Saturday. Recheck in 2 weeks.

## 2024-02-22 ENCOUNTER — Encounter: Payer: Self-pay | Admitting: Cardiovascular Disease

## 2024-03-01 ENCOUNTER — Ambulatory Visit (INDEPENDENT_AMBULATORY_CARE_PROVIDER_SITE_OTHER)

## 2024-03-01 DIAGNOSIS — Z7901 Long term (current) use of anticoagulants: Secondary | ICD-10-CM

## 2024-03-01 LAB — POCT INR: INR: 2.2 (ref 2.0–3.0)

## 2024-03-01 NOTE — Patient Instructions (Addendum)
 Pre visit review using our clinic review tool, if applicable. No additional management support is needed unless otherwise documented below in the visit note.  Increase dose today to take 3 tablets (15 mg) and then change weekly dose to take 2 tablets (10 mg) daily except take 2 1/2 tablets on Monday and Thursday. Recheck in 2 weeks.

## 2024-03-01 NOTE — Progress Notes (Cosign Needed Addendum)
 Indication; mechanical heart valve INR two weeks ago was 2.8. Dosing was increased slightly to move INR closer to goal of 3.0 and to prevent INR from becoming subtherapeutic again, as pt had so much difficulty starting warfarin with subtherapeutic INRs for some time.  INR last week was the same 2.8. Dose was increased again by 2% to try to move INR up slightly.   Increase dose today to take 3 tablets (15 mg) and then change weekly dose to take 2 tablets (10 mg) daily except take 2 1/2 tablets on Monday and Thursday. Recheck in 2 weeks.   Medical screening examination/treatment/procedure(s) were performed by non-physician practitioner and as supervising physician I was immediately available for consultation/collaboration.  I agree with above. Karlynn Noel, MD

## 2024-03-06 ENCOUNTER — Ambulatory Visit

## 2024-03-08 DIAGNOSIS — R748 Abnormal levels of other serum enzymes: Secondary | ICD-10-CM | POA: Diagnosis not present

## 2024-03-10 NOTE — Progress Notes (Signed)
 Repeat your test completely normal.  That is reassuring

## 2024-03-13 ENCOUNTER — Ambulatory Visit

## 2024-03-13 DIAGNOSIS — Z7901 Long term (current) use of anticoagulants: Secondary | ICD-10-CM | POA: Diagnosis not present

## 2024-03-13 LAB — POCT INR: INR: 4.4 — AB (ref 2.0–3.0)

## 2024-03-13 NOTE — Progress Notes (Signed)
 Indication; mechanical heart valve Pt reports eating a lot of cherries two days ago. Pt also reports increased alcohol intake 5 days. Reports swelling (edema) in knees due to rheumatoid arthritis. All of these factors can increase INR. Educated pt on these factors. Pt verbalized understanding.

## 2024-03-13 NOTE — Patient Instructions (Addendum)
 Pre visit review using our clinic review tool, if applicable. No additional management support is needed unless otherwise documented below in the visit note.  Hold dose today and then change weekly dose to take 2 tablets (10 mg) daily except take 2 1/2 tablets on Monday. Recheck in 1 weeks.

## 2024-03-20 ENCOUNTER — Ambulatory Visit (INDEPENDENT_AMBULATORY_CARE_PROVIDER_SITE_OTHER)

## 2024-03-20 DIAGNOSIS — Z7901 Long term (current) use of anticoagulants: Secondary | ICD-10-CM | POA: Diagnosis not present

## 2024-03-20 LAB — POCT INR: INR: 3.1 — AB (ref 2.0–3.0)

## 2024-03-20 NOTE — Patient Instructions (Addendum)
 Pre visit review using our clinic review tool, if applicable. No additional management support is needed unless otherwise documented below in the visit note.  Continue 2 tablets (10 mg) daily except take 2 1/2 tablets on Monday. Recheck in 1 weeks.

## 2024-03-20 NOTE — Progress Notes (Addendum)
 Indication; mechanical heart valve Pt reports a broken blood vessel in his right middle finger with some swelling. He denies any pain. Assessment of finger reveals some swelling and redness at the joint but no visible internal bleeding or hematoma. Pt also reports he talked to his RA provider about the swelling in his knees from RA. Provider recommends prednisone x 15 days (15 mg x 5 days, 10 mg x 5 days, 5 mg x 5 days) but advised to inquire with anticoagulation clinic if ok to take. Advised pt it is ok to take but need to check INR around once week after starting. Pt will start today or tomorrow. Scheduled next INR check for one week. Advised to watch for s/s of bleeding and s/s of a clot due to potential interaction to increase or decrease INR. Pt verbalized understanding.  Continue 2 tablets (10 mg) daily except take 2 1/2 tablets on Monday. Recheck in 1 weeks.

## 2024-03-27 ENCOUNTER — Telehealth: Payer: Self-pay

## 2024-03-27 ENCOUNTER — Ambulatory Visit (INDEPENDENT_AMBULATORY_CARE_PROVIDER_SITE_OTHER)

## 2024-03-27 DIAGNOSIS — Z952 Presence of prosthetic heart valve: Secondary | ICD-10-CM

## 2024-03-27 DIAGNOSIS — Z7901 Long term (current) use of anticoagulants: Secondary | ICD-10-CM | POA: Diagnosis not present

## 2024-03-27 LAB — POCT INR: INR: 4.3 — AB (ref 2.0–3.0)

## 2024-03-27 NOTE — Patient Instructions (Addendum)
 Pre visit review using our clinic review tool, if applicable. No additional management support is needed unless otherwise documented below in the visit note.  Hold warfarin today and then change weekly dose to take 2 tablets (10 mg) daily. Recheck in 2 weeks.

## 2024-03-27 NOTE — Progress Notes (Signed)
 Indication; mechanical heart valve Pt also reports he talked to his RA provider about the swelling in his knees from RA. Provider recommended prednisone x 15 days (15 mg x 5 days, 10 mg x 5 days, 5 mg x 5 days). Pt started about 1 week ago.  Advised to watch for s/s of bleeding and s/s of a clot due to potential interaction to increase or decrease INR. Pt denies any bleeding. Pt verbalized understanding.  Hold warfarin today and then change weekly dose to take 2 tablets (10 mg) daily. Recheck in 2 weeks.

## 2024-03-27 NOTE — Telephone Encounter (Signed)
 CXR ordered, patient notified via [facility].

## 2024-04-02 ENCOUNTER — Encounter: Payer: Self-pay | Admitting: *Deleted

## 2024-04-04 ENCOUNTER — Encounter: Payer: Self-pay | Admitting: Emergency Medicine

## 2024-04-04 ENCOUNTER — Ambulatory Visit (HOSPITAL_COMMUNITY)
Admission: RE | Admit: 2024-04-04 | Discharge: 2024-04-04 | Disposition: A | Discharge status: Home / Self Care | Source: Ambulatory Visit | Attending: Cardiology | Admitting: Cardiology

## 2024-04-04 ENCOUNTER — Ambulatory Visit: Attending: Emergency Medicine | Admitting: Emergency Medicine

## 2024-04-04 VITALS — BP 102/70 | HR 78 | Ht 69.0 in | Wt 164.0 lb

## 2024-04-04 DIAGNOSIS — I9789 Other postprocedural complications and disorders of the circulatory system, not elsewhere classified: Secondary | ICD-10-CM

## 2024-04-04 DIAGNOSIS — I4891 Unspecified atrial fibrillation: Secondary | ICD-10-CM

## 2024-04-04 DIAGNOSIS — Z952 Presence of prosthetic heart valve: Secondary | ICD-10-CM | POA: Insufficient documentation

## 2024-04-04 DIAGNOSIS — Z8679 Personal history of other diseases of the circulatory system: Secondary | ICD-10-CM | POA: Diagnosis not present

## 2024-04-04 NOTE — Patient Instructions (Signed)
 Medication Instructions:  NO CHANGES   Lab Work: BMET AND CBC TO BE DONE TODAY.   Testing/Procedures: Your physician has requested that you have an echocardiogram. Echocardiography is a painless test that uses sound waves to create images of your heart. It provides your doctor with information about the size and shape of your heart and how well your heart's chambers and valves are working. This procedure takes approximately one hour. There are no restrictions for this procedure. Please do NOT wear cologne, perfume, aftershave, or lotions (deodorant is allowed). Please arrive 15 minutes prior to your appointment time.  Please note: We ask at that you not bring children with you during ultrasound (echo/ vascular) testing. Due to room size and safety concerns, children are not allowed in the ultrasound rooms during exams. Our front office staff cannot provide observation of children in our lobby area while testing is being conducted. An adult accompanying a patient to their appointment will only be allowed in the ultrasound room at the discretion of the ultrasound technician under special circumstances. We apologize for any inconvenience.   Follow-Up: At [facility], you and your health needs are our priority.  As part of our continuing mission to provide you with exceptional heart care, our providers are all part of one team.  This team includes your primary Cardiologist (physician) and Advanced Practice Providers or APPs (Physician Assistants and Nurse Practitioners) who all work together to provide you with the care you need, when you need it.  Your next appointment:   3 MONTHS  Provider:   Darryle ONEIDA Decent, MD ONLY

## 2024-04-04 NOTE — Progress Notes (Signed)
 Cardiology Office Note:    Date:  04/04/2024  ID:  Perry Lewis, DOB 04-22-1966, MRN [id number] PCP: Perry Bernardino MATSU, MD  Perry Lewis Cardiologist:  Perry ONEIDA Decent, MD       Patient Profile:       Chief Complaint: Follow-up mechanical MVR History of Present Illness:  Perry Lewis is a 58 y.o. male with visit-pertinent history of severe MR s/p MV repair in 2022, RA, hyperlipidemia, postoperative atrial fibrillation  He has history of mitral valve regurgitation with mitral valve prolapse s/p minimally invasive mitral valve repair in 11/2020.  He did develop postop atrial fibrillation with eventual restoration of NSR, not on anticoagulation.  Postop echocardiogram in 01/2021 showed LVEF 55 to 60%, residual SAM with LVOT, more prominent with Valsalva maneuver.  Repeat echocardiogram was recommended in 1 year.  He was seen in office on 04/21/2021 and noted palpitations as well as increased PVCs during cardiac rehab.  7-day ZIO showed predominantly sinus rhythm, 6 runs of SVT, PVCs, no evidence of atrial fibrillation or other significant arrhythmia.  Echocardiogram 05/2023 showed LVEF 55 to 60%, no RWMA, RV function normal, mild to moderate mitral valve regurgitation.  TEE 07/2023 showed mild residual MR noted due to persistent prolapse of the PA MVL, redundant chordal tissue noted under the MV, but no significant LVOT obstruction at rest, moderate MR present, no outflow tract obstruction, LVEF 60 to 65%.  Stress echocardiogram 09/2023 showed increase in MR post exercise, increase this from mild to moderate to severe with no significant LVOT gradient at rest and postexercise.  He was last seen in clinic on 10/04/2023.  He has history of mitral valve repair and is experiencing worsening symptoms including shortness of breath with minimal exertion.  Initially had left ventricular outflow tract obstruction which has improved but the mitral valve regurgitation has worsened.  His  mitral valve repair is incompetent requiring redo Lewis.  He was referred to Perry Lewis for redo mitral valve Lewis consultation.  His metoprolol  was discontinued.  Patient underwent mechanical MVR (27mm On-X mechanical) via sternotomy performed on 12/28/2023 at Perry Lewis.  The patient was discharged home on 01/04/2024.  Last seen by Perry Lewis on 01/19/2024.  Patient was doing well at the time and was to return as needed.   Discussed the use of AI scribe software for clinical note transcription with the patient, who gave verbal consent to proceed.  History of Present Illness Perry Lewis is a 58 year old male with a history of mitral valve repair and recent mechanical valve replacement who presents for follow-up after Lewis.  Today patient is doing well overall.  He is without acute cardiovascular concerns or complaints.  Denies any chest pains or dyspnea.  He has returned to physical exercise,  engaging in treadmill for 30 minutes and resistance training for 45 minutes while avoiding chest exercises until his sternum heals.  He is on metoprolol  tartrate, 75 mg twice daily, initiated post-Lewis for a high resting heart rate. He experiences no significant side effects and finds it beneficial for stress management. He is considering reducing the dose to 50 mg twice daily.  He takes Coumadin  and works with a Coumadin  clinic to manage variable INR levels. Dietary adjustments include romaine salads and broccoli, and he has discontinued protein shakes due to their impact on INR.  Symptoms of lightheadedness and dizziness upon standing have resolved following the valve replacement. Post-Lewis, he experienced fluid retention with a 20-pound weight gain, which was  managed with Lasix , followed by an additional 12-pound weight loss from his pre-Lewis weight.  He is without any orthopnea, PND, lower extremity swelling, syncope, presyncope  Review of systems:  Please see the history of  present illness. All other systems are reviewed and otherwise negative.      Studies Reviewed:    EKG Interpretation Date/Time:  Thursday April 04 2024 13:19:14 EDT Ventricular Rate:  78 PR Interval:  168 QRS Duration:  84 QT Interval:  378 QTC Calculation: 430 R Axis:   2  Text Interpretation: Normal sinus rhythm Normal ECG When compared with ECG of 23-Jan-2024 12:30, Borderline criteria for Inferior infarct are no longer Present Confirmed by Perry Lewis [phone]) on 04/04/2024 5:36:37 PM    Stress echocardiogram 09/12/2023  1. Increase in MR post-exercise; increases from mild to moderate-severe.   2. MV mean gradient 4 mmHg at rest 79bpm and 6 mmHg HR 86bpm  post-exercise.   3. No significant LVOT gradient at rest and post-exercise.   4. TR is mild at rest with no significant change with post-exercise.   5. This is a negative stress echocardiogram for ischemia.   6. This is a low risk study.   TEE 07/31/2023  1. 34 mm annuloplasty ring 12/15/2020. Mild residual MR noted due to  persistent prolapse of the PMVL. Redundant chordal tissue noted under the  MV, but no significant LVOT obstruction at rest. HR increased to 90s with  moderate MR present, but no outflow  tract obstruction. Systolic dominant PV flow was present in all pulmonary  veins at rest and during tachycardia induction. Recommend a stress  echocardiogram to determine MR severity at peak exercise. The mitral valve  has been repaired/replaced. Moderate  mitral valve regurgitation. No evidence of mitral stenosis. There is a 34  prosthetic annuloplasty ring present in the mitral position. Procedure  Date: 12/15/2020.   2. Left ventricular ejection fraction, by estimation, is 60 to 65%. The  left ventricle has normal function.   3. Right ventricular systolic function is normal. The right ventricular  size is normal.   4. Left atrial size was mildly dilated. No left atrial/left atrial  appendage thrombus was  detected.   5. The aortic valve is tricuspid. Aortic valve regurgitation is trivial.  No aortic stenosis is present.   Echocardiogram 05/17/2023 1. Left ventricular ejection fraction, by estimation, is 55 to 60%. The  left ventricle has normal function. The left ventricle has no regional  wall motion abnormalities. There is mild concentric left ventricular  hypertrophy. Left ventricular diastolic  function could not be evaluated. The average left ventricular global  longitudinal strain is -18.8 %. The global longitudinal strain is normal.   2. Right ventricular systolic function is normal. The right ventricular  size is moderately enlarged.   3. Left atrial size was moderately dilated.   4. Status post 34 mm medtronic simuform ring. The bioprosthetic mitral  valve appears to be thickened and the posterior cord is mildly tethered  suspect due to thickening of the neochord. The mitral valve has been  repaired/replaced. Mild to moderate  mitral valve regurgitation. No evidence of mitral stenosis. There is a 34  present in the mitral position.   5. Tricuspid valve regurgitation is mild to moderate.   6. The aortic valve is tricuspid. Aortic valve regurgitation is not  visualized. Aortic valve sclerosis/calcification is present, without any  evidence of aortic stenosis.   7. The inferior vena cava is normal in size with greater than  50%  respiratory variability, suggesting right atrial pressure of 3 mmHg.   Risk Assessment/Calculations:              Physical Exam:   VS:  BP 102/70 (BP Location: Left Arm, Patient Position: Sitting, Cuff Size: Normal)   Pulse 78   Ht 5' 9 (1.753 m)   Wt 164 lb (74.4 kg)   BMI 24.22 kg/m    Wt Readings from Last 3 Encounters:  04/04/24 164 lb (74.4 kg)  01/23/24 160 lb (72.6 kg)  01/16/24 160 lb 9.6 oz (72.8 kg)    GEN: Well nourished, well developed in no acute distress NECK: No JVD; No carotid bruits CARDIAC: RRR.  Mechanical  click RESPIRATORY:  Clear to auscultation without rales, wheezing or rhonchi  ABDOMEN: Soft, non-tender, non-distended EXTREMITIES:  No edema; No acute deformity      Assessment and Plan:  Severe mitral regurgitation S/p complex MVR with 34 mm annuloplasty ring on 12/15/2020 S/p failed MV repair with TEE 07/2023 showing moderate MR and echocardiogram stress test on 09/2023 showing increase in MR post exercise, increase from mild to moderate-severe Now s/p mechanical MVR (27mm On-X mechanical) via sternotomy performed on 12/28/2023 at [facility] - Patient reports vast improvement in symptoms.  No exertional dyspnea or chest pains.  Has resumed physical activity with aerobic exercise and resistance training without limitation or dyspnea - To follow-up with Perry Lewis as needed - Currently pending chest x-ray completed today - Plan for echocardiogram x3 months to evaluate mMVR - Continue aspirin  81 mg daily and metoprolol  tartrate 50 mg twice daily - Continue Coumadin  per Coumadin  clinic - CBC and BMET today - SBE prophylaxis for dental work  Postop atrial fibrillation Postop A-fib during MVR on 11/2020 7-day ZIO on 05/2021 showed predominantly sinus rhythm, 6 runs of SVT, PVCs, no evidence of atrial fibrillation or other significant arrhythmia - EKG today shows patient is maintaining sinus rhythm - Today patient denies any symptoms concerning for recurrent atrial fibrillation - No episodes of atrial fibrillation during recent mechanical MVR with Perry - Stable.  Continue clinically monitor    Cardiac Rehabilitation Eligibility Assessment  The patient is ready to start cardiac rehabilitation from a cardiac standpoint.     Dispo:  Return in about 3 months (around 07/04/2024).  Signed, Lum LITTIE Louis, NP

## 2024-04-10 ENCOUNTER — Telehealth (HOSPITAL_COMMUNITY): Payer: Self-pay

## 2024-04-10 ENCOUNTER — Ambulatory Visit (INDEPENDENT_AMBULATORY_CARE_PROVIDER_SITE_OTHER)

## 2024-04-10 DIAGNOSIS — Z7901 Long term (current) use of anticoagulants: Secondary | ICD-10-CM

## 2024-04-10 LAB — POCT INR: INR: 5.1 — AB (ref 2.0–3.0)

## 2024-04-10 NOTE — Progress Notes (Signed)
 Indication; mechanical heart valve  Pt reports a couple of beers both days this weekend. He does not normally consume alcohol during the week. Denies any changes. Advised if any s/s of bleeding or abnormal bruising to go to ER. Pt denies any s/s currently.  Hold warfarin today and reduce dose tomorrow to take 1 tablet and then change weekly dose to take 2 tablets (10 mg) daily except take 1 1/2 tablets on Monday, Wednesday and Friday. Recheck in 2 weeks at [place].

## 2024-04-10 NOTE — Telephone Encounter (Signed)
 Pt insurance is active and benefits verified through [employer]. Co-pay $0, DED $3,300/$3,300 met, out of pocket $4,500/$4,500 met, co-insurance 20%. No pre-authorization required. Passport, 04/10/2024 @ 1:31pm, REF# [id number].  TCR/ICR? ICR Visit(date of service)limitation? 36 Can multiple codes be used on the same date of service/visit?(IF ITS A LIMIT) Yes  Is this a lifetime maximum or an annual maximum? Annual Has the member used any of these services to date? No Is there a time limit (weeks/months) on start of program and/or program completion? No

## 2024-04-10 NOTE — Telephone Encounter (Signed)
 Called patient to schedule cardiac rehab. Patient states he is not interested in rehab at this time, but asked that we mail him a brochure. He also stated he may want to come in and tour the facility. Informed patient we will close his referral for now and he can call us  to reopen it when he decides he wants to come.  Closing referral.

## 2024-04-10 NOTE — Patient Instructions (Addendum)
 Pre visit review using our clinic review tool, if applicable. No additional management support is needed unless otherwise documented below in the visit note.  Hold warfarin today and reduce dose tomorrow to take 1 tablet and then change weekly dose to take 2 tablets (10 mg) daily except take 1 1/2 tablets on Monday, Wednesday and Friday.. Recheck in 2 weeks at [facility] at [place].

## 2024-04-14 ENCOUNTER — Ambulatory Visit: Payer: Self-pay | Admitting: Cardiovascular Disease

## 2024-04-19 DIAGNOSIS — M721 Knuckle pads: Secondary | ICD-10-CM | POA: Diagnosis not present

## 2024-04-19 DIAGNOSIS — C44319 Basal cell carcinoma of skin of other parts of face: Secondary | ICD-10-CM | POA: Diagnosis not present

## 2024-04-19 DIAGNOSIS — D485 Neoplasm of uncertain behavior of skin: Secondary | ICD-10-CM | POA: Diagnosis not present

## 2024-04-19 DIAGNOSIS — L218 Other seborrheic dermatitis: Secondary | ICD-10-CM | POA: Diagnosis not present

## 2024-04-19 DIAGNOSIS — L821 Other seborrheic keratosis: Secondary | ICD-10-CM | POA: Diagnosis not present

## 2024-04-19 DIAGNOSIS — L814 Other melanin hyperpigmentation: Secondary | ICD-10-CM | POA: Diagnosis not present

## 2024-04-24 ENCOUNTER — Ambulatory Visit (INDEPENDENT_AMBULATORY_CARE_PROVIDER_SITE_OTHER)

## 2024-04-24 DIAGNOSIS — Z7901 Long term (current) use of anticoagulants: Secondary | ICD-10-CM

## 2024-04-24 LAB — POCT INR: INR: 2.5 (ref 2.0–3.0)

## 2024-04-24 NOTE — Progress Notes (Signed)
 Indication; mechanical heart valve   Pt denies any changes. Continue 2 tablets (10 mg) daily except take 1 1/2 tablets on Monday, Wednesday and Friday. Recheck in 2 weeks at [place].

## 2024-04-24 NOTE — Patient Instructions (Addendum)
 Pre visit review using our clinic review tool, if applicable. No additional management support is needed unless otherwise documented below in the visit note.  Continue 2 tablets (10 mg) daily except take 1 1/2 tablets on Monday, Wednesday and Friday. Recheck in 2 weeks.

## 2024-05-08 ENCOUNTER — Ambulatory Visit

## 2024-05-08 DIAGNOSIS — Z7901 Long term (current) use of anticoagulants: Secondary | ICD-10-CM

## 2024-05-08 LAB — POCT INR: INR: 4 — AB (ref 2.0–3.0)

## 2024-05-08 NOTE — Patient Instructions (Addendum)
 Pre visit review using our clinic review tool, if applicable. No additional management support is needed unless otherwise documented below in the visit note.  Hold warfarin today and then change weekly dose to take 1 1/2 tablets (10 mg) daily except take 2 tablets on Tuesday, Thursday and Saturday. Recheck in 2 weeks at [place].

## 2024-05-08 NOTE — Progress Notes (Addendum)
 Indication; mechanical heart valve   Pt reports a lump with bruising on R forearm appeared last night after going to the gym. He believes it is just a broken blood vessel. Assessment reveals a hard lump the size of a quarter. No warmth, redness or swelling in the arm. Pt denies any pain. Advised pt he should have it assessed for a potential clot. He reports he will have it assessed and may go to a local UC this evening. Pt reports alcohol this weekend, but just a few beers. Pt denies any other changes.  Hold warfarin today and then change weekly dose to take 1 1/2 tablets (10 mg) daily except take 2 tablets on Tuesday, Thursday and Saturday. Recheck in 2 weeks at [place].

## 2024-05-09 ENCOUNTER — Ambulatory Visit (HOSPITAL_COMMUNITY)
Admission: RE | Admit: 2024-05-09 | Discharge: 2024-05-09 | Disposition: A | Discharge status: Home / Self Care | Source: Ambulatory Visit | Attending: Cardiology | Admitting: Cardiology

## 2024-05-09 ENCOUNTER — Encounter (HOSPITAL_COMMUNITY): Payer: Self-pay

## 2024-05-09 ENCOUNTER — Ambulatory Visit (HOSPITAL_COMMUNITY): Admission: EM | Admit: 2024-05-09 | Discharge: 2024-05-09 | Disposition: A | Discharge status: Home / Self Care

## 2024-05-09 DIAGNOSIS — R233 Spontaneous ecchymoses: Secondary | ICD-10-CM | POA: Diagnosis not present

## 2024-05-09 DIAGNOSIS — Z952 Presence of prosthetic heart valve: Secondary | ICD-10-CM | POA: Insufficient documentation

## 2024-05-09 LAB — ECHOCARDIOGRAM COMPLETE
Area-P 1/2: 4.26 cm2
MV VTI: 1.98 cm2
S' Lateral: 2.4 cm

## 2024-05-09 NOTE — Discharge Instructions (Addendum)
 Symptoms and physical exam findings most consistent with a spontaneous hematoma of the right forearm.  Do not suspect any type of deep vein thrombosis.  Recommend using moist heat on the area to help with resolution.  If you begin to notice multiple spontaneous hematomas then recommend following up with your cardiologist to discuss your anticoagulation.  Can return to urgent care as needed

## 2024-05-09 NOTE — ED Triage Notes (Signed)
 Patient presents with a right bruise on his right forearm. Patient was lifting weight yesterday when he notice the bruise. Denies any pain at this time.

## 2024-05-09 NOTE — ED Provider Notes (Signed)
 [facility]    CSN: [id number] Arrival date & time: 05/09/24  1718      History   Chief Complaint Chief Complaint  Patient presents with   Wound Check    HPI Perry Lewis is a 58 y.o. male.   58 year old male presents urgent care with complaints of a bruise with a lump on the inside aspect of his right forearm.  He reports he was lifting weights yesterday and noticed the area.  Today the bruising has spread out.  He denies any pain, swelling, numbness, tingling.  He is on Coumadin  after a mitral valve replacement.  He did see his Coumadin  clinic yesterday and they recommended getting it evaluated.  He denies any history of deep vein thrombosis.  He denies any other associated symptoms.  He does not believe that he has injured the area.   Wound Check Pertinent negatives include no chest pain, no abdominal pain and no shortness of breath.    Past Medical History:  Diagnosis Date   Acute appendicitis s/p lap appendectomy 11/28/2018 11/28/2018   Heart murmur 2015   mitral valve repr 11/2020   Mitral regurgitation    Mitral valve prolapse    Rheumatoid arthritis (HCC)    S/P minimally invasive mitral valve repair 12/15/2020   Complex valvuloplasty including artificial Gore-tex neochord placement x10 with 34 mm Medtronic Simuform ring annuloplasty via right mini thoracotomy approach    Patient Active Problem List   Diagnosis Date Noted   Anticoagulated on Coumadin  01/16/2024   History of mitral valve replacement with mechanical valve 01/16/2024   Anemia 08/09/2022   Allergic rhinitis 07/14/2022   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   Mitral regurgitation    Rheumatoid arthritis (HCC)    Rheumatoid arthritis involving multiple sites with positive rheumatoid factor (HCC) 06/05/2019   Seasonal allergies 04/17/2019   Varicose veins of right lower extremity 04/17/2019   Hypertriglyceridemia 01/08/2019    Mitral valve insufficiency 01/08/2019   Mitral valve prolapse 01/08/2019   Allergic conjunctivitis 09/26/2013   Borderline blood pressure 09/26/2013   Heart murmur 09/26/2013    Past Surgical History:  Procedure Laterality Date   APPENDECTOMY  2020   surgery 11/2018   BUBBLE STUDY  10/26/2020   Procedure: BUBBLE STUDY;  Surgeon: Barbaraann Darryle Ned, MD;  [facility] END[facility]COPY;  Service: Cardiovascular;;   EYE SURGERY  1995   radial keratotomy   HERNIA REPAIR     LAPAROSCOPIC APPENDECTOMY N/A 11/27/2018   Procedure: APPENDECTOMY LAPAROSCOPIC;  Surgeon: Tanda Locus, MD;  Location: [facility] ORS;  Service: General;  Laterality: N/A;   MITRAL VALVE REPAIR Right 12/15/2020   Procedure: MINIMALLY INVASIVE MITRAL VALVE REPAIR (MVR);  Surgeon: Dusty Sudie DEL, MD;  Location: [facility] OR;  Service: Open Heart Surgery;  Laterality: Right;   MITRAL VALVE REPLACEMENT N/A 11/2023   TEE WITHOUT CARDIOVERSION N/A 10/26/2020   Procedure: TRANSESOPHAGEAL ECHOCARDIOGRAM (TEE);  Surgeon: Barbaraann Darryle Ned, MD;  Location: [facility] ENDOSCOPY;  Service: Cardiovascular;  Laterality: N/A;   TEE WITHOUT CARDIOVERSION N/A 12/15/2020   Procedure: TRANSESOPHAGEAL ECHOCARDIOGRAM (TEE);  Surgeon: Dusty Sudie DEL, MD;  Location: [facility] OR;  Service: Open Heart Surgery;  Laterality: N/A;   TRANSESOPHAGEAL ECHOCARDIOGRAM (CATH LAB) N/A 07/31/2023   Procedure: TRANSESOPHAGEAL ECHOCARDIOGRAM;  Surgeon: Barbaraann Darryle Ned, MD;  Location: [facility] INVASIVE CV LAB;  Service: Cardiovascular;  Laterality: N/A;       Home Medications    Prior to Admission  medications   Medication Sig Start Date End Date Taking? Authorizing Provider  aspirin  EC 81 MG EC tablet Take 1 tablet (81 mg total) by mouth daily. Swallow whole. 12/19/20  Yes Roddenberry, Myron G, PA-C  methotrexate (RHEUMATREX) 2.5 MG tablet Take 15 mg by mouth every Tuesday. Caution:Chemotherapy. Protect from light.   Yes [provider]  Multiple Vitamins-Minerals  (MULTIVITAMIN WITH MINERALS) tablet Take 1 tablet by mouth daily.   Yes [provider]  warfarin (COUMADIN ) 5 MG tablet TAKE 2 TABLETS (10 MG) BY MOUTH DAILY EXCEPT TAKE 1 1/2 TABLETS (7.5 MG) ON SATURDAYS OR AS DIRECTED BY ANTICOAGULATION CLINIC 02/21/24  Yes Jesus Bernardino MATSU, MD  amoxicillin  (AMOXIL ) 500 MG capsule Take 1,000 mg by mouth 2 (two) times daily. 12/07/23   [provider]  folic acid  (FOLVITE ) 1 MG tablet Take 2 mg by mouth daily.    [provider]  metoprolol  tartrate (LOPRESSOR ) 50 MG tablet Take 50 mg by mouth 2 (two) times daily. Plus half 12/30/23 01/03/25  [provider]    Family History Family History  Problem Relation Age of Onset   Depression Mother    Early death Mother    Varicose Veins Mother    Alcohol abuse Maternal Grandfather    Alcohol abuse Maternal Grandmother     Social History Social History   Tobacco Use   Smoking status: Former    Current packs/day: 0.00    Average packs/day: 1 pack/day for 7.0 years (7.0 ttl pk-yrs)    Types: Cigarettes    Start date: 08/01/1981    Quit date: 08/01/1988    Years since quitting: 35.7   Smokeless tobacco: Never  Vaping Use   Vaping status: Never Used  Substance Use Topics   Alcohol use: Yes    Alcohol/week: 6.0 standard drinks of alcohol    Types: 6 Cans of beer per week    Comment: 2x week   Drug use: Not Currently    Types: Marijuana     Allergies   Patient has no known allergies.   Review of Systems Review of Systems  Constitutional:  Negative for chills and fever.  HENT:  Negative for ear pain and sore throat.   Eyes:  Negative for pain and visual disturbance.  Respiratory:  Negative for cough and shortness of breath.   Cardiovascular:  Negative for chest pain and palpitations.  Gastrointestinal:  Negative for abdominal pain and vomiting.  Genitourinary:  Negative for dysuria and hematuria.  Musculoskeletal:  Negative for arthralgias and back pain.  Skin:   Positive for color change. Negative for rash.  Neurological:  Negative for seizures and syncope.  All other systems reviewed and are negative.    Physical Exam Triage Vital Signs ED Triage Vitals  Encounter Vitals Group     BP 05/09/24 1748 (!) 158/100     Girls Systolic BP Percentile --      Girls Diastolic BP Percentile --      Boys Systolic BP Percentile --      Boys Diastolic BP Percentile --      Pulse Rate 05/09/24 1748 82     Resp 05/09/24 1748 20     Temp 05/09/24 1749 98.1 F (36.7 C)     Temp Source 05/09/24 1749 Oral     SpO2 05/09/24 1748 96 %     Weight --      Height --      Head Circumference --      Peak  Flow --      Pain Score --      Pain Loc --      Pain Education --      Exclude from Growth Chart --    No data found.  Updated Vital Signs BP (!) 158/100 (BP Location: Left Arm)   Pulse 82   Temp 98.1 F (36.7 C) (Oral)   Resp 20   SpO2 96%   Visual Acuity Right Eye Distance:   Left Eye Distance:   Bilateral Distance:    Right Eye Near:   Left Eye Near:    Bilateral Near:     Physical Exam Vitals and nursing note reviewed.  Constitutional:      General: He is not in acute distress.    Appearance: He is well-developed.  HENT:     Head: Normocephalic and atraumatic.  Eyes:     Conjunctiva/sclera: Conjunctivae normal.  Cardiovascular:     Rate and Rhythm: Normal rate and regular rhythm.     Heart sounds: No murmur heard.    Comments: MV click from artificial valve Pulmonary:     Effort: Pulmonary effort is normal. No respiratory distress.     Breath sounds: Normal breath sounds.  Abdominal:     Palpations: Abdomen is soft.     Tenderness: There is no abdominal tenderness.  Musculoskeletal:        General: No swelling.     Cervical back: Neck supple.  Skin:    General: Skin is warm and dry.     Capillary Refill: Capillary refill takes less than 2 seconds.      Neurological:     Mental Status: He is alert.  Psychiatric:         Mood and Affect: Mood normal.      UC Treatments / Results  Labs (all labs ordered are listed, but only abnormal results are displayed) Labs Reviewed - No data to display  EKG   Radiology ECHOCARDIOGRAM COMPLETE Result Date: 05/09/2024    ECHOCARDIOGRAM REPORT   Patient Name:   Makail Watling Date of Exam: 05/09/2024 Medical Rec #:  [id number]          Height:       69.0 in Accession #:    [id number]         Weight:       164.0 lb Date of Birth:  03/29/1966         BSA:          1.899 m Patient Age:    57 years           BP:           130/75 mmHg Patient Gender: M                  HR:           83 bpm. Exam Location:  [address] Procedure: 2D Echo, 3D Echo, Cardiac Doppler and Color Doppler (Both Spectral            and Color Flow Doppler were utilized during procedure). Indications:    Z95.2 Status post TAVR  History:        Patient has prior history of Echocardiogram examinations, most                 recent 05/17/2023. Prior Cardiac Surgery, Mitral Valve Prolapse                 and Mitral Valve Disease,  Signs/Symptoms:Murmur; Risk                 Factors:Former Smoker and Dyslipidemia. History of Mitral Valve                 Prolapse with severe Mitral Insufficiency. Status post failed                 Mitral Valve Repair (2022) with Replacement (12-28-23, 27mm On-X                 Mitral Replacement).  Sonographer:    Heather Hawks RDCS Referring Phys: MADISON L [place] IMPRESSIONS  1. Left ventricular ejection fraction, by estimation, is 60 to 65%. The left ventricle has normal function. The left ventricle has no regional wall motion abnormalities.  2. Right ventricular systolic function is normal. The right ventricular size is mildly enlarged.  3. Left atrial size was mildly dilated.  4. S/p 37 mm On-X mechanical mitral valve on 11/2023. Well seated with normal leaflet motion and MG 5.00 mg at HR 79. Unable to assess for regurgitation due to shadowing. There is a subvalvular mobile  echodensity in the LV, likely representing ruptured chord from surgery. . The mitral valve is normal in structure.  5. The aortic valve is normal in structure. Aortic valve regurgitation is not visualized. No aortic stenosis is present.  6. The inferior vena cava is normal in size with <50% respiratory variability, suggesting right atrial pressure of 8 mmHg. FINDINGS  Left Ventricle: Left ventricular ejection fraction, by estimation, is 60 to 65%. The left ventricle has normal function. The left ventricle has no regional wall motion abnormalities. The left ventricular internal cavity size was normal in size. There is  no left ventricular hypertrophy. Abnormal (paradoxical) septal motion consistent with post-operative status. Left ventricular diastolic function could not be evaluated due to mitral valve replacement. Right Ventricle: The right ventricular size is mildly enlarged. No increase in right ventricular wall thickness. Right ventricular systolic function is normal. Left Atrium: Left atrial size was mildly dilated. Right Atrium: Right atrial size was normal in size. Pericardium: There is no evidence of pericardial effusion. Mitral Valve: S/p 37 mm On-X mechanical mitral valve on 11/2023. Well seated with normal leaflet motion and MG 5.00 mg at HR 79. Unable to assess for regurgitation due to shadowing. There is a subvalvular mobile echodensity in the LV, likely representing ruptured chord from surgery. The mitral valve is normal in structure. There is a 27 mm mechanical valve present in the mitral position. MV peak gradient, 14.0 mmHg. The mean mitral valve gradient is 5.0 mmHg. Tricuspid Valve: The tricuspid valve is normal in structure. Tricuspid valve regurgitation is mild . No evidence of tricuspid stenosis. Aortic Valve: The aortic valve is normal in structure. Aortic valve regurgitation is not visualized. No aortic stenosis is present. Pulmonic Valve: The pulmonic valve was not well visualized. Pulmonic  valve regurgitation is trivial. No evidence of pulmonic stenosis. Aorta: The aortic root is normal in size and structure. Venous: The inferior vena cava is normal in size with less than 50% respiratory variability, suggesting right atrial pressure of 8 mmHg. IAS/Shunts: No atrial level shunt detected by color flow Doppler. Additional Comments: 3D was performed not requiring image post processing on an independent workstation and was normal.  LEFT VENTRICLE PLAX 2D LVIDd:         4.90 cm   Diastology LVIDs:         2.40 cm   LV e'  medial:    7.94 cm/s LV PW:         1.00 cm   LV E/e' medial:  19.8 LV IVS:        0.90 cm   LV e' lateral:   9.90 cm/s LVOT diam:     2.10 cm   LV E/e' lateral: 15.9 LV SV:         76 LV SV Index:   40 LVOT Area:     3.46 cm LV IVRT:       70 msec                          3D Volume EF:                          3D EF:        69 %                          LV EDV:       114 ml                          LV ESV:       36 ml                          LV SV:        79 ml RIGHT VENTRICLE RV Basal diam:  4.70 cm RV Mid diam:    3.10 cm RV S prime:     10.59 cm/s TAPSE (M-mode): 1.8 cm RVSP:           28.2 mmHg LEFT ATRIUM             Index        RIGHT ATRIUM           Index LA diam:        4.60 cm 2.42 cm/m   RA Pressure: 3.00 mmHg LA Vol (A2C):   75.0 ml 39.50 ml/m  RA Area:     18.20 cm LA Vol (A4C):   54.0 ml 28.44 ml/m  RA Volume:   53.10 ml  27.96 ml/m LA Biplane Vol: 64.2 ml 33.81 ml/m  AORTIC VALVE LVOT Vmax:   114.00 cm/s LVOT Vmean:  75.650 cm/s LVOT VTI:    0.220 m  AORTA Ao Root diam: 3.30 cm Ao Asc diam:  3.35 cm MITRAL VALVE                TRICUSPID VALVE MV Area (PHT): cm          TR Peak grad:   25.2 mmHg MV Area VTI:   1.98 cm     TR Vmax:        251.00 cm/s MV Peak grad:  14.0 mmHg    Estimated RAP:  3.00 mmHg MV Mean grad:  5.0 mmHg     RVSP:           28.2 mmHg MV Vmax:       1.87 m/s MV Vmean:      97.7 cm/s    SHUNTS MV Decel Time: 178 msec     Systemic VTI:  0.22 m MV  E velocity: 157.00 cm/s  Systemic Diam: 2.10 cm MV A velocity: 119.50 cm/s MV E/A ratio:  1.31 [employer] Electronically signed by  Franck Azobou Tonleu Signature Date/Time: 05/09/2024/5:40:21 PM    Final     Procedures Procedures (including critical care time)  Medications Ordered in UC Medications - No data to display  Initial Impression / Assessment and Plan / UC Course  I have reviewed the triage vital signs and the nursing notes.  Pertinent labs & imaging results that were available during my care of the patient were reviewed by me and considered in my medical decision making (see chart for details).     Spontaneous hematoma of forearm   Symptoms and physical exam findings most consistent with a spontaneous hematoma of the right forearm.  Do not suspect any type of deep vein thrombosis.  Recommend using moist heat on the area to help with resolution.  If you begin to notice multiple spontaneous hematomas then recommend following up with your cardiologist to discuss your anticoagulation.  Can return to urgent care as needed  Final Clinical Impressions(s) / UC Diagnoses   Final diagnoses:  Spontaneous hematoma of forearm     Discharge Instructions      Symptoms and physical exam findings most consistent with a spontaneous hematoma of the right forearm.  Do not suspect any type of deep vein thrombosis.  Recommend using moist heat on the area to help with resolution.  If you begin to notice multiple spontaneous hematomas then recommend following up with your cardiologist to discuss your anticoagulation.  Can return to urgent care as needed    ED Prescriptions   None    PDMP not reviewed this encounter.   Teresa Almarie LABOR, [place] 05/09/24 1813

## 2024-05-10 ENCOUNTER — Encounter: Payer: Self-pay | Admitting: Internal Medicine

## 2024-05-14 ENCOUNTER — Ambulatory Visit: Payer: Self-pay | Admitting: Emergency Medicine

## 2024-05-22 ENCOUNTER — Ambulatory Visit

## 2024-05-24 ENCOUNTER — Ambulatory Visit

## 2024-05-24 DIAGNOSIS — Z7901 Long term (current) use of anticoagulants: Secondary | ICD-10-CM

## 2024-05-24 LAB — POCT INR: INR: 3.4 — AB (ref 2.0–3.0)

## 2024-05-24 NOTE — Patient Instructions (Addendum)
 Pre visit review using our clinic review tool, if applicable. No additional management support is needed unless otherwise documented below in the visit note.  Continue 1 1/2 tablets (10 mg) daily except take 2 tablets on Tuesday, Thursday and Saturday. Recheck in 4 weeks at [place].

## 2024-05-24 NOTE — Progress Notes (Signed)
 Indication; mechanical heart valve   Pt was seen in UC for a reported lump with bruising on R forearm at his last coumadin  clinic apt. Assessment at the UC ruled out a clot and diagnosed it as a spontaneous hematoma. Pt reports he has a dermatology apt on Monday and they requested an INR today. Reports they will be looking at a spot on his face and may remove specimen. Continue 1 1/2 tablets (10 mg) daily except take 2 tablets on Tuesday, Thursday and Saturday. Recheck in 4 weeks at [place].

## 2024-05-27 DIAGNOSIS — C44319 Basal cell carcinoma of skin of other parts of face: Secondary | ICD-10-CM | POA: Diagnosis not present

## 2024-06-19 ENCOUNTER — Ambulatory Visit (INDEPENDENT_AMBULATORY_CARE_PROVIDER_SITE_OTHER)

## 2024-06-19 DIAGNOSIS — D84821 Immunodeficiency due to drugs: Secondary | ICD-10-CM | POA: Diagnosis not present

## 2024-06-19 DIAGNOSIS — Z79899 Other long term (current) drug therapy: Secondary | ICD-10-CM | POA: Diagnosis not present

## 2024-06-19 DIAGNOSIS — Z7901 Long term (current) use of anticoagulants: Secondary | ICD-10-CM

## 2024-06-19 DIAGNOSIS — M0579 Rheumatoid arthritis with rheumatoid factor of multiple sites without organ or systems involvement: Secondary | ICD-10-CM | POA: Diagnosis not present

## 2024-06-19 DIAGNOSIS — M79641 Pain in right hand: Secondary | ICD-10-CM | POA: Diagnosis not present

## 2024-06-19 LAB — POCT INR: INR: 3.2 — AB (ref 2.0–3.0)

## 2024-06-19 NOTE — Patient Instructions (Addendum)
 Pre visit review using our clinic review tool, if applicable. No additional management support is needed unless otherwise documented below in the visit note.  Continue 1 1/2 tablets (10 mg) daily except take 2 tablets on Tuesday, Thursday and Saturday. Recheck in 4 weeks at [place].

## 2024-06-19 NOTE — Progress Notes (Signed)
 Indication; mechanical heart valve   Pt had skin cancer removed via Mohs surgery. Warfarin was not held but INR was checked for surgeon. Incision has almost completely healed already. Pt denies any problems.  Continue 1 1/2 tablets (10 mg) daily except take 2 tablets on Tuesday, Thursday and Saturday. Recheck in 4 weeks at [place].

## 2024-07-03 DIAGNOSIS — I480 Paroxysmal atrial fibrillation: Secondary | ICD-10-CM | POA: Diagnosis not present

## 2024-07-03 DIAGNOSIS — Z952 Presence of prosthetic heart valve: Secondary | ICD-10-CM | POA: Diagnosis not present

## 2024-07-03 DIAGNOSIS — R0602 Shortness of breath: Secondary | ICD-10-CM | POA: Diagnosis not present

## 2024-07-03 DIAGNOSIS — R002 Palpitations: Secondary | ICD-10-CM | POA: Diagnosis not present

## 2024-07-04 LAB — BASIC METABOLIC PANEL WITH GFR
BUN/Creatinine Ratio: 16 (ref 9–20)
BUN: 13 mg/dL (ref 6–24)
CO2: 26 mmol/L (ref 20–29)
Calcium: 10.4 mg/dL — AB (ref 8.7–10.2)
Chloride: 98 mmol/L (ref 96–106)
Creatinine, Ser: 0.79 mg/dL (ref 0.76–1.27)
Glucose: 98 mg/dL (ref 70–99)
Potassium: 4.6 mmol/L (ref 3.5–5.2)
Sodium: 139 mmol/L (ref 134–144)
eGFR: 102 mL/min/1.73 (ref >59)

## 2024-07-04 LAB — CBC
Hematocrit: 41.3 % (ref 37.5–51.0)
Hemoglobin: 13.7 g/dL (ref 13.0–17.7)
MCH: 32 pg (ref 26.6–33.0)
MCHC: 33.2 g/dL (ref 31.5–35.7)
MCV: 97 fL (ref 79–97)
Platelets: 347 x10E3/uL (ref 150–450)
RBC: 4.28 x10E6/uL (ref 4.14–5.80)
RDW: 13.3 % (ref 11.6–15.4)
WBC: 7.7 x10E3/uL (ref 3.4–10.8)

## 2024-07-04 NOTE — Progress Notes (Unsigned)
 Cardiology Office Note:  .   Date:  07/05/2024  ID:  Perry Lewis, DOB [birth year], MRN [id number] PCP: Perry Bernardino MATSU, MD   HeartCare Providers Cardiologist:  Perry ONEIDA Decent, MD   History of Present Illness: .    Chief Complaint  Patient presents with   Follow-up    Perry Lewis is a 58 y.o. male with below history who presents for follow-up.    History of Present Illness   Perry Lewis is a 58 year old male with a history of mitral valve regurgitation status post mechanical mitral valve replacement who presents for follow-up.  He underwent a mechanical mitral valve replacement in May of this year at [facility] after a failed mitral valve repair. Since the surgery, he has experienced significant improvement in symptoms such as shortness of breath and difficulty bending over and standing up, which have resolved.  He is currently on Coumadin  and aspirin  therapy. He initially found the mechanical valve's clicking sound challenging but has since become accustomed to it. He continues to exercise three days a week, including treadmill workouts with an aggressive slope, and has resumed lower body exercises post-surgery without any tenderness.  Post-surgery, he stayed in recovery a few extra days due to challenges in stabilizing his INR, which is now maintained between 2.5 to 3.5. He manages his diet to accommodate Coumadin  therapy.  He experienced a weight fluctuation post-surgery, initially gaining 20 pounds of fluid weight, which he subsequently lost, dropping 12 pounds below his pre-surgical weight before regaining it. He also had a blood transfusion post-operatively.  He reports no chest pain or trouble breathing. He reports no other health conditions.           Problem List 1. Severe mitral regurgitation -s/p valvuloplasty/34 mm annuloplasty ring (12/15/2020 Perry Lewis) -CCTA normal (10/07/2020) -Re-do MV replacement 27 mm On-X 12/28/2023 -INR 2.5-3.5 2.  RA 3. HLD -T chol 216, LDL 141, TG 163, HDL 42 4. Postoperative atrial fibrillation  -12/31/2020 5. RA    ROS: All other ROS reviewed and negative. Pertinent positives noted in the HPI.     Studies Reviewed: SABRA       TTE 05/09/2024  1. Left ventricular ejection fraction, by estimation, is 60 to 65%. The  left ventricle has normal function. The left ventricle has no regional  wall motion abnormalities.   2. Right ventricular systolic function is normal. The right ventricular  size is mildly enlarged.   3. Left atrial size was mildly dilated.   4. S/p 37 mm On-X mechanical mitral valve on 11/2023. Well seated with  normal leaflet motion and MG 5.00 mg at HR 79. Unable to assess for  regurgitation due to shadowing. There is a subvalvular mobile echodensity  in the LV, likely representing ruptured  chord from surgery. . The mitral valve is normal in structure.   5. The aortic valve is normal in structure. Aortic valve regurgitation is  not visualized. No aortic stenosis is present.   6. The inferior vena cava is normal in size with <50% respiratory  variability, suggesting right atrial pressure of 8 mmHg.   Physical Exam:   VS:  BP 138/82   Pulse 82   Ht 5' 9 (1.753 m)   Wt 170 lb (77.1 kg)   SpO2 97%   BMI 25.10 kg/m    Wt Readings from Last 3 Encounters:  07/05/24 170 lb (77.1 kg)  04/04/24 164 lb (74.4 kg)  01/23/24 160 lb (72.6 kg)  GEN: Well nourished, well developed in no acute distress NECK: No JVD; No carotid bruits CARDIAC: RRR, Mechanical S1, no murmurs, rubs, gallops RESPIRATORY:  Clear to auscultation without rales, wheezing or rhonchi  ABDOMEN: Soft, non-tender, non-distended EXTREMITIES:  No edema; No deformity  ASSESSMENT AND PLAN: .   Assessment and Plan    Mechanical mitral valve replacement for mitral regurgitation Status post mechanical mitral valve replacement in May 2025. Significant symptom improvement with no current chest pain or dyspnea. Valve  functioning well with no dysfunction or complications. Recovery complete. Onyx valve expected to last indefinitely with proper anticoagulation. - INR goal 2.5-3.5.  - On ASA 81 mg daily.  - Continue current exercise regimen, avoiding high-risk activities. - Ensure antibiotics for dental procedures. - Scheduled annual follow-up visit.                Follow-up: Return in about 1 year (around 07/05/2025).  Signed, Perry DASEN. Barbaraann, MD, [facility]  [facility]  [address] [place], [place] [address] [phone]  8:00 PM

## 2024-07-05 ENCOUNTER — Ambulatory Visit: Attending: Cardiovascular Disease | Admitting: Cardiovascular Disease

## 2024-07-05 ENCOUNTER — Encounter: Payer: Self-pay | Admitting: Cardiovascular Disease

## 2024-07-05 VITALS — BP 138/82 | HR 82 | Ht 69.0 in | Wt 170.0 lb

## 2024-07-05 DIAGNOSIS — Z952 Presence of prosthetic heart valve: Secondary | ICD-10-CM

## 2024-07-05 NOTE — Patient Instructions (Signed)
 Medication Instructions:  Your physician recommends that you continue on your current medications as directed. Please refer to the Current Medication list given to you today.  *If you need a refill on your cardiac medications before your next appointment, please call your pharmacy*  Lab Work: NONE ORDERED If you have labs (blood work) drawn today and your tests are completely normal, you will receive your results only by: MyChart Message (if you have MyChart) OR A paper copy in the mail If you have any lab test that is abnormal or we need to change your treatment, we will call you to review the results.  Testing/Procedures: NONE ORDERED  Follow-Up: At [facility], you and your health needs are our priority.  As part of our continuing mission to provide you with exceptional heart care, our providers are all part of one team.  This team includes your primary Cardiologist (physician) and Advanced Practice Providers or APPs (Physician Assistants and Nurse Practitioners) who all work together to provide you with the care you need, when you need it.  Your next appointment:   1 year(s)  Provider:   Darryle ONEIDA Decent, MD   We recommend signing up for the patient portal called MyChart.  Sign up information is provided on this After Visit Summary.  MyChart is used to connect with patients for Virtual Visits (Telemedicine).  Patients are able to view lab/test results, encounter notes, upcoming appointments, etc.  Non-urgent messages can be sent to your provider as well.   To learn more about what you can do with MyChart, go to [email or web address].

## 2024-07-10 ENCOUNTER — Ambulatory Visit (INDEPENDENT_AMBULATORY_CARE_PROVIDER_SITE_OTHER)

## 2024-07-10 DIAGNOSIS — Z7901 Long term (current) use of anticoagulants: Secondary | ICD-10-CM

## 2024-07-10 LAB — POCT INR: INR: 2.6 (ref 2.0–3.0)

## 2024-07-10 NOTE — Progress Notes (Signed)
 Indication; mechanical mitral valve   Pt reported he had apt with cardiologist on 12/5 and there was some discussion concerning his INR range and whether it should be 2.5-3.5. Advised pt this is the normal INR for mechanical mitral valve but if cardiology recommends a different INR to let the coumadin  clinic know. Pt verbalized understanding. Review of cardiology note did not reveal any change to INR range. At this time INR range will remain the same.  Methotrexate dose was increased about 3 wks ago per pt. This has an interaction with warfarin and can increase INR.  Continue 1 1/2 tablets (10 mg) daily except take 2 tablets on Tuesday, Thursday and Saturday. Recheck in 4 weeks at [place]. Pt reports he does not need a printed AVS. He will use mychart for dosing calendar.

## 2024-07-10 NOTE — Patient Instructions (Addendum)
 Pre visit review using our clinic review tool, if applicable. No additional management support is needed unless otherwise documented below in the visit note.  Continue 1 1/2 tablets (10 mg) daily except take 2 tablets on Tuesday, Thursday and Saturday. Recheck in 4 weeks at [place].

## 2024-07-17 ENCOUNTER — Ambulatory Visit

## 2024-07-17 ENCOUNTER — Other Ambulatory Visit: Payer: Self-pay | Admitting: Cardiovascular Disease

## 2024-07-26 DIAGNOSIS — M0579 Rheumatoid arthritis with rheumatoid factor of multiple sites without organ or systems involvement: Secondary | ICD-10-CM | POA: Diagnosis not present

## 2024-07-26 DIAGNOSIS — Z79899 Other long term (current) drug therapy: Secondary | ICD-10-CM | POA: Diagnosis not present

## 2024-08-07 ENCOUNTER — Ambulatory Visit

## 2024-08-07 DIAGNOSIS — Z7901 Long term (current) use of anticoagulants: Secondary | ICD-10-CM | POA: Diagnosis not present

## 2024-08-07 LAB — POCT INR: INR: 2.5 (ref 2.0–3.0)

## 2024-08-07 NOTE — Patient Instructions (Addendum)
 Pre visit review using our clinic review tool, if applicable. No additional management support is needed unless otherwise documented below in the visit note.  Continue 1 1/2 tablets (10 mg) daily except take 2 tablets on Tuesday, Thursday and Saturday. Recheck in 4 weeks at [place].

## 2024-08-07 NOTE — Progress Notes (Signed)
 Indication; mechanical mitral valve   Pt missed a dose two weeks ago. Pt also reports he would like to start protein shakes again but will only use them 3 days a week since he had more than that in the past and warfarin dosing was difficult to manage due to warfarin binding with proteins. Will recheck INR in 2 weeks.  Continue 1 1/2 tablets (10 mg) daily except take 2 tablets on Tuesday, Thursday and Saturday. Recheck in 4 weeks at [place]. Pt reports he does not need a printed AVS. He will use mychart for dosing calendar.

## 2024-08-19 ENCOUNTER — Other Ambulatory Visit: Payer: Self-pay | Admitting: Internal Medicine

## 2024-08-19 DIAGNOSIS — Z7901 Long term (current) use of anticoagulants: Secondary | ICD-10-CM

## 2024-08-20 NOTE — Telephone Encounter (Signed)
 Pt called to request a refill of warfarin.  Pt is compliant with warfarin management and PCP apts.  Sent in refill of warfarin to requested pharmacy.

## 2024-08-21 ENCOUNTER — Ambulatory Visit

## 2024-09-04 ENCOUNTER — Ambulatory Visit

## 2024-09-04 DIAGNOSIS — Z7901 Long term (current) use of anticoagulants: Secondary | ICD-10-CM | POA: Diagnosis not present

## 2024-09-04 LAB — POCT INR: INR: 2.9 (ref 2.0–3.0)

## 2024-09-04 NOTE — Patient Instructions (Addendum)
 Pre visit review using our clinic review tool, if applicable. No additional management support is needed unless otherwise documented below in the visit note.  Continue 1 1/2 tablets (10 mg) daily except take 2 tablets on Tuesday, Thursday and Saturday. Recheck in 4 weeks at [place].

## 2024-09-04 NOTE — Progress Notes (Signed)
 Indication; mechanical mitral valve   Pt reported he would like to start protein shakes again after the last INR check but would reduce the number of shakes to 3 times per week due to difficulty regulating his INR when he was taking them daily. He reports today that he decided not to restart the protein shakes at this time. He will let the coumadin  clinic know if he wants to restart.  Continue 1 1/2 tablets (10 mg) daily except take 2 tablets on Tuesday, Thursday and Saturday. Recheck in 4 weeks at [place]. Pt reports he does not need a printed AVS. He will use mychart for dosing calendar.

## 2024-10-02 ENCOUNTER — Ambulatory Visit
